# Patient Record
Sex: Female | Born: 2000
Health system: Southern US, Community
[De-identification: ages and names within clinical notes are randomized; demographics above are authoritative.]

## PROBLEM LIST (undated history)

## (undated) DIAGNOSIS — F419 Anxiety disorder, unspecified: Secondary | ICD-10-CM

## (undated) DIAGNOSIS — E559 Vitamin D deficiency, unspecified: Secondary | ICD-10-CM

## (undated) DIAGNOSIS — G44219 Episodic tension-type headache, not intractable: Secondary | ICD-10-CM

## (undated) DIAGNOSIS — G43009 Migraine without aura, not intractable, without status migrainosus: Secondary | ICD-10-CM

## (undated) DIAGNOSIS — F329 Major depressive disorder, single episode, unspecified: Secondary | ICD-10-CM

## (undated) DIAGNOSIS — F32A Depression, unspecified: Secondary | ICD-10-CM

## (undated) DIAGNOSIS — G4700 Insomnia, unspecified: Secondary | ICD-10-CM

## (undated) HISTORY — DX: Insomnia, unspecified: G47.00

## (undated) HISTORY — DX: Anxiety disorder, unspecified: F41.9

## (undated) HISTORY — DX: Migraine without aura, not intractable, without status migrainosus: G43.009

## (undated) HISTORY — DX: Vitamin D deficiency, unspecified: E55.9

## (undated) HISTORY — DX: Episodic tension-type headache, not intractable: G44.219

## (undated) HISTORY — DX: Depression, unspecified: F32.A

## (undated) HISTORY — PX: WISDOM TOOTH EXTRACTION: SHX21

---
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
## 2018-01-01 ENCOUNTER — Encounter (INDEPENDENT_AMBULATORY_CARE_PROVIDER_SITE_OTHER): Payer: Self-pay | Admitting: Family

## 2018-01-01 DIAGNOSIS — R4789 Other speech disturbances: Secondary | ICD-10-CM | POA: Insufficient documentation

## 2018-01-01 DIAGNOSIS — R4189 Other symptoms and signs involving cognitive functions and awareness: Secondary | ICD-10-CM | POA: Insufficient documentation

## 2018-01-01 DIAGNOSIS — R479 Unspecified speech disturbances: Secondary | ICD-10-CM | POA: Insufficient documentation

## 2018-01-06 ENCOUNTER — Telehealth: Payer: Self-pay | Admitting: Family

## 2018-01-06 ENCOUNTER — Encounter: Payer: Self-pay | Admitting: Psychology

## 2018-01-06 DIAGNOSIS — F410 Panic disorder [episodic paroxysmal anxiety] without agoraphobia: Secondary | ICD-10-CM

## 2018-01-06 DIAGNOSIS — R479 Unspecified speech disturbances: Secondary | ICD-10-CM

## 2018-01-06 DIAGNOSIS — R4789 Other speech disturbances: Secondary | ICD-10-CM

## 2018-01-06 DIAGNOSIS — R4189 Other symptoms and signs involving cognitive functions and awareness: Secondary | ICD-10-CM

## 2018-01-06 DIAGNOSIS — F411 Generalized anxiety disorder: Secondary | ICD-10-CM

## 2018-01-06 NOTE — Telephone Encounter (Signed)
°  Who's calling (name and relationship to patient) : Ayleah, Hofmeister (Self)  Best contact number: [phone] Judie Petit)  Provider they see: Inetta Fermo  Reason for call: Patient states that provider referred her to [facility] for testing however the times offered to her for testing is not convenient for her. She would like to know what other options she has.

## 2018-01-06 NOTE — Telephone Encounter (Signed)
L/M informing Vanessa Doyle that Vanessa Doyle is out of the office until Tuesday of next week. Informed her that Vanessa Doyle will return her call then. Invited her to call back with any other questions or concerns

## 2018-01-18 DIAGNOSIS — M9903 Segmental and somatic dysfunction of lumbar region: Secondary | ICD-10-CM | POA: Diagnosis not present

## 2018-01-18 DIAGNOSIS — M9901 Segmental and somatic dysfunction of cervical region: Secondary | ICD-10-CM | POA: Diagnosis not present

## 2018-01-18 DIAGNOSIS — M9902 Segmental and somatic dysfunction of thoracic region: Secondary | ICD-10-CM | POA: Diagnosis not present

## 2018-01-21 NOTE — Telephone Encounter (Signed)
I called and talked with Vanessa Doyle. She said that she was offered an appointment by Neurology for November and that the only days that they had openings were class days for her. She asked for referral elsewhere that she might be seen sooner and have more dates available. I will [facility] to [facility]. TG

## 2018-01-21 NOTE — Telephone Encounter (Signed)
°  Who's calling (name and relationship to patient) : Herma CarsonMoffitt, Keylani C (Self)  Best contact number: [phone] Judie Petit(M)  Provider they see: Inetta Fermoina  Reason for call: Requesting to speak with provider in regards to concerns below

## 2018-02-15 DIAGNOSIS — M79642 Pain in left hand: Secondary | ICD-10-CM | POA: Diagnosis not present

## 2018-02-19 DIAGNOSIS — M9902 Segmental and somatic dysfunction of thoracic region: Secondary | ICD-10-CM | POA: Diagnosis not present

## 2018-02-19 DIAGNOSIS — M9901 Segmental and somatic dysfunction of cervical region: Secondary | ICD-10-CM | POA: Diagnosis not present

## 2018-02-19 DIAGNOSIS — M9903 Segmental and somatic dysfunction of lumbar region: Secondary | ICD-10-CM | POA: Diagnosis not present

## 2018-03-16 ENCOUNTER — Ambulatory Visit: Payer: 59 | Admitting: Clinical

## 2018-03-23 DIAGNOSIS — R200 Anesthesia of skin: Secondary | ICD-10-CM | POA: Diagnosis not present

## 2018-03-29 DIAGNOSIS — M9902 Segmental and somatic dysfunction of thoracic region: Secondary | ICD-10-CM | POA: Diagnosis not present

## 2018-03-29 DIAGNOSIS — M9901 Segmental and somatic dysfunction of cervical region: Secondary | ICD-10-CM | POA: Diagnosis not present

## 2018-03-29 DIAGNOSIS — M9903 Segmental and somatic dysfunction of lumbar region: Secondary | ICD-10-CM | POA: Diagnosis not present

## 2018-04-09 DIAGNOSIS — L0390 Cellulitis, unspecified: Secondary | ICD-10-CM | POA: Diagnosis not present

## 2018-04-11 ENCOUNTER — Encounter (HOSPITAL_COMMUNITY): Payer: Self-pay | Admitting: Emergency Medicine

## 2018-04-11 ENCOUNTER — Other Ambulatory Visit: Payer: Self-pay

## 2018-04-11 ENCOUNTER — Emergency Department (HOSPITAL_COMMUNITY)
Admission: EM | Admit: 2018-04-11 | Discharge: 2018-04-11 | Disposition: A | Discharge status: Home / Self Care | Payer: 59 | Source: Home / Self Care | Attending: Emergency Medicine | Admitting: Emergency Medicine

## 2018-04-11 DIAGNOSIS — L03115 Cellulitis of right lower limb: Secondary | ICD-10-CM | POA: Insufficient documentation

## 2018-04-11 DIAGNOSIS — Z79899 Other long term (current) drug therapy: Secondary | ICD-10-CM

## 2018-04-11 LAB — CBC WITH DIFFERENTIAL/PLATELET
Abs Immature Granulocytes: 0 10*3/uL (ref 0.0–0.1)
Basophils Absolute: 0 10*3/uL (ref 0.0–0.1)
Basophils Relative: 0 %
EOS PCT: 2 %
Eosinophils Absolute: 0.2 10*3/uL (ref 0.0–0.7)
HCT: 39.2 % (ref 36.0–46.0)
Hemoglobin: 12.8 g/dL (ref 12.0–15.0)
Immature Granulocytes: 0 %
LYMPHS PCT: 15 %
Lymphs Abs: 1.6 10*3/uL (ref 0.7–4.0)
MCH: 28.4 pg (ref 26.0–34.0)
MCHC: 32.7 g/dL (ref 30.0–36.0)
MCV: 86.9 fL (ref 78.0–100.0)
MONO ABS: 0.8 10*3/uL (ref 0.1–1.0)
Monocytes Relative: 8 %
Neutro Abs: 7.8 10*3/uL — ABNORMAL HIGH (ref 1.7–7.7)
Neutrophils Relative %: 75 %
Platelets: 225 10*3/uL (ref 150–400)
RBC: 4.51 MIL/uL (ref 3.87–5.11)
RDW: 12.8 % (ref 11.5–15.5)
WBC: 10.4 10*3/uL (ref 4.0–10.5)

## 2018-04-11 LAB — COMPREHENSIVE METABOLIC PANEL
ALT: 17 U/L (ref 0–44)
AST: 16 U/L (ref 15–41)
Albumin: 3.7 g/dL (ref 3.5–5.0)
Alkaline Phosphatase: 78 U/L (ref 38–126)
Anion gap: 9 (ref 5–15)
BUN: 8 mg/dL (ref 6–20)
CALCIUM: 9.1 mg/dL (ref 8.9–10.3)
CO2: 23 mmol/L (ref 22–32)
Chloride: 106 mmol/L (ref 98–111)
Creatinine, Ser: 0.65 mg/dL (ref 0.44–1.00)
GFR calc Af Amer: >60 mL/min (ref >60)
GLUCOSE: 97 mg/dL (ref 70–99)
Potassium: 3.9 mmol/L (ref 3.5–5.1)
SODIUM: 138 mmol/L (ref 135–145)
TOTAL PROTEIN: 7.2 g/dL (ref 6.5–8.1)
Total Bilirubin: 0.5 mg/dL (ref 0.3–1.2)

## 2018-04-11 LAB — I-STAT CG4 LACTIC ACID, ED: LACTIC ACID, VENOUS: 0.9 mmol/L (ref 0.5–1.9)

## 2018-04-11 LAB — I-STAT BETA HCG BLOOD, ED (MC, WL, AP ONLY)

## 2018-04-11 MED ORDER — MORPHINE SULFATE (PF) 4 MG/ML IV SOLN
4.0000 mg | Freq: Once | INTRAVENOUS | Status: DC
  Filled 2018-04-11: qty 1

## 2018-04-11 MED ORDER — IBUPROFEN 800 MG PO TABS
800.0000 mg | ORAL_TABLET | Freq: Once | ORAL | Status: AC
  Administered 2018-04-11: 800 mg via ORAL
  Filled 2018-04-11: qty 1

## 2018-04-11 MED ORDER — VANCOMYCIN HCL IN DEXTROSE 1-5 GM/200ML-% IV SOLN
1000.0000 mg | Freq: Once | INTRAVENOUS | Status: DC
  Filled 2018-04-11: qty 200

## 2018-04-11 MED ORDER — ONDANSETRON HCL 4 MG/2ML IJ SOLN
4.0000 mg | Freq: Once | INTRAMUSCULAR | Status: DC
  Filled 2018-04-11: qty 2

## 2018-04-11 NOTE — ED Notes (Signed)
Ortho tech at bedside 

## 2018-04-11 NOTE — ED Provider Notes (Signed)
Medical screening examination/treatment/procedure(s) were conducted as a shared visit with non-physician practitioner(s) and myself.  I personally evaluated the patient during the encounter.  Patient diagnosed with cellulitis and started on antibiotics Friday morning.  She is had 7 doses of 300 mg of clindamycin and that time.  The redness has improved but she had worsening swelling and pain over the last 2 days the point she is having difficulty walking.  No fevers, nausea, vomiting.  She has had some body aches.  Sounds like the physician did a culture around the area and was positive for staph.  On my examination patient has market erythema around the wound on her right shin.  Area is warm and tender and edematous.  She has no posterior tenderness or significant calf pain with range of motion of her ankle.  Her heart rate is normal.  She is afebrile.  She overall appears well. Gust that it was still relatively early in the course of antibiotics at 2-1/2 days.  Also discussed that she may have any significant risk factors for significant worsening however she was also in the.  In between route expect the symptoms to slow down and before starting to resolve.  With this information I did not think the patient necessarily required admission to the hospital for failed antibiotics as she has had a full 3 days yet however offered them observation to make sure that it was get better before going home.  I discussed the pain control is the main issue so if we can get that under control they were willing to wait longer at home before being admitted to the hospital if necessary.  None     Kayla Weekes, Barbara Cower, MD 04/14/18 [phone]

## 2018-04-11 NOTE — ED Notes (Signed)
Patient verbalizes understanding of discharge instructions. Opportunity for questioning and answers were provided. Armband removed by staff, pt discharged from ED on crutches.  

## 2018-04-11 NOTE — ED Triage Notes (Addendum)
Pt had a bug bite on her right ankle. On Friday the area became red and swollen. She is on Clindamycin since Friday, with bacitracin ointment. They redness has spread and the surrounding tissue is swollen with scab like area noted. Pt also has pain when walking now. Pt was called by MD who did a wound culture and was told her wound was positive for Staff.

## 2018-04-11 NOTE — Progress Notes (Signed)
Orthopedic Tech Progress Note Patient Details:  Vanessa Doyle 05/01/2001 [id number]  Ortho Devices Type of Ortho Device: Crutches Ortho Device/Splint Interventions: Application   Post Interventions Patient Tolerated: Well Instructions Provided: Care of device   Nikki Dom 04/11/2018, 4:30 PM

## 2018-04-11 NOTE — ED Provider Notes (Signed)
[facility] EMERGENCY DEPARTMENT Provider Note   CSN: [id number] Arrival date & time: 04/11/18  1224     History   Chief Complaint Chief Complaint  Patient presents with  . Wound Check  . Cellulitis    HPI Vanessa Doyle is a 17 y.o. female who presents the emergency department for evaluation of right leg wound.  The patient saw her pediatrician on Friday April 09, 2018.  She had 2 areas of ulcerated tissue on the right lower extremity with some erythema, tenderness and surrounding petechial or violaceous tissue on a picture she showed me.  There was also some lymphangitis.  She was started on clindamycin and has had 7 doses.  She is having worsening erythema, swelling of the leg and is now unable to ambulate on the leg.  She has not had any fevers or chills.  She is never had anything like this before.  She is unsure if she was bitten by anything.  HPI  Past Medical History:  Diagnosis Date  . Episodic tension type headache 11/24/2014  . Migraine without aura and without status migrainosus, not intractable 11/24/2014  . Migraine without aura and without status migrainosus, not intractable     Patient Active Problem List   Diagnosis Date Noted  . Difficulty with speech 01/01/2018  . Word finding difficulty 01/01/2018  . Subjective memory complaints 01/01/2018  . Panic disorder 09/19/2017  . Hypotension 01/31/2015  . Anxiety state 11/24/2014  . Migraine without aura and without status migrainosus, not intractable 11/24/2014  . Episodic tension type headache 11/24/2014    No past surgical history on file.   OB History   None      Home Medications    Prior to Admission medications   Medication Sig Start Date End Date Taking? Authorizing Provider  acetaminophen (TYLENOL) 500 MG tablet Take 1,000 mg by mouth every 6 (six) hours as needed for mild pain or headache.    [provider]  diphenhydrAMINE (BENADRYL) 25 MG tablet Take 25 mg by mouth  every 6 (six) hours as needed.    [provider]  frovatriptan (FROVA) 2.5 MG tablet Take 2 tablets on day 1 of menstrual period. Then take 1 tablet twice per day on each successive day of menstrual period. 12/31/15   Elveria Rising, NP  ibuprofen (ADVIL,MOTRIN) 400 MG tablet Take 1 tablet (400 mg total) by mouth every 8 (eight) hours as needed for moderate pain. 05/09/17   Doreene Eland, MD  ketorolac (TORADOL) 10 MG tablet Take 2 tablets at onset, then take 1 tablet every 6 hours while awake and experiencing pain. 02/06/16   Elveria Rising, NP  promethazine (PHENERGAN) 25 MG tablet TAKE 1/2 TO 1 TABLET AT ONSET OF MIGRAINE WITH NAUSEA. MAY REPEAT IN 6 HOURS IF NEEDED 08/02/15   Elveria Rising, NP  tiZANidine (ZANAFLEX) 4 MG tablet Take 1 tablet at bedtime for pain. 12/06/15   Elveria Rising, NP  ZOMIG 5 MG nasal solution One spray into 1 nostril at onset of migraine 12/01/14   Elveria Rising, NP    Family History Family History  Problem Relation Age of Onset  . Heart disease Paternal Grandmother   . Migraines Father     Social History Social History   Tobacco Use  . Smoking status: Never Smoker  . Smokeless tobacco: Never Used  Substance Use Topics  . Alcohol use: No  . Drug use: No     Allergies   Patient has no  known allergies.   Review of Systems Review of Systems Ten systems reviewed and are negative for acute change, except as noted in the HPI.    Physical Exam Updated Vital Signs BP 123/66   Pulse 98   Temp 99.4 F (37.4 C)   Resp 17   LMP 03/18/2018   SpO2 98%   Physical Exam  Constitutional: She is oriented to person, place, and time. She appears well-developed and well-nourished. No distress.  HENT:  Head: Normocephalic and atraumatic.  Eyes: Conjunctivae are normal. No scleral icterus.  Neck: Normal range of motion.  Cardiovascular: Normal rate, regular rhythm and normal heart sounds. Exam reveals no gallop and no friction rub.    No murmur heard. Pulmonary/Chest: Effort normal and breath sounds normal. No respiratory distress.  Abdominal: Soft. Bowel sounds are normal. She exhibits no distension and no mass. There is no tenderness. There is no guarding.  Musculoskeletal:  Right lower extremity is exquisitely tender with swelling of the ankle, well demarcated erythema extending up to the inside of the medial knee without lymphangitis.  There is central crusting lesion over the middle of the infected area without palpable fluctuance.  Informal bedside ultrasound is negative for fluid collection and shows only cellulitic changes.  Neurological: She is alert and oriented to person, place, and time.  Skin: Skin is warm and dry. She is not diaphoretic.  Psychiatric: Her behavior is normal.  Nursing note and vitals reviewed.    ED Treatments / Results  Labs (all labs ordered are listed, but only abnormal results are displayed) Labs Reviewed  CBC WITH DIFFERENTIAL/PLATELET - Abnormal; Notable for the following components:      Result Value   Neutro Abs 7.8 (*)    All other components within normal limits  COMPREHENSIVE METABOLIC PANEL  I-STAT CG4 LACTIC ACID, ED  I-STAT BETA HCG BLOOD, ED ([facility], [facility], [facility] ONLY)  I-STAT CG4 LACTIC ACID, ED    EKG None  Radiology No results found.  Procedures Procedures (including critical care time)  Medications Ordered in ED Medications - No data to display   Initial Impression / Assessment and Plan / ED Course  I have reviewed the triage vital signs and the nursing notes.  Pertinent labs & imaging results that were available during my care of the patient were reviewed by me and considered in my medical decision making (see chart for details).     Patient seen and shared visit with Dr. Clayborne Dana.  Given that she is had only 7 doses of clindamycin he does not feel that she would need inpatient admission.  She was given a dose of vancomycin here.  Patient's able to ambulate with  crutches.  She and her parents appear to be reliable and we feel in a shared decision that she may go home and continue to observe the area of erythema for the next 24 hours.  She should return immediately for any worsening in her symptoms after that time because at that point she may need admission to the hospital.  Otherwise she should follow-up with her primary care physician in the next 2 days.  She is afebrile she does not have an elevated white blood cell count.  She may continue on her oral clindamycin.  She appears appropriate for discharge at this time.  Return cautions discussed Final Clinical Impressions(s) / ED Diagnoses   Final diagnoses:  None    ED Discharge Orders    None       Arthor Captain,  PA-C 04/11/18 1920    Marily Memos, MD 04/14/18 [phone]

## 2018-04-11 NOTE — Discharge Instructions (Signed)
Contact a health care provider if: °You have a fever. °Your symptoms do not improve within 1-2 days of starting treatment. °Your bone or joint underneath the infected area becomes painful after the skin has healed. °Your infection returns in the same area or another area. °You notice a swollen bump in the infected area. °You develop new symptoms. °You have a general ill feeling (malaise) with muscle aches and pains. °Get help right away if: °Your symptoms get worse. °You feel very sleepy. °You develop vomiting or diarrhea that persists. °You notice red streaks coming from the infected area. °Your red area gets larger or turns dark in color. °

## 2018-04-13 ENCOUNTER — Encounter (HOSPITAL_COMMUNITY): Payer: Self-pay | Admitting: Emergency Medicine

## 2018-04-13 ENCOUNTER — Inpatient Hospital Stay (HOSPITAL_COMMUNITY)
Admission: EM | Admit: 2018-04-13 | Discharge: 2018-04-14 | DRG: 603 | Disposition: A | Discharge status: Home / Self Care | Payer: 59 | Attending: Internal Medicine | Admitting: Internal Medicine

## 2018-04-13 DIAGNOSIS — G4700 Insomnia, unspecified: Secondary | ICD-10-CM | POA: Diagnosis present

## 2018-04-13 DIAGNOSIS — G43009 Migraine without aura, not intractable, without status migrainosus: Secondary | ICD-10-CM | POA: Diagnosis not present

## 2018-04-13 DIAGNOSIS — M79609 Pain in unspecified limb: Secondary | ICD-10-CM | POA: Diagnosis not present

## 2018-04-13 DIAGNOSIS — Z8249 Family history of ischemic heart disease and other diseases of the circulatory system: Secondary | ICD-10-CM

## 2018-04-13 DIAGNOSIS — Z1629 Resistance to other single specified antibiotic: Secondary | ICD-10-CM | POA: Diagnosis present

## 2018-04-13 DIAGNOSIS — G44211 Episodic tension-type headache, intractable: Secondary | ICD-10-CM | POA: Diagnosis present

## 2018-04-13 DIAGNOSIS — Z9119 Patient's noncompliance with other medical treatment and regimen: Secondary | ICD-10-CM | POA: Diagnosis not present

## 2018-04-13 DIAGNOSIS — F411 Generalized anxiety disorder: Secondary | ICD-10-CM | POA: Diagnosis present

## 2018-04-13 DIAGNOSIS — M7989 Other specified soft tissue disorders: Secondary | ICD-10-CM | POA: Diagnosis not present

## 2018-04-13 DIAGNOSIS — L03115 Cellulitis of right lower limb: Principal | ICD-10-CM | POA: Diagnosis present

## 2018-04-13 DIAGNOSIS — G44219 Episodic tension-type headache, not intractable: Secondary | ICD-10-CM | POA: Diagnosis present

## 2018-04-13 DIAGNOSIS — B9561 Methicillin susceptible Staphylococcus aureus infection as the cause of diseases classified elsewhere: Secondary | ICD-10-CM | POA: Diagnosis not present

## 2018-04-13 DIAGNOSIS — L0390 Cellulitis, unspecified: Secondary | ICD-10-CM | POA: Diagnosis present

## 2018-04-13 LAB — COMPREHENSIVE METABOLIC PANEL
ALT: 17 U/L (ref 0–44)
ANION GAP: 9 (ref 5–15)
AST: 15 U/L (ref 15–41)
Albumin: 3.8 g/dL (ref 3.5–5.0)
Alkaline Phosphatase: 73 U/L (ref 38–126)
BUN: 15 mg/dL (ref 6–20)
CALCIUM: 9.5 mg/dL (ref 8.9–10.3)
CHLORIDE: 108 mmol/L (ref 98–111)
CO2: 25 mmol/L (ref 22–32)
Creatinine, Ser: 0.55 mg/dL (ref 0.44–1.00)
Glucose, Bld: 92 mg/dL (ref 70–99)
Potassium: 3.8 mmol/L (ref 3.5–5.1)
Sodium: 142 mmol/L (ref 135–145)
Total Bilirubin: 0.4 mg/dL (ref 0.3–1.2)
Total Protein: 7.2 g/dL (ref 6.5–8.1)

## 2018-04-13 LAB — CBC WITH DIFFERENTIAL/PLATELET
BASOS PCT: 0 %
Basophils Absolute: 0 10*3/uL (ref 0.0–0.1)
Eosinophils Absolute: 0.2 10*3/uL (ref 0.0–0.7)
Eosinophils Relative: 3 %
HCT: 37.4 % (ref 36.0–46.0)
HEMOGLOBIN: 12.6 g/dL (ref 12.0–15.0)
Lymphocytes Relative: 36 %
Lymphs Abs: 2.6 10*3/uL (ref 0.7–4.0)
MCH: 28.4 pg (ref 26.0–34.0)
MCHC: 33.7 g/dL (ref 30.0–36.0)
MCV: 84.2 fL (ref 78.0–100.0)
Monocytes Absolute: 0.4 10*3/uL (ref 0.1–1.0)
Monocytes Relative: 6 %
NEUTROS PCT: 55 %
Neutro Abs: 3.8 10*3/uL (ref 1.7–7.7)
Platelets: 305 10*3/uL (ref 150–400)
RBC: 4.44 MIL/uL (ref 3.87–5.11)
RDW: 13.1 % (ref 11.5–15.5)
WBC: 7.1 10*3/uL (ref 4.0–10.5)

## 2018-04-13 LAB — I-STAT CG4 LACTIC ACID, ED: LACTIC ACID, VENOUS: 1.27 mmol/L (ref 0.5–1.9)

## 2018-04-13 LAB — I-STAT BETA HCG BLOOD, ED (MC, WL, AP ONLY): I-stat hCG, quantitative: <5 m[IU]/mL (ref <5)

## 2018-04-13 MED ORDER — CEFAZOLIN SODIUM-DEXTROSE 1-4 GM/50ML-% IV SOLN
1.0000 g | Freq: Once | INTRAVENOUS | Status: AC
  Administered 2018-04-13: 1 g via INTRAVENOUS
  Filled 2018-04-13: qty 50

## 2018-04-13 MED ORDER — ONDANSETRON HCL 4 MG/2ML IJ SOLN
4.0000 mg | Freq: Once | INTRAMUSCULAR | Status: AC
  Administered 2018-04-13: 4 mg via INTRAVENOUS
  Filled 2018-04-13: qty 2

## 2018-04-13 MED ORDER — HYDROMORPHONE HCL 1 MG/ML IJ SOLN
0.5000 mg | Freq: Once | INTRAMUSCULAR | Status: AC
  Administered 2018-04-13: 0.5 mg via INTRAVENOUS
  Filled 2018-04-13: qty 1

## 2018-04-13 MED ORDER — VANCOMYCIN HCL IN DEXTROSE 1-5 GM/200ML-% IV SOLN
1000.0000 mg | Freq: Once | INTRAVENOUS | Status: DC
  Administered 2018-04-14: 1000 mg via INTRAVENOUS
  Filled 2018-04-13: qty 200

## 2018-04-13 MED ORDER — VANCOMYCIN HCL 10 G IV SOLR
2000.0000 mg | Freq: Once | INTRAVENOUS | Status: DC
  Filled 2018-04-13: qty 2000

## 2018-04-13 NOTE — H&P (Signed)
History and Physical    Vanessa Doyle ZOX:[id number] DOB: 09-Sep-2000 DOA: 04/13/2018  PCP: Maryellen Pile, MD   Patient coming from: Home  Chief Complaint: Leg pain, swelling  HPI: Vanessa Doyle is a 17 y.o. female with medical history significant for migraines, who presented to the ED with complaints of right lower extremity pain redness and swelling, for over a week duration.  Patient also reports subjective fevers.  She reports an initial bump, it was itchy, (?probably from a bug bite-bug actually not seen), which progressed to 2 wounds.  Reports purulent and will treat drainage from both wounds.  She denies vomiting, poor p.o. intake or diarrhea. Saw her PCP, 29/8/19, was prescribed clindamycin for cellulitis.  And also presented to the ED 04/11/2018 -charge to continue clindamycin as prescribed by PCP. But cultures obtained in PCP's office (appears superficial wound cultures)-grew staph resistant to clindamycin, and with persistent symptoms patient was told to come to the ED.  ED Course: Stable vitals.  WBC 7.1.  Lactic acid 1.2.  Blood cultures were obtained, IV vancomycin and cefazolin started in the ED. Hospitalist called to admit for cellulitis, after failure of outpatient treatment.  Review of Systems: As per HPI all other systems reviewed and negative.  Past Medical History:  Diagnosis Date  . Episodic tension type headache 11/24/2014  . Migraine without aura and without status migrainosus, not intractable 11/24/2014  . Migraine without aura and without status migrainosus, not intractable     History reviewed. No pertinent surgical history.   reports that she has never smoked. She has never used smokeless tobacco. She reports that she does not drink alcohol or use drugs.  No Known Allergies  Family History  Problem Relation Age of Onset  . Heart disease Paternal Grandmother   . Migraines Father     Prior to Admission medications   Medication Sig Start Date End Date  Taking? Authorizing Provider  BELSOMRA 10 MG TABS Take 10 mg by mouth at bedtime. 02/17/18  Yes [provider]  ibuprofen (ADVIL,MOTRIN) 200 MG tablet Take 200-400 mg by mouth every 6 (six) hours as needed for fever or moderate pain.   Yes [provider]  mupirocin ointment (BACTROBAN) 2 % Apply 1 application topically 3 (three) times daily as needed (for one week).  04/09/18  Yes [provider]  sulfamethoxazole-trimethoprim (BACTRIM DS,SEPTRA DS) 800-160 MG tablet Take 2 tablets by mouth 2 (two) times daily.  04/13/18  Yes [provider]  ibuprofen (ADVIL,MOTRIN) 400 MG tablet Take 1 tablet (400 mg total) by mouth every 8 (eight) hours as needed for moderate pain. Patient not taking: Reported on 04/13/2018 05/09/17   Doreene Eland, MD  ketorolac (TORADOL) 10 MG tablet Take 2 tablets at onset, then take 1 tablet every 6 hours while awake and experiencing pain. Patient not taking: Reported on 04/13/2018 02/06/16   Elveria Rising, NP  promethazine (PHENERGAN) 25 MG tablet TAKE 1/2 TO 1 TABLET AT ONSET OF MIGRAINE WITH NAUSEA. MAY REPEAT IN 6 HOURS IF NEEDED Patient not taking: Reported on 04/13/2018 08/02/15   Elveria Rising, NP  tiZANidine (ZANAFLEX) 4 MG tablet Take 1 tablet at bedtime for pain. Patient not taking: Reported on 04/13/2018 12/06/15   Elveria Rising, NP    Physical Exam: Vitals:   04/13/18 1853 04/13/18 1854  BP: 129/67   Pulse: 85   Resp: 17   Temp: 99.4 F (37.4 C)   TempSrc: Oral   SpO2: 99%   Weight:  90 kg  Height:  5' 3.25" (1.607 m)    Constitutional: NAD, calm, comfortable Vitals:   04/13/18 1853 04/13/18 1854  BP: 129/67   Pulse: 85   Resp: 17   Temp: 99.4 F (37.4 C)   TempSrc: Oral   SpO2: 99%   Weight:  90 kg  Height:  5' 3.25" (1.607 m)   Eyes: PERRL, lids and conjunctivae normal ENMT: Mucous membranes are moist. Posterior pharynx clear of any exudate or lesions.  Neck: normal, supple, no masses, no  thyromegaly Respiratory: clear to auscultation bilaterally, no wheezing, no crackles. Normal respiratory effort. No accessory muscle use.  Cardiovascular: Regular rate and rhythm, no murmurs / rubs / gallops.  Abdomen: no tenderness, no masses palpated. No hepatosplenomegaly. Bowel sounds positive.  Musculoskeletal: no clubbing / cyanosis. No joint deformity upper and lower extremities. Good ROM, no contractures. Skin:  2  ~2cm long wounds, scabbed over anterior right leg, without drainage of purulence, improving lower extremity erythema-demarcated but with persistent swelling (compared with pics on phone), tenderness Neurologic: CN 2-12 grossly intact. Strength 5/5 in all 4.  Psychiatric: Normal judgment and insight. Alert and oriented x 3. Normal mood.      Labs on Admission: I have personally reviewed following labs and imaging studies  CBC: Recent Labs  Lab 04/11/18 1239 04/13/18 2001  WBC 10.4 7.1  NEUTROABS 7.8* 3.8  HGB 12.8 12.6  HCT 39.2 37.4  MCV 86.9 84.2  PLT 225 305   Basic Metabolic Panel: Recent Labs  Lab 04/11/18 1239 04/13/18 2001  NA 138 142  K 3.9 3.8  CL 106 108  CO2 23 25  GLUCOSE 97 92  BUN 8 15  CREATININE 0.65 0.55  CALCIUM 9.1 9.5   Liver Function Tests: Recent Labs  Lab 04/11/18 1239 04/13/18 2001  AST 16 15  ALT 17 17  ALKPHOS 78 73  BILITOT 0.5 0.4  PROT 7.2 7.2  ALBUMIN 3.7 3.8   Urine analysis:    Component Value Date/Time   COLORURINE YELLOW 10/13/2013 0014   APPEARANCEUR CLEAR 10/13/2013 0014   LABSPEC 1.015 05/09/2017 1729   PHURINE 7.5 05/09/2017 1729   GLUCOSEU NEGATIVE 05/09/2017 1729   HGBUR NEGATIVE 05/09/2017 1729   BILIRUBINUR NEGATIVE 05/09/2017 1729   KETONESUR NEGATIVE 05/09/2017 1729   PROTEINUR NEGATIVE 05/09/2017 1729   UROBILINOGEN 0.2 05/09/2017 1729   NITRITE NEGATIVE 05/09/2017 1729   LEUKOCYTESUR NEGATIVE 05/09/2017 1729    Radiological Exams on Admission: No results found.  EKG:  None  Assessment/Plan Active Problems:   Migraine without aura and without status migrainosus, not intractable   Cellulitis  Cellulitis- likely 2/2 to wounds, with reported purulence, and subjective fevers.  WBC 7.1.  Lactic acid 1.2. Does not meet sepsis criteria. Failed outpt treatment with clindamycin. Bactrim prescribed today. IV Vanco and cefazolin started in ED. Cultures (likely from wound ) PCPs office-staph aureus resistant to clindamycin, sensitive to Bactrim tetracycline, vancomycin, Cipro, oxacillin. -Will continue with IV vancomycin only started in ED -Follow-up blood cultures drawn in ED -Rule out lower extremity DVT with venous Dopplers -BMP a.m, while on antibiotics  Migraines-stable.  Patient reports her prior antimigraine medications, including Frovotriptan have been discontinued. -Continue home Belsomra for insomnia  DVT prophylaxis: Lovenox Code Status: full Family Communication: Both parents father mother at bedside Disposition Plan: Per rounding team Consults called: None Admission status: Inpatient, MedSurg.    Onnie Boer MD [facility] Pager [phone] From 6PM-2AM.  Otherwise please contact night-coverage  [email or web address] Password [facility]  04/13/2018, 11:47 PM

## 2018-04-13 NOTE — ED Provider Notes (Signed)
[facility]-EMERGENCY DEPT Provider Note   CSN: [id number] Arrival date & time: 04/13/18  1827     History   Chief Complaint Chief Complaint  Patient presents with  . leg infection    HPI Vanessa Doyle is a 17 y.o. female.  Patient presents to ED for re-evaluation of right LE infection. Initially seen by her doctor Donnie Coffin) and started on Clindamycin around the 29th of August for cellulitis though secondary to an insect bite. She came to the emergency department on 9/1 after having a total of 6 doses of Clinda with worsening symptoms of swelling and spreading erythema proximally. She had normal VS and labs at that time and was sent home to continue Clindamycin. She has maintained a low grade temperature throughout infection (99 to 100). She has had nausea without vomiting. She was contacted today by Dr. Renelda Loma office informing her that her cultures grew a Clindamycin resistant staph and to return to the hospital. Today she states the pain has gotten worse.  The history is provided by the patient and a parent.    Past Medical History:  Diagnosis Date  . Episodic tension type headache 11/24/2014  . Migraine without aura and without status migrainosus, not intractable 11/24/2014  . Migraine without aura and without status migrainosus, not intractable     Patient Active Problem List   Diagnosis Date Noted  . Difficulty with speech 01/01/2018  . Word finding difficulty 01/01/2018  . Subjective memory complaints 01/01/2018  . Panic disorder 09/19/2017  . Hypotension 01/31/2015  . Anxiety state 11/24/2014  . Migraine without aura and without status migrainosus, not intractable 11/24/2014  . Episodic tension type headache 11/24/2014    History reviewed. No pertinent surgical history.   OB History   None      Home Medications    Prior to Admission medications   Medication Sig Start Date End Date Taking? Authorizing Provider  BELSOMRA 10 MG TABS Take 10  mg by mouth at bedtime. 02/17/18  Yes [provider]  ibuprofen (ADVIL,MOTRIN) 200 MG tablet Take 200-400 mg by mouth every 6 (six) hours as needed for fever or moderate pain.   Yes [provider]  mupirocin ointment (BACTROBAN) 2 % Apply 1 application topically 3 (three) times daily as needed (for one week).  04/09/18  Yes [provider]  sulfamethoxazole-trimethoprim (BACTRIM DS,SEPTRA DS) 800-160 MG tablet Take 2 tablets by mouth 2 (two) times daily.  04/13/18  Yes [provider]  frovatriptan (FROVA) 2.5 MG tablet Take 2 tablets on day 1 of menstrual period. Then take 1 tablet twice per day on each successive day of menstrual period. Patient not taking: Reported on 04/13/2018 12/31/15   Elveria Rising, NP  ibuprofen (ADVIL,MOTRIN) 400 MG tablet Take 1 tablet (400 mg total) by mouth every 8 (eight) hours as needed for moderate pain. Patient not taking: Reported on 04/13/2018 05/09/17   Doreene Eland, MD  ketorolac (TORADOL) 10 MG tablet Take 2 tablets at onset, then take 1 tablet every 6 hours while awake and experiencing pain. Patient not taking: Reported on 04/13/2018 02/06/16   Elveria Rising, NP  promethazine (PHENERGAN) 25 MG tablet TAKE 1/2 TO 1 TABLET AT ONSET OF MIGRAINE WITH NAUSEA. MAY REPEAT IN 6 HOURS IF NEEDED Patient not taking: Reported on 04/13/2018 08/02/15   Elveria Rising, NP  tiZANidine (ZANAFLEX) 4 MG tablet Take 1 tablet at bedtime for pain. Patient not taking: Reported on 04/13/2018 12/06/15   Elveria Rising, NP  ZOMIG 5 MG nasal solution One spray into 1 nostril at onset of migraine Patient not taking: Reported on 04/13/2018 12/01/14   Elveria Rising, NP    Family History Family History  Problem Relation Age of Onset  . Heart disease Paternal Grandmother   . Migraines Father     Social History Social History   Tobacco Use  . Smoking status: Never Smoker  . Smokeless tobacco: Never Used  Substance Use Topics  . Alcohol  use: No  . Drug use: No     Allergies   Patient has no known allergies.   Review of Systems Review of Systems  Constitutional: Positive for fever.  Respiratory: Negative.   Cardiovascular: Negative.   Gastrointestinal: Positive for nausea. Negative for vomiting.  Musculoskeletal:       See HPI.  Skin: Positive for color change and wound.  Neurological: Negative.      Physical Exam Updated Vital Signs BP 129/67 (BP Location: Left Arm)   Pulse 85   Temp 99.4 F (37.4 C) (Oral)   Resp 17   Ht 5' 3.25" (1.607 m)   Wt 90 kg   LMP 03/18/2018   SpO2 99%   BMI 34.86 kg/m   Physical Exam  Constitutional: She is oriented to person, place, and time. She appears well-developed and well-nourished.  HENT:  Head: Normocephalic.  Neck: Normal range of motion. Neck supple.  Cardiovascular: Normal rate.  Pulmonary/Chest: Effort normal.  Abdominal: Soft. Bowel sounds are normal. There is no tenderness. There is no rebound and no guarding.  Musculoskeletal: Normal range of motion.  Right lower leg swollen anteriorly extending to foot and surrounding wound on distal shin. No drainage or bleeding from the wound. Erythema extends to proximal lower leg. There is significant tenderness to lower leg and foot.   Neurological: She is alert and oriented to person, place, and time. No sensory deficit.  Skin: Skin is warm and dry. There is erythema.  Psychiatric: She has a normal mood and affect.     ED Treatments / Results  Labs (all labs ordered are listed, but only abnormal results are displayed) Labs Reviewed  CULTURE, BLOOD (ROUTINE X 2)  CULTURE, BLOOD (ROUTINE X 2)  COMPREHENSIVE METABOLIC PANEL  CBC WITH DIFFERENTIAL/PLATELET  I-STAT CG4 LACTIC ACID, ED  I-STAT BETA HCG BLOOD, ED ([facility], [facility], [facility] ONLY)  I-STAT CG4 LACTIC ACID, ED   Results for orders placed or performed during the hospital encounter of 04/13/18  Comprehensive metabolic panel  Result Value Ref Range   Sodium 142  135 - 145 mmol/L   Potassium 3.8 3.5 - 5.1 mmol/L   Chloride 108 98 - 111 mmol/L   CO2 25 22 - 32 mmol/L   Glucose, Bld 92 70 - 99 mg/dL   BUN 15 6 - 20 mg/dL   Creatinine, Ser 2.95 0.44 - 1.00 mg/dL   Calcium 9.5 8.9 - 62.1 mg/dL   Total Protein 7.2 6.5 - 8.1 g/dL   Albumin 3.8 3.5 - 5.0 g/dL   AST 15 15 - 41 U/L   ALT 17 0 - 44 U/L   Alkaline Phosphatase 73 38 - 126 U/L   Total Bilirubin 0.4 0.3 - 1.2 mg/dL   GFR calc non Af Amer >60 >60 mL/min   GFR calc Af Amer >60 >60 mL/min   Anion gap 9 5 - 15  CBC with Differential  Result Value Ref Range   WBC 7.1 4.0 - 10.5 K/uL   RBC 4.44 3.87 -  5.11 MIL/uL   Hemoglobin 12.6 12.0 - 15.0 g/dL   HCT 61.4 70.9 - 29.5 %   MCV 84.2 78.0 - 100.0 fL   MCH 28.4 26.0 - 34.0 pg   MCHC 33.7 30.0 - 36.0 g/dL   RDW 74.7 34.0 - 37.0 %   Platelets 305 150 - 400 K/uL   Neutrophils Relative % 55 %   Neutro Abs 3.8 1.7 - 7.7 K/uL   Lymphocytes Relative 36 %   Lymphs Abs 2.6 0.7 - 4.0 K/uL   Monocytes Relative 6 %   Monocytes Absolute 0.4 0.1 - 1.0 K/uL   Eosinophils Relative 3 %   Eosinophils Absolute 0.2 0.0 - 0.7 K/uL   Basophils Relative 0 %   Basophils Absolute 0.0 0.0 - 0.1 K/uL  I-Stat CG4 Lactic Acid, ED  Result Value Ref Range   Lactic Acid, Venous 1.27 0.5 - 1.9 mmol/L  I-Stat beta hCG blood, ED  Result Value Ref Range   I-stat hCG, quantitative <5.0 <5 mIU/mL   Comment 3            EKG None  Radiology No results found.  Procedures Procedures (including critical care time)  Medications Ordered in ED Medications  ceFAZolin (ANCEF) IVPB 1 g/50 mL premix (has no administration in time range)  vancomycin (VANCOCIN) IVPB 1000 mg/200 mL premix (has no administration in time range)  ondansetron (ZOFRAN) injection 4 mg (has no administration in time range)  HYDROmorphone (DILAUDID) injection 0.5 mg (has no administration in time range)     Initial Impression / Assessment and Plan / ED Course  I have reviewed the triage  vital signs and the nursing notes.  Pertinent labs & imaging results that were available during my care of the patient were reviewed by me and considered in my medical decision making (see chart for details).     Patient returns to ED with persistent/worsening infection of right lower extremity. Started on Clindamycin on 8/29, told today her cultures show Clinda resistance. Persistent low grade fever, worsening pain and redness.   Patient is nontoxic in appearance. It is felt she would benefit from inpatient IV antibiotics for staph infection to lower leg after failure of outpatient treatment. Vanc and Cefazolin started. Labs reassuring. Patient and parents are agreeable to and prefer admission.    Final Clinical Impressions(s) / ED Diagnoses   Final diagnoses:  None   1. Cellulitis right LE 2. Failure of outpatient treatment  ED Discharge Orders    None       Elpidio Anis, Cordelia Poche 04/13/18 2256    Gwyneth Sprout, MD 04/14/18 [id number]

## 2018-04-13 NOTE — ED Triage Notes (Signed)
Pt sent by PCP to ED for worsening leg infection that isnt responding to oral antibiotics. Pt also positive for staph infection.

## 2018-04-14 ENCOUNTER — Other Ambulatory Visit: Payer: Self-pay

## 2018-04-14 ENCOUNTER — Inpatient Hospital Stay (HOSPITAL_COMMUNITY): Payer: 59

## 2018-04-14 DIAGNOSIS — G43009 Migraine without aura, not intractable, without status migrainosus: Secondary | ICD-10-CM

## 2018-04-14 DIAGNOSIS — M79609 Pain in unspecified limb: Secondary | ICD-10-CM

## 2018-04-14 DIAGNOSIS — M7989 Other specified soft tissue disorders: Secondary | ICD-10-CM

## 2018-04-14 DIAGNOSIS — L03115 Cellulitis of right lower limb: Principal | ICD-10-CM

## 2018-04-14 LAB — BASIC METABOLIC PANEL
ANION GAP: 11 (ref 5–15)
BUN: 10 mg/dL (ref 6–20)
CALCIUM: 8.9 mg/dL (ref 8.9–10.3)
CO2: 22 mmol/L (ref 22–32)
Chloride: 107 mmol/L (ref 98–111)
Creatinine, Ser: 0.56 mg/dL (ref 0.44–1.00)
GLUCOSE: 97 mg/dL (ref 70–99)
Potassium: 3.7 mmol/L (ref 3.5–5.1)
SODIUM: 140 mmol/L (ref 135–145)

## 2018-04-14 LAB — HIV ANTIBODY (ROUTINE TESTING W REFLEX): HIV SCREEN 4TH GENERATION: NONREACTIVE

## 2018-04-14 MED ORDER — IBUPROFEN 200 MG PO TABS
600.0000 mg | ORAL_TABLET | Freq: Three times a day (TID) | ORAL | Status: DC
  Administered 2018-04-14 (×2): 600 mg via ORAL
  Filled 2018-04-14 (×2): qty 3

## 2018-04-14 MED ORDER — VANCOMYCIN HCL 10 G IV SOLR
1250.0000 mg | Freq: Two times a day (BID) | INTRAVENOUS | Status: DC
  Administered 2018-04-14: 1250 mg via INTRAVENOUS
  Filled 2018-04-14 (×2): qty 1250

## 2018-04-14 MED ORDER — ENOXAPARIN SODIUM 40 MG/0.4ML ~~LOC~~ SOLN: 40.0000 mg | Freq: Every day | SUBCUTANEOUS | Status: DC

## 2018-04-14 MED ORDER — VANCOMYCIN HCL IN DEXTROSE 1-5 GM/200ML-% IV SOLN
1000.0000 mg | Freq: Once | INTRAVENOUS | Status: AC
  Administered 2018-04-14: 1000 mg via INTRAVENOUS
  Filled 2018-04-14: qty 200

## 2018-04-14 MED ORDER — TRAMADOL HCL 50 MG PO TABS: 50.0000 mg | ORAL_TABLET | Freq: Four times a day (QID) | ORAL | 0 refills | Status: DC | PRN

## 2018-04-14 MED ORDER — ONDANSETRON HCL 4 MG/2ML IJ SOLN
4.0000 mg | Freq: Four times a day (QID) | INTRAMUSCULAR | Status: DC | PRN
  Administered 2018-04-14: 4 mg via INTRAVENOUS
  Filled 2018-04-14: qty 2

## 2018-04-14 MED ORDER — ACETAMINOPHEN 325 MG PO TABS: 650.0000 mg | ORAL_TABLET | Freq: Four times a day (QID) | ORAL | Status: DC | PRN

## 2018-04-14 MED ORDER — ACETAMINOPHEN 325 MG PO TABS: 650.0000 mg | ORAL_TABLET | Freq: Four times a day (QID) | ORAL | Status: AC | PRN

## 2018-04-14 MED ORDER — ONDANSETRON HCL 4 MG PO TABS
4.0000 mg | ORAL_TABLET | Freq: Four times a day (QID) | ORAL | Status: DC | PRN
  Filled 2018-04-14: qty 1

## 2018-04-14 MED ORDER — SUVOREXANT 10 MG PO TABS: 10.0000 mg | ORAL_TABLET | Freq: Every day | ORAL | Status: DC

## 2018-04-14 NOTE — Progress Notes (Signed)
*  Preliminary Results* Right lower extremity venous duplex completed. Right lower extremity is negative for deep vein thrombosis. There is no evidence of right Baker's cyst.  04/14/2018 8:25 AM  Blanch Media

## 2018-04-14 NOTE — Discharge Summary (Signed)
Physician Discharge Summary  Vanessa Doyle LZJ:[id number] DOB: February 02, 2001 DOA: 04/13/2018  PCP: Maryellen Pile, MD  Admit date: 04/13/2018 Discharge date: 04/14/2018  Time spent: 60 minutes  Recommendations for Outpatient Follow-up:  1.  Follow-up with Maryellen Pile, MD in 1 to 2 weeks.  On follow-up patient's right lower extremity cellulitis will need to be reassessed.  Patient will need a basic metabolic profile done to follow-up on electrolytes and renal function.    Discharge Diagnoses:  Principal Problem:   Cellulitis Active Problems:   Anxiety state   Migraine without aura and without status migrainosus, not intractable   Episodic tension type headache   Discharge Condition: Stable and improved  Diet recommendation: Regular  Filed Weights   04/13/18 1854 04/14/18 0318  Weight: 90 kg 90.2 kg    History of present illness:  Per Dr. Laural Golden is a 17 y.o. female with medical history significant for migraines, who presented to the ED with complaints of right lower extremity pain redness and swelling, for over a week duration.  Patient also reported subjective fevers.  She reports an initial bump, it was itchy, (?probably from a bug bite-bug actually not seen), which progressed to 2 wounds.  Reports purulent and will treat drainage from both wounds.  She denies vomiting, poor p.o. intake or diarrhea. Saw her PCP, 29/8/19, was prescribed clindamycin for cellulitis.  And also presented to the ED 04/11/2018 -charge to continue clindamycin as prescribed by PCP. But cultures obtained in PCP's office (appears superficial wound cultures)-grew staph resistant to clindamycin, and with persistent symptoms patient was told to come to the ED.  ED Course: Stable vitals.  WBC 7.1.  Lactic acid 1.2.  Blood cultures were obtained, IV vancomycin and cefazolin started in the ED. Hospitalist called to admit for cellulitis, after failure of outpatient treatment.  Hospital Course:  #1 acute  right lower extremity cellulitis Likely secondary to wounds noted with some reported purulence and subjective fevers prior to admission.  WBC on admission was 7.1.  Lactic acid was 1.2.  Patient noted to have failed outpatient treatment on clindamycin.  It is noted that wound cultures which were done at PCPs office were resistant to clindamycin.  Patient was admitted receive a dose of IV vancomycin and cefazolin in the ED and subsequently placed on IV vancomycin during the hospitalization.  It was noted from paperwork from PCPs office that family had that cultures were positive for MSSA resistant to clindamycin and sensitive to Bactrim, tetracyclines, vancomycin, ciprofloxacin and oxacillin.  Blood cultures were drawn which were pending at time of discharge.  Patient remained afebrile.  Lower extremity Dopplers done were negative for DVT.  Patient improved clinically during the hospitalization and will be discharged home on oral Bactrim DS twice daily which was prescribed by PCP prior to admission.  Outpatient follow-up with PCP in 1 week.  2.  Migraines Remained stable throughout the hospitalization.  Procedures:  Lower extremity Dopplers 04/14/2018    Consultations:  None  Discharge Exam: Vitals:   04/14/18 0318 04/14/18 1401  BP: (!) 116/58 (!) 100/51  Pulse: 67 78  Resp: 18 16  Temp: 98.2 F (36.8 C) 97.9 F (36.6 C)  SpO2: 97% 99%    General: NAD Cardiovascular: RRR Respiratory: CTAB Extremities: Right lower extremity with decreasing erythema, decreased warmth, some tenderness to palpation.  Discharge Instructions   Discharge Instructions    Diet general   Complete by:  As directed    Increase activity slowly  Complete by:  As directed      Allergies as of 04/14/2018   No Known Allergies     Medication List    STOP taking these medications   ibuprofen 200 MG tablet Commonly known as:  ADVIL,MOTRIN   ibuprofen 400 MG tablet Commonly known as:  ADVIL,MOTRIN    ketorolac 10 MG tablet Commonly known as:  TORADOL     TAKE these medications   acetaminophen 325 MG tablet Commonly known as:  TYLENOL Take 2 tablets (650 mg total) by mouth every 6 (six) hours as needed for fever or mild pain.   BELSOMRA 10 MG Tabs Generic drug:  Suvorexant Take 10 mg by mouth at bedtime.   mupirocin ointment 2 % Commonly known as:  BACTROBAN Apply 1 application topically 3 (three) times daily as needed (for one week).   promethazine 25 MG tablet Commonly known as:  PHENERGAN TAKE 1/2 TO 1 TABLET AT ONSET OF MIGRAINE WITH NAUSEA. MAY REPEAT IN 6 HOURS IF NEEDED   sulfamethoxazole-trimethoprim 800-160 MG tablet Commonly known as:  BACTRIM DS,SEPTRA DS Take 2 tablets by mouth 2 (two) times daily.   tiZANidine 4 MG tablet Commonly known as:  ZANAFLEX Take 1 tablet at bedtime for pain.   traMADol 50 MG tablet Commonly known as:  ULTRAM Take 1 tablet (50 mg total) by mouth every 6 (six) hours as needed.      No Known Allergies Follow-up Information    Maryellen Pile, MD. Schedule an appointment as soon as possible for a visit in 1 week(s).   Specialty:  Pediatrics Why:  f/u in 1-2 weeks. Contact information: [address] [place] [address] [phone]            The results of significant diagnostics from this hospitalization (including imaging, microbiology, ancillary and laboratory) are listed below for reference.    Significant Diagnostic Studies: No results found.  Microbiology: No results found for this or any previous visit (from the past 240 hour(s)).   Labs: Basic Metabolic Panel: Recent Labs  Lab 04/11/18 1239 04/13/18 2001 04/14/18 0550  NA 138 142 140  K 3.9 3.8 3.7  CL 106 108 107  CO2 23 25 22   GLUCOSE 97 92 97  BUN 8 15 10   CREATININE 0.65 0.55 0.56  CALCIUM 9.1 9.5 8.9   Liver Function Tests: Recent Labs  Lab 04/11/18 1239 04/13/18 2001  AST 16 15  ALT 17 17  ALKPHOS 78 73  BILITOT 0.5 0.4   PROT 7.2 7.2  ALBUMIN 3.7 3.8   No results for input(s): LIPASE, AMYLASE in the last 168 hours. No results for input(s): AMMONIA in the last 168 hours. CBC: Recent Labs  Lab 04/11/18 1239 04/13/18 2001  WBC 10.4 7.1  NEUTROABS 7.8* 3.8  HGB 12.8 12.6  HCT 39.2 37.4  MCV 86.9 84.2  PLT 225 305   Cardiac Enzymes: No results for input(s): CKTOTAL, CKMB, CKMBINDEX, TROPONINI in the last 168 hours. BNP: BNP (last 3 results) No results for input(s): BNP in the last 8760 hours.  ProBNP (last 3 results) No results for input(s): PROBNP in the last 8760 hours.  CBG: No results for input(s): GLUCAP in the last 168 hours.     Signed:  Ramiro Harvest MD.  [facility] 04/14/2018, 4:06 PM

## 2018-04-14 NOTE — Progress Notes (Signed)
Pharmacy Antibiotic Note  Vanessa Doyle is a 17 y.o. female admitted on 04/13/2018 with cellulitis.  Pharmacy has been consulted for vancomycin dosing.  Plan: Vancomycin 2 gm x1 then 1250 mg IV q12h for est AUC = 486 Goal AUC = 400-500 F/u scr/cultures/levels  Height: 5' 3.25" (160.7 cm) Weight: 198 lb 6 oz (90 kg) IBW/kg (Calculated) : 52.98  Temp (24hrs), Avg:99.4 F (37.4 C), Min:99.4 F (37.4 C), Max:99.4 F (37.4 C)  Recent Labs  Lab 04/11/18 1239 04/11/18 1245 04/13/18 2001 04/13/18 2008  WBC 10.4  --  7.1  --   CREATININE 0.65  --  0.55  --   LATICACIDVEN  --  0.90  --  1.27    Estimated Creatinine Clearance: 121.1 mL/min (by C-G formula based on SCr of 0.55 mg/dL).    No Known Allergies  Antimicrobials this admission: 9/4 vancomycin >>  9/3 ancef   >>   Dose adjustments this admission:   Microbiology results:  BCx:   UCx:    Sputum:    MRSA PCR:   Thank you for allowing pharmacy to be a part of this patient's care.  Susanne Greenhouse R 04/14/2018 12:00 AM

## 2018-04-14 NOTE — Progress Notes (Signed)
Received call from attending-Noted-potential [id number] UR nurse-Shannon-she sys patient failed otpt po abx-Inpt status-removed potential [id number] updated.

## 2018-04-19 DIAGNOSIS — M9903 Segmental and somatic dysfunction of lumbar region: Secondary | ICD-10-CM | POA: Diagnosis not present

## 2018-04-19 DIAGNOSIS — M9902 Segmental and somatic dysfunction of thoracic region: Secondary | ICD-10-CM | POA: Diagnosis not present

## 2018-04-19 DIAGNOSIS — R200 Anesthesia of skin: Secondary | ICD-10-CM | POA: Diagnosis not present

## 2018-04-19 DIAGNOSIS — M9901 Segmental and somatic dysfunction of cervical region: Secondary | ICD-10-CM | POA: Diagnosis not present

## 2018-04-19 LAB — CULTURE, BLOOD (ROUTINE X 2)
CULTURE: NO GROWTH
CULTURE: NO GROWTH
SPECIAL REQUESTS: ADEQUATE
Special Requests: ADEQUATE

## 2018-04-23 DIAGNOSIS — L0390 Cellulitis, unspecified: Secondary | ICD-10-CM | POA: Diagnosis not present

## 2018-05-14 ENCOUNTER — Ambulatory Visit: Payer: 59 | Admitting: Psychiatry

## 2018-05-14 ENCOUNTER — Telehealth: Payer: Self-pay | Admitting: Psychiatry

## 2018-05-14 DIAGNOSIS — M9903 Segmental and somatic dysfunction of lumbar region: Secondary | ICD-10-CM | POA: Diagnosis not present

## 2018-05-14 DIAGNOSIS — M9901 Segmental and somatic dysfunction of cervical region: Secondary | ICD-10-CM | POA: Diagnosis not present

## 2018-05-14 DIAGNOSIS — F331 Major depressive disorder, recurrent, moderate: Secondary | ICD-10-CM | POA: Diagnosis not present

## 2018-05-14 DIAGNOSIS — M9902 Segmental and somatic dysfunction of thoracic region: Secondary | ICD-10-CM | POA: Diagnosis not present

## 2018-05-14 NOTE — Patient Instructions (Signed)
Improve self-care: Setting healthier limits Physical exercise No self-negating Get more sleep Don't isolate self Eat healthier

## 2018-05-14 NOTE — Telephone Encounter (Signed)
Mother Albin Felling phones that patient wants a new medication for sleep induction as though I should just call it into the pharmacy.  When I phone back to clarify the difficulty getting [facility] to accept any medication, mother notes that she would prefer and recommend the patient take a medication for anxiety around-the-clock rather than just a sleeping pill.  She notes that the [facility] staff have recommended supplements for the patient to take for anxiety, but she has not started them yet.  I clarify that the Belsomra 10 mg can be doubled to the 20 mg dose as she had done from 5 to 10 mg in the past until she comes in for her appointment in 17 days.  Parents will work with the patient in the interim about accepting a medication for anxiety that would also be facilitating of capacity for sleep in the interim, especially if the supplements do not help in the interim.

## 2018-05-14 NOTE — Telephone Encounter (Signed)
Mother called and is concerned that Vanessa Doyle is not sleeping.  Current medication is not working.Will you please prescribe a new medication to help with sleep.  [facility].    If you have questions please call [phone]

## 2018-05-14 NOTE — Progress Notes (Signed)
      [facility] Counselor/Therapist Progress Note   Patient ID: Vanessa Doyle, MRN: [id number]  Date: 05/14/2018  Timespent: 60 minutes  Treatment Type: Individual  Subjective: Patient in today reporting increased stress, anxiety, tearfulness, frustration, and feeling tired due to not sleeping well (difficulty falling asleep and staying asleep).  Patient tearfully explains how school is stressful and difficult for her to meet all the demands of her classes.  Does not feel that she is taking too many classes (4).  Feels it's hard to balance school and getting enough sleep. Talked about what would help her feel better today and take better care of herself (eating healthy, some physical exercise, have realistic expectations of herself, set healthy limits with her time, have intentional time and interaction with others).  Having a rougher day today due to stress level being elevated.  Discussed stress reduction and how the improved self-care can help.  Denies and SI/HI.  Has  Plans to be with her parents tonight and feels that will be a good.  **Patient is to schedule follow up appt with Dr. Marlyne Beards (re: medication and sleep issues) at checkout today.  Interventions:Solution Focused, Strength-based, Supportive and Reframing  Mental Status Exam:   Appearance:   Casual     Behavior:  Appropriate and Sharing  Motor:  Normal  Speech/Language:   Normal rate  Affect:  Depressed, Flat and Tearful  Mood:  anxious, depressed and sad  Thought process:  Coherent  Thought content:    Logical  Perceptual disturbances:    Normal  Orientation:  Full (Time, Place, and Person)  Attention:  Good  Concentration:  good  Memory:  Immediate  Fund of knowledge:   Good  Insight:    Good  Judgment:   Fair  Impulse Control:  good    Reported Symptoms: Anxiety, stress, tired and not sleeping well, frustrated  Risk Assessment: Danger to Self:  No Self-injurious Behavior: No Danger to Others: No Duty to  Warn:no Physical Aggression / Violence:No  Access to Firearms a concern: No  Gang Involvement:No   Diagnosis:   ICD-10-CM   1. Major depressive disorder, recurrent episode, moderate (HCC) F33.1      Plan: Plan to see patient next week to continue goal-directed treatment.  [name], LCSW

## 2018-05-21 ENCOUNTER — Ambulatory Visit: Payer: 59 | Admitting: Psychiatry

## 2018-05-21 DIAGNOSIS — F331 Major depressive disorder, recurrent, moderate: Secondary | ICD-10-CM | POA: Diagnosis not present

## 2018-05-21 NOTE — Progress Notes (Signed)
      [facility] Counselor/Therapist Progress Note   Patient ID: Vanessa Doyle, MRN: [id number]  Date: 05/21/2018  Timespent: 45 minutes  Treatment Type: Individual  Subjective: Patient in today feeling tired, stressed, along with some depression and anxiety.  Continues to feel very stressed by her academic program at [employer].  Not sleeping very well and is to check in with Dr. Marlyne Beards in about a week regarding her medication.  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  In trying to discuss some options for her to better address her need for rest/balance in her life, patient did not feel much could be done to reduce her stress and improve self-care.  We still talked about it further and patient was encouraged to give it more consideration as it was emphasized  how important her overall health is in all areas of her life, academics included.  Patient stated she is to return next week and has scheduled some appts in advance.          Interventions:Solution Focused, Strength-based, Supportive and Reframing  Mental Status Exam:   Appearance:   Casual     Behavior:  Appropriate, Sharing and tired  Motor:  Normal  Speech/Language:   Normal rate  Affect:  Depressed, Tearful and anxious  Mood:  anxious, depressed and irritable  Thought process:  Coherent  Thought content:    Logical  Perceptual disturbances:    Normal  Orientation:  Full (Time, Place, and Person)  Attention:  Good  Concentration:  good  Memory:  Immediate  Fund of knowledge:   Good  Insight:    Fair  Judgment:   Good  Impulse Control:  good    Reported Symptoms: depressed mood, anxiety, stressed, tearful, a little jumpy at times, irriti  Risk Assessment: Danger to Self:  No Self-injurious Behavior: No Danger to Others: No Duty to Warn:no Physical Aggression /  Violence:No  Access to Firearms a concern: No  Gang Involvement:No   Diagnosis:   ICD-10-CM   1. Major depressive disorder, recurrent episode, moderate (HCC) F33.1      Plan: Will see patient next week to continue goal-directed treatment.    [name], LCSW

## 2018-05-26 ENCOUNTER — Encounter: Payer: Self-pay | Admitting: Emergency Medicine

## 2018-05-28 ENCOUNTER — Ambulatory Visit: Payer: 59 | Admitting: Psychiatry

## 2018-05-31 ENCOUNTER — Ambulatory Visit: Payer: 59 | Admitting: Psychiatry

## 2018-05-31 ENCOUNTER — Encounter: Payer: Self-pay | Admitting: Psychiatry

## 2018-05-31 VITALS — BP 116/78 | HR 76 | Ht 64.25 in | Wt 200.0 lb

## 2018-05-31 DIAGNOSIS — F411 Generalized anxiety disorder: Secondary | ICD-10-CM

## 2018-05-31 DIAGNOSIS — G4700 Insomnia, unspecified: Secondary | ICD-10-CM

## 2018-05-31 DIAGNOSIS — F5105 Insomnia due to other mental disorder: Secondary | ICD-10-CM

## 2018-05-31 MED ORDER — SUVOREXANT 5 MG PO TABS: 5.0000 mg | ORAL_TABLET | Freq: Every evening | ORAL | 5 refills | Status: DC

## 2018-05-31 MED ORDER — BUSPIRONE HCL 5 MG PO TABS: 5.0000 mg | ORAL_TABLET | Freq: Two times a day (BID) | ORAL | 5 refills | Status: DC

## 2018-05-31 NOTE — Progress Notes (Signed)
[facility] Med Check  Patient ID: OUITA NISH,  MRN: [id number]  PCP: Maryellen Pile, MD  Date of Evaluation: 05/31/2018 Time spent:20 minutes   HISTORY/CURRENT STATUS: HPI  Individual Medical History/ Review of Systems: Changes? :Yes.  Vanessa Doyle is seen conjointly with both parents face-to-face with consent with collateral of mother's phone call early October for psychiatric interview and exam in 6-month evaluation and management of insomnia and anxiety.  In continuing therapy in the interim with [name], LCSW, dissociative symptoms have resolved.  The patient is having no panic or depression.  Neurology has not documented any memory disorder.  Patient has returned to college at [employer] getting up early in the morning to drive 40 minutes there.  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.  Parents hope she will take something for anxiety but at the same time are anxious about medications similar to the patient.  Patient has always refused most medications taking only Belsomra here despite past appointments for the dissociation including treatment recommended with gabapentin she never filled.  She concludes she will not change Belsomra which she did double a couple of times finding she was too drowsy the next morning when lower doses were not helping sleep.  However, she concludes that if she can take something for anxiety in the day like mother suggests, maybe she can sleep better at night with a lower dose of the Belsomra which she has appreciated as a good medication for her.  Allergies: Patient has no known allergies.  Current Medications:  Current Outpatient Medications:  .  acetaminophen (TYLENOL) 325 MG tablet, Take 2 tablets (650 mg total) by mouth every 6 (six) hours as needed for fever or mild pain., Disp: , Rfl:  .  BELSOMRA 10 MG TABS, Take 10 mg by mouth at bedtime., Disp: , Rfl: 2 .  busPIRone (BUSPAR) 5 MG tablet, Take 1 tablet (5 mg total) by  mouth 2 (two) times daily., Disp: 60 tablet, Rfl: 5 .  mupirocin ointment (BACTROBAN) 2 %, Apply 1 application topically 3 (three) times daily as needed (for one week). , Disp: , Rfl: 0 .  promethazine (PHENERGAN) 25 MG tablet, TAKE 1/2 TO 1 TABLET AT ONSET OF MIGRAINE WITH NAUSEA. MAY REPEAT IN 6 HOURS IF NEEDED (Patient not taking: Reported on 04/13/2018), Disp: 20 tablet, Rfl: 3 .  sulfamethoxazole-trimethoprim (BACTRIM DS,SEPTRA DS) 800-160 MG tablet, Take 2 tablets by mouth 2 (two) times daily. , Disp: , Rfl:  .  Suvorexant (BELSOMRA) 5 MG TABS, Take 5 mg by mouth Nightly., Disp: 30 tablet, Rfl: 5 .  tiZANidine (ZANAFLEX) 4 MG tablet, Take 1 tablet at bedtime for pain. (Patient not taking: Reported on 04/13/2018), Disp: 30 tablet, Rfl: 3 .  traMADol (ULTRAM) 50 MG tablet, Take 1 tablet (50 mg total) by mouth every 6 (six) hours as needed., Disp: 15 tablet, Rfl: 0  Medication Side Effects: Fatigue and Sedation  Family Medical/ Social History: Changes? Yes paternal grandmother and 2 paternal uncles have depression as does another extended relative.  MENTAL HEALTH EXAM: Obesity is up 9 pounds in 4 months and migraine is stable otherwise 5 systems negative. Blood pressure 116/78, pulse 76, height 5' 4.25" (1.632 m), weight 200 lb (90.7 kg).Body mass index is 34.06 kg/m.  General Appearance: Fairly Groomed and Guarded  Eye Contact:  Fair  Speech:  Clear and Coherent  Volume:  Normal  Mood:  Anxious and Euthymic  Affect:  Full Range  Thought Process:  Coherent and Linear  Orientation:  Full (Time, Place, and Person)  Thought Content: Rumination   Suicidal Thoughts:  No  Homicidal Thoughts:  No  Memory:  Immediate  Judgement:  Fair  Insight:  Fair  Psychomotor Activity:  Normal and Mannerisms  Concentration:  Concentration: Fair and Attention Span: Good  Recall:  Good  Fund of Knowledge: Good  Language: Good  Akathisia:  No  AIMS (if indicated): done =0, postural reflexes 0/0 and muscle  strength 5/5  Assets:  Leisure Time Talents/Skills  ADL's:  Intact  Cognition: WNL  Prognosis:  Fair    DIAGNOSES:    ICD-10-CM   1. Insomnia disorder, with non-sleep disorder mental comorbidity, persistent G47.00 Suvorexant (BELSOMRA) 5 MG TABS  2. Generalized anxiety disorder F41.1 busPIRone (BUSPAR) 5 MG tablet    RECOMMENDATIONS: In processing all options with both parents and patient clarifying the optimal amount of information for patient by which to decide, they can collaborate to start BuSpar 5 mg morning and midafternoon #60 with 5 refills to [facility] along with Belsomra reduced to 5 mg nightly again as needed okay to double up when necessary #30 with 5 refills.  May wish to change supply to [facility] if working well.  Tranxene or Restoril may be an option in place of Belsomra if needed and BuSpar may need titration mother though she is opposed to SSRI.  Can use therapy with [name], LCSW or they are educated on warnings and risk of diagnoses and treatment including medication for prevention and monitoring, safety hygiene, and crisis plans if needed as exposure desensitization thought stopping response prevention cognitive behavioral therapy addresses sleep hygiene, behavioral nutrition, and social and learning skill interventions.    Chauncey Mann, MD

## 2018-06-01 DIAGNOSIS — S90811A Abrasion, right foot, initial encounter: Secondary | ICD-10-CM | POA: Diagnosis not present

## 2018-06-04 ENCOUNTER — Ambulatory Visit: Payer: 59 | Admitting: Psychiatry

## 2018-06-04 DIAGNOSIS — F411 Generalized anxiety disorder: Secondary | ICD-10-CM | POA: Diagnosis not present

## 2018-06-04 NOTE — Progress Notes (Signed)
      [facility] Counselor/Therapist Progress Note   Patient ID: Vanessa Doyle, MRN: [id number]  Date: 06/04/2018  Timespent: 45 minutes  Treatment Type: Individual  Subjective:   Patient in today feeling very stressed, anxious, and fatigued.  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  Feeling very behind and not understanding all that's being taught in her classes.  Has begun to check in personally with an instructor to get additional help and feel encouraged by that.  Saw Dr. Marlyne Beards this week and now taking a med during the day that will hopefully help her thoughts to calm some and be able to get better sleep at night.   Interventions:Solution Focused, Strength-based and Supportive  Mental Status Exam:   Appearance:   Casual     Behavior:  Appropriate and Sharing  Motor:  Normal  Speech/Language:   Normal Rate  Affect:  Congruent  Mood:  anxious  Thought process:  Relevant  Thought content:    Logical  Perceptual disturbances:    Normal  Orientation:  Full (Time, Place, and Person)  Attention:  Good  Concentration:  down slightly  Memory:  Immediate  Fund of knowledge:   Good  Insight:    Fair  Judgment:   Good  Impulse Control:  good    Reported Symptoms: fatigue, anxious, stressed, burnt out with community college, frustrated, some depressed mood (but less than last couple wks.)  Risk Assessment: Danger to Self:  No Self-injurious Behavior: No Danger to Others: No Duty to Warn:no Physical Aggression / Violence:No  Access to Firearms a concern: No Gang Involvement:No   Diagnosis:   ICD-10-CM   1. Generalized anxiety disorder F41.1      Plan: Will see patient again in 1-2 weeks to continue goal-directed treatment.  [name], LCSW

## 2018-06-11 ENCOUNTER — Encounter: Payer: Self-pay | Admitting: Psychiatry

## 2018-06-11 ENCOUNTER — Ambulatory Visit: Payer: 59 | Admitting: Psychiatry

## 2018-06-11 DIAGNOSIS — F411 Generalized anxiety disorder: Secondary | ICD-10-CM | POA: Diagnosis not present

## 2018-06-11 DIAGNOSIS — M9903 Segmental and somatic dysfunction of lumbar region: Secondary | ICD-10-CM | POA: Diagnosis not present

## 2018-06-11 DIAGNOSIS — M9901 Segmental and somatic dysfunction of cervical region: Secondary | ICD-10-CM | POA: Diagnosis not present

## 2018-06-11 DIAGNOSIS — M9902 Segmental and somatic dysfunction of thoracic region: Secondary | ICD-10-CM | POA: Diagnosis not present

## 2018-06-11 NOTE — Progress Notes (Signed)
      [facility] Counselor/Therapist Progress Note   Patient ID: Vanessa Doyle, MRN: [id number]  Date: 06/11/2018  Timespent: 48 minutes   Treatment Type: Individual   Reported Symptoms: some less stressed, anxiety,unmotivated   Mental Status Exam:    Appearance:   Casual     Behavior:  Appropriate and Sharing  Motor:  Normal  Speech/Language:   Normal Rate  Affect:  Congruent  Mood:  anxious  Thought process:  normal  Thought content:    WNL  Sensory/Perceptual disturbances:    WNL  Orientation:  oriented to person, place, time/date, situation, day of week, month of year and year  Attention:  Good  Concentration:  Good  Memory:  WNL  Fund of knowledge:   Good  Insight:    Good  Judgment:   Good  Impulse Control:  Good     Risk Assessment: Danger to Self:  No Self-injurious Behavior: No Danger to Others: No Duty to Warn:no Physical Aggression / Violence:No  Access to Firearms a concern: No  Gang Involvement:No    Subjective:  Patient in today, not quite as stressed as usual, but still experiences anxiety and difficulty staying motivated.  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  Talked about her difficulty in balancing tasks, time management, motivation, and the need for positive self-talk .  Is concerned that her part time job is ending and needs to find another job.  Patient had been more calm until getting very anxious and tearful at end of session.  Anxiety heightened which we talked through.  Tends to reject subject of self-care when it is mentioned.  Assured her of my support and included the importance of self-care and setting limits.    Interventions: Solution-Oriented/Positive Psychology, Ego-Supportive and Insight-Oriented   Diagnosis:   ICD-10-CM   1. Generalized anxiety disorder F41.1      Plan:   Plan to see patient again in 1-2 weeks to continue goal-directed treatment.   [name], LCSW

## 2018-06-18 ENCOUNTER — Ambulatory Visit: Payer: 59 | Admitting: Psychiatry

## 2018-06-18 DIAGNOSIS — F411 Generalized anxiety disorder: Secondary | ICD-10-CM | POA: Diagnosis not present

## 2018-06-18 DIAGNOSIS — Z0000 Encounter for general adult medical examination without abnormal findings: Secondary | ICD-10-CM | POA: Diagnosis not present

## 2018-06-18 DIAGNOSIS — Z68.41 Body mass index (BMI) pediatric, greater than or equal to 95th percentile for age: Secondary | ICD-10-CM | POA: Diagnosis not present

## 2018-06-18 DIAGNOSIS — Z713 Dietary counseling and surveillance: Secondary | ICD-10-CM | POA: Diagnosis not present

## 2018-06-18 NOTE — Progress Notes (Signed)
      [facility] Counselor/Therapist Progress Note   Patient ID: Vanessa Doyle, MRN: [id number]  Date: 06/18/2018  Timespent: 48 minutes   Treatment Type: Individual   Reported Symptoms: Fatigue and anxiety, (fatigue is not quite as bad)   Mental Status Exam:    Appearance:   Casual     Behavior:  Appropriate and Sharing  Motor:  Normal  Speech/Language:   Normal Rate  Affect:  Blunt  Mood:  anxious  Thought process:  normal  Thought content:    WNL  Sensory/Perceptual disturbances:    WNL  Orientation:  oriented to person, place, time/date, situation, day of week, month of year and year  Attention:  Good  Concentration:  Fair  Memory:  WNL  Fund of knowledge:   Good  Insight:    Fair  Judgment:   Good  Impulse Control:  Good     Risk Assessment: Danger to Self:  No Self-injurious Behavior: No Danger to Others: No Duty to Warn:no Physical Aggression / Violence:No  Access to Firearms a concern: No  Gang Involvement:No    Subjective: Patient in today  With symptoms of fatigue and anxiety, although the fatigue has decreased. Wants a part time job but her available hours are few due to her school schedule, so it has been difficult to find one thus far. Frustrated with school and not having as much free time.  Quite stressed with school.  Discussed self-care, while also acknowledging the tightness of her schedule and the demands on her time.  Based on what she shares, she tends to reject and get angry when others try to be supportive as she feels none of them understand her or her situation.       Interventions: Solution-Oriented/Positive Psychology, Ego-Supportive and Insight-Oriented   Diagnosis:   ICD-10-CM   1. Generalized anxiety disorder F41.1      Plan:  Plan to see patient within 2 weeks to continue goal-directed treatment.   [name], LCSW

## 2018-06-25 ENCOUNTER — Ambulatory Visit: Payer: 59 | Admitting: Psychiatry

## 2018-06-25 DIAGNOSIS — F411 Generalized anxiety disorder: Secondary | ICD-10-CM | POA: Diagnosis not present

## 2018-06-25 NOTE — Progress Notes (Signed)
      [facility] Counselor/Therapist Progress Note   Patient ID: Vanessa CarsonGrace C Burbano, MRN: [id number]  Date: 06/25/2018  Timespent: 50 minutes   Treatment Type: Individual   Reported Symptoms: "general anxiety", stress, overwhelmed   Mental Status Exam:    Appearance:   Casual     Behavior:  Appropriate and Sharing  Motor:  Normal  Speech/Language:   Normal Rate  Affect:  Congruent  Mood:  anxious and depressed  Thought process:  normal  Thought content:    WNL  Sensory/Perceptual disturbances:    WNL  Orientation:  oriented to person, place, time/date, situation, day of week, month of year and year  Attention:  Good  Concentration:  Good  Memory:  WNL  Fund of knowledge:   Good  Insight:    Fair  Judgment:   Good  Impulse Control:  good     Risk Assessment: Danger to Self:  No Self-injurious Behavior: No Danger to Others: No Duty to Warn:no Physical Aggression / Violence:No  Access to Firearms a concern: No  Gang Involvement:No    Subjective: Patient in today with continued anxiety (school, personal, "general").  Reviewed strategies for better managing stress, and looked particularly at her need to better discern what is really in her control and what is not. Also worked on her need to better embrace self-care which could help her a lot in stress management.  Some tearfulness and states that she tends to hold onto feelings but feels this is a safe place to let them out. Sleep is some better after last change in meds.  Goal review and progress noted.     Interventions: Cognitive Behavioral Therapy, Solution-Oriented/Positive Psychology and Ego-Supportive   Diagnosis:   ICD-10-CM   1. Generalized anxiety disorder F41.1      Plan: Patient will commit more to self-care (physically, emotionally, mentally, interpersonally).  This seems to be challenging for patient, but is beginning to be a bit more attuned to self-care and how this impacts her over-all at school,  with family, work, and socially.  Trying to work out a better schedule for her school work and to have some down time just for herself.  This  is challenging for patient.     Mathis Fareeborah Gryffin Altice, LCSW

## 2018-06-29 ENCOUNTER — Encounter: Payer: 59 | Admitting: Psychology

## 2018-07-02 ENCOUNTER — Ambulatory Visit: Payer: 59 | Admitting: Psychiatry

## 2018-07-02 DIAGNOSIS — F411 Generalized anxiety disorder: Secondary | ICD-10-CM | POA: Diagnosis not present

## 2018-07-02 NOTE — Progress Notes (Signed)
      [facility] Counselor/Therapist Progress Note  Patient ID: Vanessa Doyle, MRN: [id number],    Date: 07/02/2018  Time Spent: 48 minutes   Treatment Type: Individual Therapy  Reported Symptoms: anxiety, stressed, frustration, irritability  Mental Status Exam:  Appearance:   Casual     Behavior:  Appropriate and Sharing  Motor:  Normal  Speech/Language:   Normal Rate  Affect:  Congruent  Mood:  anxious, depressed and irritable  Thought process:  normal  Thought content:    WNL  Sensory/Perceptual disturbances:    WNL  Orientation:  oriented to person, place, time/date, situation, day of week, month of year and year  Attention:  Good  Concentration:  Good  Memory:  WNL  Fund of knowledge:   Good  Insight:    Fair  Judgment:   Fair  Impulse Control:  Fair   Risk Assessment: Danger to Self:  No Self-injurious Behavior: No Danger to Others: No Duty to Warn:no Physical Aggression / Violence:No  Access to Firearms a concern: No  Gang Involvement:No   Subjective:  Patient in today with symptoms of anxiety, stress, irritability, and depression. 1st semester is winding down with lots of assignments due which patient is finding very stressful. Fatigued--explains that she's had a really busy week and a big dinner last night at the [place] residence where she lives.  Emphasized her need to set realistic expectations for herself and focus on self-care in the midst of all she has going on..  Interventions: Solution-Oriented/Positive Psychology and Ego-Supportive  Diagnosis:   ICD-10-CM   1. Generalized anxiety disorder F41.1     Plan:  Patient to increase her self-care (mentally, physically,emotionally) to help herself through this stressful time of completing semester assignments/projects.  Encouraged part of her self-care to include some occasional breaks for herself, even though patient feels that's not possible.  Time management discussed, yet patient find's it difficult  to have any flexibility.  Encouraged her to leave options open and not box self in, as there's been some times when schedule changed and she was able to have some time for herself.    Mathis Fareeborah Davone Shinault, LCSW

## 2018-07-05 ENCOUNTER — Ambulatory Visit: Payer: 59 | Admitting: Psychiatry

## 2018-07-16 ENCOUNTER — Ambulatory Visit: Payer: 59 | Admitting: Psychiatry

## 2018-07-16 DIAGNOSIS — F411 Generalized anxiety disorder: Secondary | ICD-10-CM | POA: Diagnosis not present

## 2018-07-16 NOTE — Progress Notes (Signed)
      [facility] Counselor/Therapist Progress Note  Patient ID: Vanessa CarsonGrace C Lindy, MRN: [id number],    Date: 07/16/2018  Time Spent:   52 minutes  Treatment Type: Individual Therapy  Reported Symptoms: Depressed mood, Anxious Mood and Fatigue  Mental Status Exam:  Appearance:   Casual     Behavior:  Appropriate and Sharing  Motor:  Normal  Speech/Language:   Normal Rate  Affect:  Congruent  Mood:  anxious and depressed  Thought process:  normal  Thought content:    WNL  Sensory/Perceptual disturbances:    WNL  Orientation:  oriented to person, place, time/date, situation, day of week, month of year and year  Attention:  Good  Concentration:  Good  Memory:  WNL Immediate;   Fair  [employer] EnergyFund of knowledge:   Good  Insight:    Fair  Judgment:   Good  Impulse Control:  Good   Risk Assessment: Danger to Self:  No Self-injurious Behavior: No Danger to Others: No Duty to Warn:no Physical Aggression / Violence:No  Access to Firearms a concern: No  Gang Involvement:No   Subjective:  Patient in today with some anxiety, depression, and fatigue.  Feels her night time medication is not working as well as previously.  Sleep is erratic some nights.  Had seemed earlier to be working and she wonders if it's more related to extra stress here at semester ending time. Wants to wait and see how it works once the end of semester exams and assignments are over, sometime in December after Christmas and see Dr. Marlyne BeardsJennings for a medication check.  School is frustrating and sometimes she's not sure how to change some of her turned-in work to make it earn a passing grade.      Interventions: Solution-Oriented/Positive Psychology and Ego-Supportive  Diagnosis:   ICD-10-CM   1. Generalized anxiety disorder F41.1     Plan:  Patient encouraged to practice self-care (physically, mentally, and emotionally) as we've discussed earlier, and plan to check in with Dr Marlyne BeardsJennings near end of December re: her meds.    Mathis Fareeborah Yanis Larin, LCSW

## 2018-07-20 ENCOUNTER — Encounter: Payer: 59 | Admitting: Psychology

## 2018-07-23 ENCOUNTER — Ambulatory Visit: Payer: 59 | Admitting: Psychiatry

## 2018-07-23 DIAGNOSIS — F411 Generalized anxiety disorder: Secondary | ICD-10-CM | POA: Diagnosis not present

## 2018-07-23 NOTE — Progress Notes (Signed)
      [facility] Counselor/Therapist Progress Note  Patient ID: Vanessa CarsonGrace C Laubacher, MRN: [id number],    Date: 07/23/2018  Time Spent:  52 minutes   Treatment Type: Individual Therapy   Reported Symptoms: anxious, fatigue, stress,   Mental Status Exam:  Appearance:   Casual     Behavior:  Appropriate and Sharing  Motor:  Normal  Speech/Language:   Normal Rate  Affect:  Exhausted  Mood:  anxious, decreased range and irritable  Thought process:  normal  Thought content:    WNL  Sensory/Perceptual disturbances:    WNL  Orientation:  oriented to person, place, time/date, situation, day of week, month of year and year  Attention:  Good  Concentration:  Good  Memory:  Immediate;   fair, but patient reports "it's better than last year" Recent;   Fair, but better than last year per patient  Fund of knowledge:   Good  Insight:    Fair  Judgment:   Good  Impulse Control:  Good   Risk Assessment: Danger to Self:  No Self-injurious Behavior: No Danger to Others: No Duty to Warn:no Physical Aggression / Violence:No  Access to Firearms a concern: No  Gang Involvement:No   Subjective: Patient in today and stressed with personal issues and the fact that it is Final Exam week for her next week.  Having a recurrent episode of cellutlitis on her lower left leg.  Shared that she has an appt to check in with her PCP early this coming week. Worked on stress management strategies to help her juggle multiple stressors right now.  Interventions: Cognitive Behavioral Therapy and Ego-Supportive  Diagnosis:  F41.1   Generalized Anxiety Disorder  Plan:  Patient to use strategies discussed for managing anxiety and stress.  Also to follow up with PCP re: cellutlis mentioned above.  Good self-care urged including: mentally (positive self-statements), physical exercise, healthy eating, and maintaining some social contacts.  Mathis Fareeborah Verner Kopischke, LCSW

## 2018-07-24 ENCOUNTER — Ambulatory Visit (HOSPITAL_COMMUNITY)
Admission: EM | Admit: 2018-07-24 | Discharge: 2018-07-24 | Disposition: A | Discharge status: Home / Self Care | Payer: 59

## 2018-07-24 ENCOUNTER — Encounter (HOSPITAL_COMMUNITY): Payer: Self-pay | Admitting: Emergency Medicine

## 2018-07-24 DIAGNOSIS — S81802A Unspecified open wound, left lower leg, initial encounter: Secondary | ICD-10-CM

## 2018-07-24 DIAGNOSIS — Z5189 Encounter for other specified aftercare: Secondary | ICD-10-CM

## 2018-07-24 NOTE — ED Provider Notes (Addendum)
[facility]    CSN: [id number] Arrival date & time: 07/24/18  1034     History   Chief Complaint Chief Complaint  Patient presents with  . Wound Check    HPI Vanessa Doyle is a 17 y.o. female.   17 year old female comes in with mother for 3-day history of wound to the left lower extremity.  Unsure how wound was sustained.  But noticed increased swelling and erythema last night, and came in for evaluation.  States she had similar symptoms in the past to the right lower extremity, and was treated for cellulitis at the time.  She denies any fever, chills, night sweats.  Used marker to mark where the swelling was last night, which has reduced significantly.  Has not taken anything for the symptoms.     Past Medical History:  Diagnosis Date  . Episodic tension type headache 11/24/2014  . Migraine without aura and without status migrainosus, not intractable 11/24/2014  . Migraine without aura and without status migrainosus, not intractable     Patient Active Problem List   Diagnosis Date Noted  . Insomnia disorder, with non-sleep disorder mental comorbidity, persistent 05/31/2018  . Cellulitis 04/13/2018  . Difficulty with speech 01/01/2018  . Word finding difficulty 01/01/2018  . Subjective memory complaints 01/01/2018  . Hypotension 01/31/2015  . Generalized anxiety disorder 11/24/2014  . Migraine without aura and without status migrainosus, not intractable 11/24/2014  . Episodic tension type headache 11/24/2014    History reviewed. No pertinent surgical history.  OB History   No obstetric history on file.      Home Medications    Prior to Admission medications   Medication Sig Start Date End Date Taking? Authorizing Provider  acetaminophen (TYLENOL) 325 MG tablet Take 2 tablets (650 mg total) by mouth every 6 (six) hours as needed for fever or mild pain. 04/14/18   Rodolph Bonghompson, Daniel V, MD  BELSOMRA 10 MG TABS Take 10 mg by mouth at bedtime. 02/17/18    [provider]  busPIRone (BUSPAR) 5 MG tablet Take 1 tablet (5 mg total) by mouth 2 (two) times daily. 05/31/18   Chauncey MannJennings, Glenn E, MD  mupirocin ointment (BACTROBAN) 2 % Apply 1 application topically 3 (three) times daily as needed (for one week).  04/09/18   [provider]  promethazine (PHENERGAN) 25 MG tablet TAKE 1/2 TO 1 TABLET AT ONSET OF MIGRAINE WITH NAUSEA. MAY REPEAT IN 6 HOURS IF NEEDED Patient not taking: Reported on 04/13/2018 08/02/15   Elveria RisingGoodpasture, Tina, NP  Suvorexant (BELSOMRA) 5 MG TABS Take 5 mg by mouth Nightly. 05/31/18   Chauncey MannJennings, Glenn E, MD  tiZANidine (ZANAFLEX) 4 MG tablet Take 1 tablet at bedtime for pain. Patient not taking: Reported on 04/13/2018 12/06/15   Elveria RisingGoodpasture, Tina, NP  traMADol (ULTRAM) 50 MG tablet Take 1 tablet (50 mg total) by mouth every 6 (six) hours as needed. Patient not taking: Reported on 07/24/2018 04/14/18   Rodolph Bonghompson, Daniel V, MD    Family History Family History  Problem Relation Age of Onset  . Heart disease Paternal Grandmother   . Migraines Father     Social History Social History   Tobacco Use  . Smoking status: Never Smoker  . Smokeless tobacco: Never Used  Substance Use Topics  . Alcohol use: No  . Drug use: No     Allergies   Patient has no known allergies.   Review of Systems Review of Systems  Reason unable to perform ROS: See  HPI as above.     Physical Exam Triage Vital Signs ED Triage Vitals [07/24/18 1125]  Enc Vitals Group     BP (!) 100/51     Pulse Rate (!) 59     Resp 18     Temp 98 F (36.7 C)     Temp Source Oral     SpO2 97 %     Weight      Height      Head Circumference      Peak Flow      Pain Score 2     Pain Loc      Pain Edu?      Excl. in GC?    No data found.  Updated Vital Signs BP (!) 100/51 (BP Location: Left Arm)   Pulse (!) 59   Temp 98 F (36.7 C) (Oral)   Resp 18   SpO2 97%   Physical Exam Constitutional:      General: She is not in acute  distress.    Appearance: She is well-developed. She is not diaphoretic.  HENT:     Head: Normocephalic and atraumatic.  Eyes:     Conjunctiva/sclera: Conjunctivae normal.     Pupils: Pupils are equal, round, and reactive to light.  Skin:    General: Skin is warm and dry.     Comments: See pictures below. No obvious swelling seen. Wound about 0.5cm large that is scabbed over. No drainage. Mild surrounding erythema, no warmth. No fluctuance or induration.   Neurological:     Mental Status: She is alert and oriented to person, place, and time.          UC Treatments / Results  Labs (all labs ordered are listed, but only abnormal results are displayed) Labs Reviewed - No data to display  EKG None  Radiology No results found.  Procedures Procedures (including critical care time)  Medications Ordered in UC Medications - No data to display  Initial Impression / Assessment and Plan / UC Course  I have reviewed the triage vital signs and the nursing notes.  Pertinent labs & imaging results that were available during my care of the patient were reviewed by me and considered in my medical decision making (see chart for details).    Discussed with patient and parent, no obvious cellulitis at this time.  Swelling from last night has significantly decreased, and no obvious swelling today on exam.  Will have patient continue to monitor.  Use warm compress on location.  Given no open wound, no need for antibiotic ointment.  Return precautions given.  Discussed possible causes of symptoms, patient to keep a razor clean and dry.  Discussed using Hibiclens after shaving to see if it can prevent symptoms.  Patient and mother expresses understanding and agrees to plan.  Final Clinical Impressions(s) / UC Diagnoses   Final diagnoses:  Visit for wound check    ED Prescriptions    None        Belinda Fisher, PA-C 07/24/18 1204    [address] V, PA-C 07/24/18 1204

## 2018-07-24 NOTE — Discharge Instructions (Signed)
No signs of infection at this time. Continue to monitor. You can do warm compresses to help area heal. Monitor for spreading redness, increased warmth, increased pain, fever, follow up for reevaluation needed.   Make sure razors are clean. After shaving, can use Hibiclens (chlorhexidine) to prevent infection.

## 2018-07-24 NOTE — ED Notes (Signed)
Discharged by this nurse.  Discharged with mother

## 2018-07-24 NOTE — ED Triage Notes (Signed)
Pt sts hx of cellulitis to right leg; pt now has similar to left leg

## 2018-07-30 ENCOUNTER — Ambulatory Visit: Payer: 59 | Admitting: Psychiatry

## 2018-08-06 ENCOUNTER — Ambulatory Visit: Payer: 59 | Admitting: Psychiatry

## 2018-08-06 DIAGNOSIS — F411 Generalized anxiety disorder: Secondary | ICD-10-CM | POA: Diagnosis not present

## 2018-08-06 NOTE — Progress Notes (Signed)
      [facility] Counselor/Therapist Progress Note  Patient ID: Herma CarsonGrace C Ortega, MRN: [id number],    Date: 08/06/2018  Time Spent: 50 minutes  Treatment Type: Individual Therapy  Reported Symptoms:  Anxiety, stress,   Mental Status Exam:  Appearance:   Casual     Behavior:  Appropriate and Sharing  Motor:  Normal  Speech/Language:   Normal Rate  Affect:  Congruent  Mood:  anxious, depressed and irritable  Thought process:  normal  Thought content:    WNL  Sensory/Perceptual disturbances:    WNL  Orientation:  oriented to person, place, time/date, situation, day of week, month of year and year  Attention:  Good  Concentration:  Good  Memory:  WNL  Fund of knowledge:   Good  Insight:    Good  Judgment:   Fair  Impulse Control:  Good   Risk Assessment: Danger to Self:  No Self-injurious Behavior: No Danger to Others: No Duty to Warn:no Physical Aggression / Violence:No  Access to Firearms a concern: No  Gang Involvement:No   Subjective:  Patient in today with the symptoms reported above.  Trying to lose some weight (in healthy ways) to improve her health and overall well being.  Stressed with an event she has to attend [facility], doesn't know all details yes but is in early January. That event is creating some anxiety for her right now along family and school, and with [place] [employer], starts Dec. 31st, and they've offered flexible scheduling. Got very stressed at one of their family holiday gathering and processed this in session today.  Is appearing some more upbeat, in spite of her stress, and not quite as fatigued as when she was in midst of school.  Discussed some stress management techniques and also the need to prioritize the demands on her time and set healthier limits.   Interventions: Solution-Oriented/Positive Psychology  Diagnosis:   ICD-10-CM   1. 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  Mathis Fareeborah Merril Nagy, LCSW

## 2018-08-10 ENCOUNTER — Ambulatory Visit: Payer: 59 | Admitting: Psychiatry

## 2018-08-10 ENCOUNTER — Encounter: Payer: Self-pay | Admitting: Psychiatry

## 2018-08-10 VITALS — BP 118/80 | HR 84 | Ht 64.0 in | Wt 194.0 lb

## 2018-08-10 DIAGNOSIS — F5105 Insomnia due to other mental disorder: Secondary | ICD-10-CM

## 2018-08-10 DIAGNOSIS — F411 Generalized anxiety disorder: Secondary | ICD-10-CM | POA: Diagnosis not present

## 2018-08-10 DIAGNOSIS — G4700 Insomnia, unspecified: Secondary | ICD-10-CM | POA: Diagnosis not present

## 2018-08-10 MED ORDER — TEMAZEPAM 7.5 MG PO CAPS: 7.5000 mg | ORAL_CAPSULE | Freq: Every evening | ORAL | 3 refills | Status: DC | PRN

## 2018-08-10 NOTE — Progress Notes (Signed)
[facility] Med Check  Patient ID: Vanessa Doyle,  MRN: [id number]  PCP: Maryellen Pile, MD  Date of Evaluation: 08/10/2018 Time spent:20 minutes  Chief Complaint:  Chief Complaint    Anxiety; Medication Reaction      HISTORY/CURRENT STATUS: Vanessa Doyle is seen individually for the first time face-to-face with consent not collateral for psychiatric interview and exam in 33-month evaluation and management of insomnia, generalized anxiety and currently remission of dissociative symptoms.  She has a job change and more success at [employer] though classes are getting more difficult as she resides with 3 other females in a house. She exhibits self talking and reassurance checking throughout the interaction though subtle enough to be social itself.  She has questions and self checking ways in order to cope with generalized worry including for answer.  She is pleased with BuSpar though she calls that the daytime medicine but she finds the Belsomra is either too sedating at 10 mg or insufficient at 5 mg.  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 She inquires about over-the-counter medications again, and states that melatonin did not work and declines all others including Vistaril by prescription.  Anxiety  Presents for follow-up visit. Symptoms include compulsions, decreased concentration, excessive worry, insomnia, nervous/anxious behavior and suicidal ideas. Patient reports no panic, restlessness or shortness of breath. Symptoms occur most days. The severity of symptoms is causing significant distress and interfering with daily activities. The patient sleeps 4 hours per night. The quality of sleep is poor. Nighttime awakenings: several.   Compliance with medications is 76-100%. Side effects of treatment include GI  discomfort and visual problems.    Individual Medical History/ Review of Systems: Changes? :Yes Patient is having fewer symptoms as she utilizes her own adaptiveness and independence to deal with daily stress.  Allergies: Patient has no known allergies.  Current Medications:  Current Outpatient Medications:  .  acetaminophen (TYLENOL) 325 MG tablet, Take 2 tablets (650 mg total) by mouth every 6 (six) hours as needed for fever or mild pain., Disp: , Rfl:  .  BELSOMRA 10 MG TABS, Take 10 mg by mouth at bedtime., Disp: , Rfl: 2 .  busPIRone (BUSPAR) 5 MG tablet, Take 1 tablet (5 mg total) by mouth 2 (two) times daily., Disp: 60 tablet, Rfl: 5 .  mupirocin ointment (BACTROBAN) 2 %, Apply 1 application topically 3 (three) times daily as needed (for one week). , Disp: , Rfl: 0 .  promethazine (PHENERGAN) 25 MG tablet, TAKE 1/2 TO 1 TABLET AT ONSET OF MIGRAINE WITH NAUSEA. MAY REPEAT IN 6 HOURS IF NEEDED (Patient not taking: Reported on 04/13/2018), Disp: 20 tablet, Rfl: 3 .  Suvorexant (BELSOMRA) 5 MG TABS, Take 5 mg by mouth Nightly., Disp: 30 tablet, Rfl: 5 .  temazepam (RESTORIL) 7.5 MG capsule, Take 1 capsule (7.5 mg total) by mouth at bedtime as needed for sleep., Disp: 30 capsule, Rfl: 3 .  tiZANidine (ZANAFLEX) 4 MG tablet, Take 1 tablet at bedtime for pain. (Patient not taking: Reported on 04/13/2018), Disp: 30 tablet, Rfl: 3 .  traMADol (ULTRAM) 50 MG tablet, Take 1 tablet (50 mg total) by mouth every 6 (six) hours as needed. (Patient not taking: Reported on 07/24/2018), Disp: 15 tablet, Rfl: 0 Medication Side Effects: hypersomnolence  Family Medical/ Social History: Changes? 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  General  Appearance: Casual, Fairly Groomed, Guarded and Meticulous  Eye Contact:  Fair  Speech:  Garbled and Talkative  Volume:  Normal  Mood:  Anxious, Euthymic and Worthless  Affect:  Inappropriate, Labile and Anxious  Thought Process:  Goal Directed and Linear  Orientation:  Full (Time, Place, and Person)  Thought Content: Ilusions, Obsessions and Rumination   Suicidal Thoughts:  No  Homicidal Thoughts:  No  Memory:  Immediate;   Good Remote;   Good  Judgement:  Fair  Insight:  Lacking  Psychomotor Activity:  Increased and Mannerisms  Concentration:  Concentration: Good and Attention Span: Fair  Recall:  [employer]  Fund of Knowledge: Fair  Language: Good  Assets:  Housing Leisure Time Transportation  ADL's:  Intact  Cognition: WNL  Prognosis:  Good    DIAGNOSES:    ICD-10-CM   1. Generalized anxiety disorder F41.1 temazepam (RESTORIL) 7.5 MG capsule  2. Insomnia disorder, with non-sleep disorder mental comorbidity, persistent G47.00 temazepam (RESTORIL) 7.5 MG capsule    Receiving Psychotherapy: Yes Mathis Fareeborah Dowd, LCSW   RECOMMENDATIONS: Differential diagnosis might include ADHD at some time.  However her chief complaint today is her insomnia even though generalized anxiety contributes to that. Buspar is helping diurnal anxiety and 1 of the few medications she accepts without side effects.  Takes BuSpar at 06 113 100.  She is prescribed Buspar 5 mg twice daily 3 refills remaining from the October prescription.  She will discontinue Belsomra and start Restoril 7.5 mg taking 1 every bedtime 30 with 3 refills discussing patient's use for various circumstances sent to [facility] on with education on warnings and risk of diagnoses and treatment including medication for prevention and monitoring, safety hygiene, and crisis plans if needed to call for alternative otherwise.  She declines to commit to a follow-up appointment today but understands the utility of returning in 2 to 3 months.   Chauncey MannGlenn  E Jennings, MD

## 2018-08-13 ENCOUNTER — Ambulatory Visit: Payer: 59 | Admitting: Psychiatry

## 2018-08-17 ENCOUNTER — Ambulatory Visit: Payer: 59 | Admitting: Psychiatry

## 2018-08-24 ENCOUNTER — Ambulatory Visit: Payer: 59 | Admitting: Psychiatry

## 2018-08-24 DIAGNOSIS — F411 Generalized anxiety disorder: Secondary | ICD-10-CM

## 2018-08-24 NOTE — Progress Notes (Signed)
      [facility] Counselor/Therapist Progress Note  Patient ID: Vanessa Doyle, MRN: [id number],    Date: 08/24/2018  Time Spent: 60  minutes  Treatment Type: Individual Therapy  Reported Symptoms:  Anxious, stressed, tired and working to improve her sleep schedule  Mental Status Exam:  Appearance:   Casual     Behavior:  Appropriate and Sharing  Motor:  Normal  Speech/Language:   Normal Rate  Affect:  Congruent  Mood:  anxious and dysthymic  Thought process:  normal  Thought content:    WNL  Sensory/Perceptual disturbances:    WNL  Orientation:  oriented to person, place, time/date, situation, day of week, month of year and year  Attention:  Good  Concentration:  Good  Memory:  WNL  Fund of knowledge:   Good  Insight:    Fair  Judgment:   Good  Impulse Control:  Fair   Risk Assessment: Danger to Self:  No Self-injurious Behavior: No Danger to Others: No Duty to Warn:no Physical Aggression / Violence:No  Access to Firearms a concern: No  Gang Involvement:No   Subjective:  Patient presents anxious, stressed, some dysthymia, and tired, realizing she needs to change up her sleep schedule to get adequate rest.  Have talked about this before, so tried to reinforce it more today.  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  Attended spiritual retreat at [facility] and enjoyed it.  Seems less anxious over-all as she is beginning new semester at [employer].  Able to make some connections with others in class.  Says her mood tends to "come in waves", "sometimes I'm better and doing ok and other times I'm more stressed and not doing as well." Overall, some decrease in anxiety and depressed mood, and feeling at times some more control over her thoughts and mood. Discussed strategies to help manage the anxiety she does experience, especially in relation to school.  Specifically notice patient is much more open and receptive to suggestions,  strategies, and feedback than previously.   Interventions: Cognitive Behavioral Therapy and Ego-Supportive  Diagnosis:   ICD-10-CM   1. Generalized anxiety disorder F41.1     Plan: Patient to practice strategies discussed today to better manage stressful situations and her overall anxiety which is improving.  To return in 1-2 weeks.   [name], LCSW

## 2018-08-30 ENCOUNTER — Ambulatory Visit: Payer: 59 | Admitting: Psychiatry

## 2018-08-30 DIAGNOSIS — F411 Generalized anxiety disorder: Secondary | ICD-10-CM

## 2018-08-30 NOTE — Progress Notes (Signed)
      [facility] Counselor/Therapist Progress Note  Patient ID: SEHAJ HEART, MRN: [id number],    Date: 08/30/2018  Time Spent: 58 minutes  Treatment Type: Individual Therapy  Reported Symptoms:  Anxious, some depressed mood, stress, tired  Mental Status Exam:  Appearance:   Casual     Behavior:  Appropriate and Sharing  Motor:  Normal  Speech/Language:   Normal Rate  Affect:  Appropriate  Mood:  anxious and depressed  Thought process:  normal  Thought content:    WNL  Sensory/Perceptual disturbances:    WNL  Orientation:  Fully oriented to person, date, place, time, situation  Attention:  Good  Concentration:  Good  Memory:  WNL  Fund of knowledge:   Good  Insight:    Fair  Judgment:   Good  Impulse Control:  Good   Risk Assessment: Danger to Self:  No Self-injurious Behavior: No Danger to Others: No Duty to Warn:no Physical Aggression / Violence:No  Access to Firearms a concern: No  Gang Involvement:No   Subjective:  Patient in today with symptoms of anxiety, stress, feeling tired, with some depressed mood (mild).  Having some concerns where she is living in house with 2 other female students.  Looking at other options but not having much success.  Difficulty deciding what might be best.  Doesn't really want to have to move back home unless she doesn't find another option.  Has recently started a newer med, per Dr. Marlyne Beards, to take at night and she feels that has helped her sleep and not feeling as tired when she awakens.  Some friendship stresses discussed.  School for now is "ok" but hasn't yet gotten in full swing of second semester. Talked about ways to approach her classes this year in a way that she feels stronger and more confident.    Interventions: Cognitive Behavioral Therapy and Solution-Oriented/Positive Psychology  Diagnosis:   ICD-10-CM   1. Generalized anxiety disorder F41.1     Plan: Patient to follow through with strategies mentioned above to  help with her stress levels and anxiety.  To return next wk to continue goal-directed treatment.  [name], LCSW

## 2018-09-06 ENCOUNTER — Telehealth: Payer: Self-pay | Admitting: Psychiatry

## 2018-09-06 NOTE — Telephone Encounter (Signed)
I returned call to patient as father requires who explains that she and mother have already researched to see that rapid heartbeat or palpitations are not side effect concerns of buspirone or temazepam.  Patient acknowledges she is under stress at college and will address that in therapy as well.  She inquires about restarting her temazepam and buspirone which I approve as medically appropriate realizing she is working on these options and decisions step-by-step as she is college.

## 2018-09-06 NOTE — Telephone Encounter (Signed)
Patient's dad called and said that on Thursday she was having chest pains and felt her heart racing. She stopped tasking the medcines of buspar and mirtazipine. Please call [place] to discuss what to do. Phine number is [phone]

## 2018-09-07 ENCOUNTER — Ambulatory Visit: Payer: 59 | Admitting: Psychiatry

## 2018-09-07 DIAGNOSIS — F411 Generalized anxiety disorder: Secondary | ICD-10-CM | POA: Diagnosis not present

## 2018-09-07 NOTE — Progress Notes (Signed)
      [facility] Counselor/Therapist Progress Note  Patient ID: Vanessa Doyle, MRN: [id number],    Date: 09/07/2018  Time Spent: 57 minutes  Treatment Type: Individual Therapy  Reported Symptoms:  Anxious, some depressed mood, stress, tired  Mental Status Exam:  Appearance:   Casual     Behavior:  Appropriate and Sharing  Motor:  Normal  Speech/Language:   Normal Rate  Affect:  Appropriate  Mood:  anxious and depressed  Thought process:  normal  Thought content:    WNL  Sensory/Perceptual disturbances:    WNL  Orientation:  Fully oriented to person, date, place, time, situation  Attention:  Good  Concentration:  Good  Memory:  WNL  Fund of knowledge:   Good  Insight:    Fair  Judgment:   Good  Impulse Control:  Good   Risk Assessment: Danger to Self:  No Self-injurious Behavior: No Danger to Others: No Duty to Warn:no Physical Aggression / Violence:No  Access to Firearms a concern: No  Gang Involvement:No   Subjective:   Patient in today with symptoms of anxiety, stress, some fatigue with some depressed mood.  Spoke about going off meds recently without consulting Dr.  Did talk with Dr. Marlyne BeardsJennings by phone yesterday and is back on meds as prescribed. Had gone off meds reportedy feeling that it was affecting her heart rate, but that was determined not to be a side effect and patient reports no more symptoms re: heart rate and has felt fine.   Having some concerns where she is living in house with 2 other female students. Has spoken with parents about her concerns and they agree that if/when needed, they can help look at other options.  Doesn't really want to have to move back home unless she doesn't find another option.    Some friendship stresses continue as well as stress over her classes. Talked about ways to approach her classes this year in a way that she feels stronger and more confident.  Recognizes she really needs to set limits and have some time just to take a  break and relax. Talked about what she may be able to re-arrange or take off her schedule to have some free time for herself.  [facility] to see what is required in order [facility].  Also commits to getting up a bit earlier and doing Yoga for a few minutes before getting ready for the day.    Interventions: Cognitive Behavioral Therapy and Solution-Oriented/Positive Psychology  Diagnosis:   ICD-10-CM   1. Generalized anxiety disorder F41.1     Plan: Patient to follow through with strategies mentioned above to help with her stress levels, anxiety, and to have some personal time alone which is something important to  patient but she has not had consistently.  Return next wk to continue goal-directed treatment.  Mathis Fareeborah Darek Eifler, Vanessa Doyle

## 2018-09-09 DIAGNOSIS — J028 Acute pharyngitis due to other specified organisms: Secondary | ICD-10-CM | POA: Diagnosis not present

## 2018-09-09 DIAGNOSIS — R52 Pain, unspecified: Secondary | ICD-10-CM | POA: Diagnosis not present

## 2018-09-09 DIAGNOSIS — J029 Acute pharyngitis, unspecified: Secondary | ICD-10-CM | POA: Diagnosis not present

## 2018-09-09 DIAGNOSIS — B9689 Other specified bacterial agents as the cause of diseases classified elsewhere: Secondary | ICD-10-CM | POA: Diagnosis not present

## 2018-09-09 DIAGNOSIS — N912 Amenorrhea, unspecified: Secondary | ICD-10-CM | POA: Diagnosis not present

## 2018-09-13 ENCOUNTER — Ambulatory Visit: Payer: 59 | Admitting: Psychiatry

## 2018-09-13 DIAGNOSIS — F411 Generalized anxiety disorder: Secondary | ICD-10-CM | POA: Diagnosis not present

## 2018-09-13 NOTE — Progress Notes (Signed)
      [facility] Counselor/Therapist Progress Note  Patient ID: Vanessa Doyle, MRN: [id number],    Date: 09/13/2018  Time Spent: 56 minutes  Treatment Type: Individual Therapy  Reported Symptoms:  Anxious, some depressed mood, stress, tired but some improvement  Mental Status Exam:  Appearance:   Casual     Behavior:  Appropriate and Sharing  Motor:  Normal  Speech/Language:   Normal Rate  Affect:  Appropriate  Mood:  Anxious and frustrated (with school)  Thought process:  normal  Thought content:    WNL  Sensory/Perceptual disturbances:    WNL  Orientation:  Fully oriented to person, date, place, time, situation  Attention:  Good  Concentration:  Good  Memory:  WNL  Fund of knowledge:   Good  Insight:    Fair  Judgment:   Fair  Impulse Control:  Good   Risk Assessment: Danger to Self:  No Self-injurious Behavior: No Danger to Others: No Duty to Warn:no Physical Aggression / Violence:No  Access to Firearms a concern: No  Gang Involvement:No   Subjective:   Patient in today with continued symptoms of anxiety, stress, some fatigue with some depressed mood.  Fatigue and depressed mood have both lessened a bit.    Problems with others at the house where she rents a room has improved some.  Biggest issue today is school stress and patient has found it difficult to practice any stress management strategies. Also did not follow through on checking with [employer]le[employer] re: expectations on participation in their activities.  Has not gotten up earlier for yoga as she had mentioned previously---states "I'm still trying to figure that out."  Still feels these are appropriate measures to be aiming for.  Encouraged more motivation to try even small steps.  Worked on [employer].  Specifically focused on motivation and follow through to try different strategies that could help her.  Goal review:  Some difficulty "fitting everything in to my schedule".  Not a lot of progress yet.   Working on improving motivation, seeing what she can control and cannot control.  Is meeting with dept head at school this week about concerns related to school expectations and self-care.  Interventions: Cognitive Behavioral Therapy and Solution-Oriented/Positive Psychology  Diagnosis:   ICD-10-CM   1. Generalized anxiety disorder F41.1     Plan: Patient to follow through with strategies discussed in session to help with her stress levels, anxiety, and to have some personal time alone which is something important to  patient but she has not had consistently.  Needing to work more consistently on follow through and trying different suggestions/strategies.  Return next wk to continue goal-directed treatment.  [name], LCSW

## 2018-09-20 ENCOUNTER — Ambulatory Visit (INDEPENDENT_AMBULATORY_CARE_PROVIDER_SITE_OTHER): Payer: 59 | Admitting: Psychiatry

## 2018-09-20 DIAGNOSIS — F411 Generalized anxiety disorder: Secondary | ICD-10-CM

## 2018-09-20 NOTE — Progress Notes (Addendum)
      [facility] Counselor/Therapist Progress Note  Patient ID: Vanessa Doyle, MRN: [id number],    Date: 09/20/2018  Time Spent: 57 minutes  Treatment Type: Individual Therapy  Reported Symptoms:  Anxious, some depressed mood, stress, less tired  Mental Status Exam:  Appearance:   Casual     Behavior:  Appropriate and Sharing  Motor:  Normal  Speech/Language:   Normal Rate  Affect:  Appropriate  Mood:  Anxious   Thought process:  normal  Thought content:    WNL  Sensory/Perceptual disturbances:    WNL  Orientation:  Fully oriented to person, date, place, time, situation  Attention:  Good  Concentration:  Good  Memory:  WNL  Fund of knowledge:   Good  Insight:    Fair  Judgment:   Fair  Impulse Control:  Good   Risk Assessment: Danger to Self:  No Self-injurious Behavior: No Danger to Others: No Duty to Warn:no Physical Aggression / Violence:No  Access to Firearms a concern: No  Gang Involvement:No   Subjective:   Patient in today with continued symptoms of anxiety, stress, some fatigue with some depressed mood.  Fatigue and depressed mood have both lessened a bit.    Problems with others at the house where she is staying is some better as far as outlook, but no changes are certain.  Her mom spoke with church board that oversees house but nothing has changed thus far.  Patient has found it difficult to practice any stress management strategies, especially at school.  Still feels these are appropriate measures to be aiming for, however is difficult.   Some anxiety is a bit better as she talks back to it .  Works to transform her  thoughts and behaviors, letting go of negative.  A bit more understanding of herself and trying to figure out ways of completing school work ad commitments and other areas is life.     Specifically focused on motivation and follow through to try different strategies that could help her.  Goal review:  Some slight progress.  Working harder   on "things that I can't control and what I can control". Also trying to not over-react.    Interventions: Cognitive Behavioral Therapy and Solution-Oriented/Positive Psychology  Diagnosis:   ICD-10-CM   1. Generalized anxiety disorder F41.1     Plan: Patient to follow through with strategies discussed in session to help with her stress levels, anxiety, and to have some personal time alone which is something important to  patient but she has not had consistently.  Needing to work more consistently on follow through and trying different suggestions/strategies.  Return next wk to continue goal-directed treatment.  Mathis Fareeborah Sayeed Weatherall, LCSW

## 2018-09-27 ENCOUNTER — Ambulatory Visit: Payer: 59 | Admitting: Psychiatry

## 2018-09-27 DIAGNOSIS — F411 Generalized anxiety disorder: Secondary | ICD-10-CM

## 2018-09-27 NOTE — Progress Notes (Signed)
      [facility] Counselor/Therapist Progress Note  Patient ID: Vanessa Doyle, MRN: [id number],    Date: 09/27/2018  Time Spent: 60 minutes  Treatment Type: Individual Therapy  Reported Symptoms:  Anxious, depressed mood (better), stressed  Mental Status Exam:  Appearance:   Casual     Behavior:  Appropriate and Sharing  Motor:  Normal  Speech/Language:   Normal Rate  Affect:  Appropriate  Mood:  Anxious (lessened)  Thought process:  normal  Thought content:    WNL  Sensory/Perceptual disturbances:    WNL  Orientation:  Fully oriented to person, date, place, time, situation  Attention:  Good  Concentration:  Good  Memory:  WNL  Fund of knowledge:   Good  Insight:    Good  Judgment:   Good  Impulse Control:  Good   Risk Assessment: Danger to Self:  No Self-injurious Behavior: No Danger to Others: No Duty to Warn:no Physical Aggression / Violence:No  Access to Firearms a concern: No  Gang Involvement:No   Subjective:   Patient in today with continued symptoms of anxiety and stress, lessened depressed mood. Fatigue has decreased some.  Acknowledges she has gotten some more rest than usual, and appears some more rested as evidenced by less yawning, more interactive, and more energy.      Issues with housemates has improved however leadership and events still disorganized.  Ongoing situations that are not yet resolved.  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 Empowerment rather than feeling she can't make certain changes.  Better understanding of how easy it has been for her to stay stuck with some stressors and anxiety issues. Some progress also noted in efforts to let go of negativity and try to understand what things are in her control and what are not.   A bit more understanding of herself and trying to figure out ways of completing school work ad  commitments and other areas of life.    Continued focus on motivation and recognition and how some of her beliefs and self-messages have contributed to her anxiety issues.  Patient to continue practice of different strategies that help alleviate anxiety and help her develop more personal strength within herself and in interacting with others.   Goal review: More progress noted and discussed with patient.  Interventions: Cognitive Behavioral Therapy and Solution-Oriented/Positive Psychology  Diagnosis:   ICD-10-CM   1. Generalized anxiety disorder F41.1     Plan: Patient to follow through with strategies discussed in session to help with her stress levels, anxiety, and to have some personal time alone which is something important to  patient but she has not had consistently.  Needing to work more Consistently on follow through and trying different suggestions/strategies.  Return next wk to continue goal-directed treatment.  [name], LCSW

## 2018-10-04 ENCOUNTER — Ambulatory Visit: Payer: 59 | Admitting: Psychiatry

## 2018-10-04 DIAGNOSIS — F411 Generalized anxiety disorder: Secondary | ICD-10-CM | POA: Diagnosis not present

## 2018-10-04 NOTE — Progress Notes (Signed)
      [facility] Counselor/Therapist Progress Note  Patient ID: Vanessa Doyle, MRN: [id number],    Date: 10/04/2018  Time Spent: 60 minutes  Treatment Type: Individual Therapy  Reported Symptoms:  Anxious, depressed mood (improving), stressed  Mental Status Exam:  Appearance:   Casual     Behavior:  Appropriate and Sharing  Motor:  Normal  Speech/Language:   Normal Rate  Affect:  Appropriate  Mood:  Anxious (lessened)  Thought process:  normal  Thought content:    WNL  Sensory/Perceptual disturbances:    WNL  Orientation:  Fully oriented to person, date, place, time, situation  Attention:  Good  Concentration:  Good  Memory:  WNL  Fund of knowledge:   Good  Insight:    Good  Judgment:   Good  Impulse Control:  Good   Risk Assessment: Danger to Self:  No Self-injurious Behavior: No Danger to Others: No Duty to Warn:no Physical Aggression / Violence:No  Access to Firearms a concern: No  Gang Involvement:No   Subjective:   Patient in today with continued symptoms of anxiety and stress, lessened depressed mood. Fatigue is more present this week.  Very hard for her to balance school and self-care. Does well at times and then gets overwhelmed when not setting good limits.  Worked on this some more today.       Continued work on Optician, dispensing and anxiety, a lot of which is school related.  Reviewed strategies for anxiety reduction and had patient to think of times when she's been anxious and was able to do something that helped alleviate the anxiety.  Emphasizing empowerment rather than feeling she can't make certain changes.  Better understanding of how easy it has been for her to stay stuck with some stressors and anxiety issues. Some progress also noted in efforts to let go of negativity and try to understand what things are in her control and what are not, And accept that. Worked on some of her automatic thoughts that tend to be negative or fear-based.    Continued  focus on motivation and recognition and how some of her beliefs and self-messages have contributed to her anxiety issues.  Patient to continue practice of different strategies that help alleviate anxiety and help her develop more personal strength within herself and in interacting with others.   Goal review: More progress noted, as well as increased difficulty with trust, and discussed with patient.  Interventions: Cognitive Behavioral Therapy and Solution-Oriented/Positive Psychology  Diagnosis:   ICD-10-CM   1. Generalized anxiety disorder F41.1     Plan: Patient to follow through with strategies discussed in session to help with her stress levels, anxiety, and to have some personal time alone which is something important to  patient but she has not had consistently.  Needing to work more Consistently on follow through and trying different suggestions/strategies.  Return next wk to continue goal-directed treatment.  [name], LCSW

## 2018-10-08 DIAGNOSIS — G4700 Insomnia, unspecified: Secondary | ICD-10-CM | POA: Diagnosis not present

## 2018-10-08 DIAGNOSIS — E663 Overweight: Secondary | ICD-10-CM | POA: Diagnosis not present

## 2018-10-11 ENCOUNTER — Ambulatory Visit: Payer: 59 | Admitting: Psychiatry

## 2018-10-11 DIAGNOSIS — F411 Generalized anxiety disorder: Secondary | ICD-10-CM | POA: Diagnosis not present

## 2018-10-11 NOTE — Progress Notes (Signed)
[facility] Counselor/Therapist Progress Note  Patient ID: Vanessa Doyle, MRN: [id number],    Date: 10/11/2018  Time Spent: 60 minutes  Treatment Type: Individual Therapy  Reported Symptoms:  Anxious, depressed mood (improving), stressed, tired  Mental Status Exam:  Appearance:   Casual     Behavior:  Appropriate and Sharing  Motor:  Normal  Speech/Language:   Normal Rate  Affect:  Appropriate  Mood:  Anxious (lessened)  Thought process:  normal  Thought content:    WNL  Sensory/Perceptual disturbances:    WNL  Orientation:  Fully oriented to person, date, place, time, situation  Attention:  Good  Concentration:  Good  Memory:  WNL  Fund of knowledge:   Good  Insight:    Good  Judgment:   Good  Impulse Control:  Good   Risk Assessment: Danger to Self:  No Self-injurious Behavior: No Danger to Others: No Duty to Warn:no Physical Aggression / Violence:No  Access to Firearms a concern: No  Gang Involvement:No   Subjective:  Patient in today stating "it's just been a rough week".  More tired and some of this is due to "I can't take my night meds when the other housemates aren't home yet."  Also reports forgetting morning meds this a.m. and doesn't have meds with her.  Leaves from appt today and will be in classes at comm[employer] college. Patient tired and tearful at times.  States there's nothing she can change as she has to be at classes, and has homework that also keeps her up later so can't take meds then either. Encouraged her to try and make her meds more of a priority as possible as her mental health is so important to all the other areas of her like.    Very hard for her to balance school and self-care and difficult for others to offer ideas for change as she feels none of them are do-able.  Functions better at times and then gets overwhelmed when not setting good limits.  Worked on this some more today.       Continued work on Optician, dispensing and anxiety, a lot of  which is school related.  Reviewed strategies for anxiety reduction and had patient to think of times when she's been able to better manage her anxiety and she reports when she does cleaning chores that has sometimes helped.  Try to [facility] empowerment rather than feeling she can't make certain changes. Worked on some of her automatic thoughts that tend to be negative or fear-based.    Continued focus on motivation and recognition and how some of her beliefs and self-messages have contributed to her anxiety issues.  Patient to continue practice of different strategies that help alleviate anxiety and help her develop more personal strength within herself and in interacting with others.   Goal review per goals on flowsheet.  Interventions: Cognitive Behavioral Therapy and Solution-Oriented/Positive Psychology  Diagnosis:   ICD-10-CM   1. Generalized anxiety disorder F41.1     Plan: Patient to follow through with strategies discussed in session to help with her stress levels, anxiety, and to have some personal time alone which is something important to  patient but she has not had consistently.  Needing to work more Consistently on follow through and trying different suggestions/strategies.  Return next wk to continue goal-directed treatment.  [name], LCSW                  [facility] Counselor/Therapist Progress Note  Patient ID: Vanessa Doyle, MRN: [id number],    Date: 10/11/2018  Time Spent: 60 minutes  Treatment Type: Individual Therapy  Reported Symptoms:  Anxious, depressed mood (improving), stressed  Mental Status Exam:  Appearance:   Casual     Behavior:  Appropriate and Sharing  Motor:  Normal  Speech/Language:   Normal Rate  Affect:  Appropriate  Mood:  Anxious (lessened)  Thought process:  normal  Thought content:    WNL  Sensory/Perceptual disturbances:    WNL  Orientation:  Fully oriented to person, date, place, time, situation  Attention:  Good   Concentration:  Good  Memory:  WNL  Fund of knowledge:   Good  Insight:    Good  Judgment:   Good  Impulse Control:  Good   Risk Assessment: Danger to Self:  No Self-injurious Behavior: No Danger to Others: No Duty to Warn:no Physical Aggression / Violence:No  Access to Firearms a concern: No  Gang Involvement:No   Subjective:   Patient in today with continued symptoms of anxiety and stress, lessened depressed mood. Fatigue is more present this week.  Very hard for her to balance school and self-care. Does well at times and then gets overwhelmed when not setting good limits.  Worked on this some more today.       Continued work on Optician, dispensing and anxiety, a lot of which is school related.  Reviewed strategies for anxiety reduction and had patient to think of times when she's been anxious and was able to do something that helped alleviate the anxiety.  Emphasizing empowerment rather than feeling she can't make certain changes.  Better understanding of how easy it has been for her to stay stuck with some stressors and anxiety issues. Some progress also noted in efforts to let go of negativity and try to understand what things are in her control and what are not, And accept that. Worked on some of her automatic thoughts that tend to be negative or fear-based.    Continued focus on motivation and recognition and how some of her beliefs and self-messages have contributed to her anxiety issues.  Patient to continue practice of different strategies that help alleviate anxiety and help her develop more personal strength within herself and in interacting with others.   Goal review: More progress noted, as well as increased difficulty with trust, and discussed with patient.  Interventions: Cognitive Behavioral Therapy and Solution-Oriented/Positive Psychology  Diagnosis:   ICD-10-CM   1. Generalized anxiety disorder F41.1     Plan: Patient to follow through with strategies discussed  in session to help with her stress levels, anxiety, and to have some personal time alone which is something important to  patient but she has not had consistently.  Needing to work more Consistently on follow through and trying different suggestions/strategies.  Return next wk to continue goal-directed treatment.  [name], LCSW

## 2018-10-18 ENCOUNTER — Ambulatory Visit: Payer: 59 | Admitting: Psychiatry

## 2018-10-18 DIAGNOSIS — F411 Generalized anxiety disorder: Secondary | ICD-10-CM

## 2018-10-18 NOTE — Progress Notes (Signed)
[facility] Counselor/Therapist Progress Note  Patient ID: Vanessa Doyle, MRN: [id number],    Date: 10/18/2018  Time Spent: 60 minutes  Treatment Type: Individual Therapy  Reported Symptoms:  Anxious, depressed mood (improving), stressed, tired (patient states this is mostly due to her school schedule and that there's nothing she can change)  Mental Status Exam:  Appearance:   Casual     Behavior:  Appropriate and Sharing  Motor:  Normal  Speech/Language:   Normal Rate  Affect:  Anxious,   Mood:  Anxious   Thought process:  normal  Thought content:    WNL  Sensory/Perceptual disturbances:    WNL  Orientation:  Fully oriented to person, date, place, time, situation  Attention:  Good  Concentration:  Good  Memory:  WNL  Fund of knowledge:   Good  Insight:    Good  Judgment:   Good  Impulse Control:  Good   Risk Assessment: Danger to Self:  No Self-injurious Behavior: No Danger to Others: No Duty to Warn:no Physical Aggression / Violence:No  Access to Firearms a concern: No  Gang Involvement:No   Subjective:  Patient in today reporting her symptoms remain about the same, with some increase in stress due to midterm exams Encouraged her to try and make her meds more of a priority as possible as her mental health is so important to all the other areas of her like.    Very hard for her to balance school and self-care and difficult for others to offer ideas for change as she feels none of them are do-able.   Worked heavily on positive self-talk as per her treatment goal plan as well as continued work on Optician, dispensing and anxiety, a lot of which is school related.  Reviewed strategies for anxiety reduction and had patient to think of times when she's been able to better manage her anxiety and she only reports when she does cleaning chores that has sometimes helped.   Continued work to Programmer, multimedia and  Ability rather than feeling she can't make certain  changes. Worked on some of her automatic thoughts that tend to be negative or fear-based.    Patient to continue practice of different strategies that help alleviate anxiety and help her develop more personal strength within herself and in interacting with others. Sometimes assumes she doesn't "measure up" with peers, often mirroring how she talks to herself.  Goal review per goals on flowsheet. Some improvement and trying to help patient realize her strengths  Interventions: Cognitive Behavioral Therapy and Solution-Oriented/Positive Psychology  Diagnosis:   ICD-10-CM   1. Generalized anxiety disorder F41.1     Plan: Patient to follow through with strategies discussed in session to help with her stress levels, anxiety, and to have some personal time alone which is something important to  patient but she has not had consistently.  Needing to work more Consistently on follow through and trying different suggestions/strategies, especially changing her self-talk to be more positive.  Return next wk to continue goal-directed treatment.  [name], LCSW                  [facility] Counselor/Therapist Progress Note  Patient ID: Vanessa Doyle, MRN: [id number],    Date: 10/18/2018  Time Spent: 60 minutes  Treatment Type: Individual Therapy  Reported Symptoms:  Anxious, depressed mood (improving), stressed  Mental Status Exam:  Appearance:   Casual     Behavior:  Appropriate and Sharing  Motor:  Normal  Speech/Language:   Normal Rate  Affect:  Appropriate  Mood:  Anxious (lessened)  Thought process:  normal  Thought content:    WNL  Sensory/Perceptual disturbances:    WNL  Orientation:  Fully oriented to person, date, place, time, situation  Attention:  Good  Concentration:  Good  Memory:  WNL  Fund of knowledge:   Good  Insight:    Good  Judgment:   Good  Impulse Control:  Good   Risk Assessment: Danger to Self:  No Self-injurious Behavior: No Danger to Others:  No Duty to Warn:no Physical Aggression / Violence:No  Access to Firearms a concern: No  Gang Involvement:No   Subjective:   Patient in today with continued symptoms of anxiety and stress, lessened depressed mood. Fatigue is more present this week.  Very hard for her to balance school and self-care. Does well at times and then gets overwhelmed when not setting good limits.  Worked on this some more today.       Continued work on Optician, dispensing and anxiety, a lot of which is school related.  Reviewed strategies for anxiety reduction and had patient to think of times when she's been anxious and was able to do something that helped alleviate the anxiety.  Emphasizing empowerment rather than feeling she can't make certain changes.  Better understanding of how easy it has been for her to stay stuck with some stressors and anxiety issues. Some progress also noted in efforts to let go of negativity and try to understand what things are in her control and what are not, And accept that. Worked on some of her automatic thoughts that tend to be negative or fear-based.    Continued focus on motivation and recognition and how some of her beliefs and self-messages have contributed to her anxiety issues.  Patient to continue practice of different strategies that help alleviate anxiety and help her develop more personal strength within herself and in interacting with others.   Goal review: More progress noted, as well as increased difficulty with trust, and discussed with patient.  Interventions: Cognitive Behavioral Therapy and Solution-Oriented/Positive Psychology  Diagnosis:   ICD-10-CM   1. Generalized anxiety disorder F41.1     Plan: Patient to follow through with strategies discussed in session to help with her stress levels, anxiety, and to have some personal time alone which is something important to  patient but she has not had consistently.  Needing to work more Consistently on follow through and  trying different suggestions/strategies.  Return next wk to continue goal-directed treatment.  [name], LCSW

## 2018-10-25 ENCOUNTER — Ambulatory Visit: Payer: 59 | Admitting: Psychiatry

## 2018-10-26 ENCOUNTER — Ambulatory Visit: Payer: 59 | Admitting: Psychiatry

## 2018-10-26 ENCOUNTER — Other Ambulatory Visit: Payer: Self-pay

## 2018-10-26 DIAGNOSIS — F411 Generalized anxiety disorder: Secondary | ICD-10-CM

## 2018-10-26 NOTE — Progress Notes (Signed)
[facility] Counselor/Therapist Progress Note  Patient ID: Vanessa Doyle, MRN: [id number],    Date: 10/26/2018  Time Spent: 58 minutes  Treatment Type: Individual Therapy  Reported Symptoms:  Anxious, depressed mood (improving), stressed, tired (patient states this is mostly due to her school schedule and that there's nothing she can change---Trying to engage patient in this and see possibilities for change.)  Mental Status Exam:  Appearance:   Casual     Behavior:  Appropriate and Sharing  Motor:  Normal  Speech/Language:   Normal Rate  Affect:  Anxious,   Mood:  Anxious   Thought process:  normal  Thought content:    WNL  Sensory/Perceptual disturbances:    WNL  Orientation:  Fully oriented to person, date, place, time, situation  Attention:  Good  Concentration:  Good  Memory:  WNL  Fund of knowledge:   Good  Insight:    Good  Judgment:   Good  Impulse Control:  Good   Risk Assessment: Danger to Self:  No Self-injurious Behavior: No Danger to Others: No Duty to Warn:no Physical Aggression / Violence:No  Access to Firearms a concern: No  Gang Involvement:No   Subjective:   Patient very anxious about the current coronavirus situation and very openly and tearfully shared her concerns and fears.  Fears included: what will happen with her schooling, lack of knowledge, realizing how much she doesn't know, and wonders about the unknowns.  "The unknowns are the worst thing to deal with."  We processed what this meant for her and she became less tearful as she spoke and felt listened to.  Discussed the difficulty for her to balance school and self-care and difficult for others to offer ideas for change as she feels none of them are do-able.  Patient still feels there are not answers for this, and I expressed concern that she not lock herself into thinking there's no room for changes. .  Continued focus on positive self-talk as per her treatment goal plan as well as  continued work on stress management and anxiety, a lot of which is school related. Continued work to Programmer, multimedia and  Ability rather than feeling she can't make certain changes. Worked on some of her automatic thoughts that tend to be negative or fear-based.    Patient to continue practice of different strategies that help alleviate anxiety and help her develop more personal strength within herself and in interacting with others.   Goal review per goals on flowsheet. Some improvement and trying to help patient realize her strengths and understand how her beliefs affect her willingness to change.  Interventions: Cognitive Behavioral Therapy and Solution-Oriented/Positive Psychology  Diagnosis:   ICD-10-CM   1. Generalized anxiety disorder F41.1     Plan: Plan remains that Patient to follow through with strategies discussed in session to help with her stress levels, anxiety, and to have some personal time alone which is something important to  patient but she has not had consistently.  Needing to work more Consistently on follow through and trying different suggestions/strategies, especially changing her self-talk to be more positive.  Return next wk to continue goal-directed treatment.  [name], LCSW                  [facility] Counselor/Therapist Progress Note  Patient ID: Vanessa Doyle, MRN: [id number],    Date: 10/26/2018  Time Spent: 60 minutes  Treatment Type: Individual Therapy  Reported Symptoms:  Anxious, depressed mood (improving),  stressed  Mental Status Exam:  Appearance:   Casual     Behavior:  Appropriate and Sharing  Motor:  Normal  Speech/Language:   Normal Rate  Affect:  Appropriate  Mood:  Anxious (lessened)  Thought process:  normal  Thought content:    WNL  Sensory/Perceptual disturbances:    WNL  Orientation:  Fully oriented to person, date, place, time, situation  Attention:  Good  Concentration:  Good  Memory:  WNL  Fund of  knowledge:   Good  Insight:    Good  Judgment:   Good  Impulse Control:  Good   Risk Assessment: Danger to Self:  No Self-injurious Behavior: No Danger to Others: No Duty to Warn:no Physical Aggression / Violence:No  Access to Firearms a concern: No  Gang Involvement:No   Subjective:   Patient in today with continued symptoms of anxiety and stress, lessened depressed mood. Fatigue is more present this week.  Very hard for her to balance school and self-care. Does well at times and then gets overwhelmed when not setting good limits.  Worked on this some more today.       Continued work on Optician, dispensing and anxiety, a lot of which is school related.  Reviewed strategies for anxiety reduction and had patient to think of times when she's been anxious and was able to do something that helped alleviate the anxiety.  Emphasizing empowerment rather than feeling she can't make certain changes.  Better understanding of how easy it has been for her to stay stuck with some stressors and anxiety issues. Some progress also noted in efforts to let go of negativity and try to understand what things are in her control and what are not, And accept that. Worked on some of her automatic thoughts that tend to be negative or fear-based.    Continued focus on motivation and recognition and how some of her beliefs and self-messages have contributed to her anxiety issues.  Patient to continue practice of different strategies that help alleviate anxiety and help her develop more personal strength within herself and in interacting with others.   Goal review: More progress noted, as well as increased difficulty with trust, and discussed with patient.  Interventions: Cognitive Behavioral Therapy and Solution-Oriented/Positive Psychology  Diagnosis:   ICD-10-CM   1. Generalized anxiety disorder F41.1     Plan: Patient to follow through with strategies discussed in session to help with her stress levels,  anxiety, and to have some personal time alone which is something important to  patient but she has not had consistently.  Needing to work more Consistently on follow through and trying different suggestions/strategies.  Return next wk to continue goal-directed treatment.  [name], LCSW

## 2018-10-29 ENCOUNTER — Other Ambulatory Visit: Payer: Self-pay

## 2018-10-29 ENCOUNTER — Ambulatory Visit (INDEPENDENT_AMBULATORY_CARE_PROVIDER_SITE_OTHER): Payer: 59 | Admitting: Psychiatry

## 2018-10-29 DIAGNOSIS — F411 Generalized anxiety disorder: Secondary | ICD-10-CM | POA: Diagnosis not present

## 2018-10-29 NOTE — Progress Notes (Signed)
         [facility] Counselor/Therapist Progress Note  Patient ID: Vanessa Doyle, MRN: [id number],    Date: 10/29/2018  Time Spent: 50 minutes  Treatment Type: Individual Therapy  Reported Symptoms:  Anxious, depressed mood (improving), stressed  Mental Status Exam:  Appearance:   Casual     Behavior:  Appropriate and Sharing  Motor:  Normal  Speech/Language:   Normal Rate  Affect:  Anxious, tearful  Mood:  Anxious   Thought process:  normal  Thought content:    WNL  Sensory/Perceptual disturbances:    WNL  Orientation:  Fully oriented to person, date, place, time, situation  Attention:  Good  Concentration:  Good  Memory:  WNL  Fund of knowledge:   Good  Insight:    Good  Judgment:   Good  Impulse Control:  Good   Risk Assessment: Danger to Self:  No Self-injurious Behavior: No Danger to Others: No Duty to Warn:no Physical Aggression / Violence:No  Access to Firearms a concern: No  Gang Involvement:No   Subjective:   Patient in today with heightened symptoms of anxiety and stress, and depressed mood.  Tearful, fearful, very anxious and jumping into uncertainty and afraid of "what may happen" re: coronavirus situation.  On scale of 1-10, she rates herself a "10", and a "8" on depression scale.  Denies any SI / HI.  Doesn't remember if she took her meds past 2 nights or not.  "Stressed".  Worked with her on a predictable routine in taking her meds.  Reviewed limit setting with patient as she tends to get overwhelmed when she does not set limits.     Learned night before last that she had to quickly vacate her apt and move back home due to student housing closing. This felt very abrupt to patient and she had not prepared for this, and seemed to personalize it.  Discussed how it's not a personal decision but rather a decision made by school and house sponsors for the safety of all during this time of the coronavirus.  Reviewed strategies for anxiety reduction and had  patient to think of times when she's been anxious and was able to do something that helped alleviate the anxiety.  Emphasizing empowerment rather than feeling she can't make certain changes. Worked on some of her automatic thoughts that tend to be negative or fear-based.    Confronted some self-negating. Patient to continue practice of different strategies that help alleviate anxiety and help her develop more personal strength within herself and in interacting with others.   At session end, tearfulness had stopped, and patient is more calm and affect is some brighter.  Interventions: Cognitive Behavioral Therapy and Solution-Oriented/Positive Psychology  Diagnosis:   ICD-10-CM   1. Generalized anxiety disorder F41.1     Plan: Patient to follow through with strategies (especially CBT exercises) discussed in session to help with her stress levels, anxiety, and to have some personal time alone which is something important to  patient but difficult in current living situation.  Return next wk to continue goal-directed treatment.  [name], LCSW

## 2018-11-01 ENCOUNTER — Ambulatory Visit: Payer: 59 | Admitting: Psychiatry

## 2018-11-01 ENCOUNTER — Other Ambulatory Visit: Payer: Self-pay

## 2018-11-01 DIAGNOSIS — F411 Generalized anxiety disorder: Secondary | ICD-10-CM

## 2018-11-01 NOTE — Progress Notes (Signed)
         [facility] Counselor/Therapist Progress Note  Patient ID: Vanessa Doyle, MRN: [id number],    Date: 11/01/2018  Time Spent: 50 minutes  Treatment Type: Individual Therapy  Reported Symptoms:  Anxious, depressed mood, stressed dealing with uncertainties  Mental Status Exam:  Appearance:   Casual     Behavior:  Appropriate and Sharing  Motor:  Normal  Speech/Language:   Normal Rate  Affect:  Anxious  Mood:  Anxious   Thought process:  normal  Thought content:    WNL  Sensory/Perceptual disturbances:    WNL  Orientation:  Fully oriented to person, date, place, time, situation  Attention:  Good  Concentration:  Good  Memory:  WNL  Fund of knowledge:   Good  Insight:    Good  Judgment:   Good  Impulse Control:  Good   Risk Assessment: Danger to Self:  No Self-injurious Behavior: No Danger to Others: No Duty to Warn:no Physical Aggression / Violence:No  Access to Firearms a concern: No  Gang Involvement:No   Subjective:   Patient in today with increased symptoms of anxiety and stress, and depressed mood although not as depressed as previously.  Anxious and looking ahead, fearful of "what may happen" re: coronavirus situation.  On anxiety scale of 1-10, she rates herself a "9", and a "7" on depression scale which reflects some slight improvement since last appt. Denies any SI / HI.  Has been taking her meds since last appt and reinforced her being regular with her meds.   Reviewed limit setting with patient as she tends to get overwhelmed when she does not set limits.     Concerned that she doesn't know what to expect with her schooling and frustrated that she can't control that.  Discussed the feelings of not being in control of some things and how to better manage those feelings, as well as discerning and accepting what things are in her control and what things are not.  Reviewed with patient strategies (including CBT)  to help with her anxiety, depression, and  overall stress.  Her symptoms are some better today so we reflected on how she has managed things this past week and how she needs to continue with some of those ways of approaching things, even though this is challenging for patient. Highlighted empowerment for her, and to work on scaling back her negative automatic thoughts that are self-limiting.     Interventions: Cognitive Behavioral Therapy and Solution-Oriented/Positive Psychology  Diagnosis:   ICD-10-CM   1. Generalized anxiety disorder F41.1     Plan: Patient to follow through with strategies (especially CBT exercises) discussed in session to help with her stress levels, anxiety, and to have some personal time alone which is something important to  patient but difficult in current living situation.  Return next wk to continue goal-directed treatment.  [name], LCSW

## 2018-11-04 ENCOUNTER — Other Ambulatory Visit: Payer: Self-pay

## 2018-11-04 ENCOUNTER — Ambulatory Visit (INDEPENDENT_AMBULATORY_CARE_PROVIDER_SITE_OTHER): Payer: 59 | Admitting: Psychiatry

## 2018-11-04 DIAGNOSIS — F411 Generalized anxiety disorder: Secondary | ICD-10-CM | POA: Diagnosis not present

## 2018-11-04 NOTE — Progress Notes (Signed)
[facility] Counselor/Therapist Progress Note  Patient ID: Vanessa Doyle, MRN: [id number],    Date: 11/04/2018  Time Spent: 60 minutes 12:55pm to 1:55pm  Treatment Type: Individual Therapy   Virtual Visit via Telephone Note I connected with patient by a video enabled telemedicine application or telephone, with their informed consent, and verified patient privacy and that I am speaking with the correct person using two identifiers. I am at [facility] and patient is at her home.   I discussed the limitations, risks, security and privacy concerns of performing psychotherapy and management service by telephone and the availability of in person appointments. I also discussed with the patient that there may be a patient responsible charge related to this service. The patient expressed understanding and agreed to proceed.  I discussed the treatment planning with the patient. The patient was provided an opportunity to ask questions and all were answered. The patient agreed with the plan and demonstrated an understanding of the instructions.   The patient was advised to call  our office if symptoms worsen or feel they are in a crisis state and need immediate contact.   Reported Symptoms:  Anxious, depressed mood, stressed dealing with uncertainties  Mental Status Exam:  Appearance:   n/a    Behavior:  sharing  Motor:  Normal  Speech/Language:   Normal Rate  Affect:  n/a  Mood:  Anxious   Thought process:  normal  Thought content:    WNL  Sensory/Perceptual disturbances:    WNL  Orientation:  Fully oriented to person, date, place, time, situation  Attention:  Good  Concentration:  Good  Memory:  WNL  Fund of knowledge:   Good  Insight:    Good  Judgment:   Good  Impulse Control:  Good   Risk Assessment: Danger to Self:  No Self-injurious Behavior: No Danger to Others: No Duty to Warn:no Physical Aggression / Violence:No  Access to Firearms a concern: No   Gang Involvement:No   Subjective:   Patient reports today some decrease in her anxiety and depression.  She has heard from her school and they are now able to pick back up on some of their assignments so the increased communication from school has helped her feel connected and purposeful as she accomplishes things for her classes.   Specific talk about her fears/anxietiy and how all the virus issues have exacerbated.  Tendency to jump ahead and worry over things in the future and often it relates to things that are out of her control such as whether the virus health concerns are going to last much longer or end sooner.  Very difficult for patient to not feel in control of most things.  Pursued the issue of being able to cope with the unknown and when things are not  In her control and how to develop some tolerance of not being in control.   On anxiety scale of 1-10, she rates herself a "8", and a "6" on depression scale which reflects some slight improvement since last appt. Denies any SI / HI.  Has been taking her meds since last appt and reinforced her being regular with her meds.    Strategies (including CBT) were reviewed with patient to help with her anxiety, depression, and overall stress.  Her depressive symptoms are some better today so we reflected on how she has managed things this past week and how she needs to continue with some of those ways of  approaching things, even though this is challenging for patient. She does sound a bit stronger today and actually engaged well.  Highlighted empowerment for her, and to work on scaling back her negative automatic thoughts that are self-limiting which she continues to work on.  Interventions: Cognitive Behavioral Therapy and Solution-Oriented/Positive Psychology  Diagnosis:   ICD-10-CM   1. Generalized anxiety disorder F41.1     Plan: Patient to follow through with strategies (especially CBT exercises) discussed in session to help with her  stress levels, anxiety, depression, and to have some personal time alone which is something important to  patient but difficult in current living situation.  Next session will be in 1 wk to continue goal-directed treatment.  [name], LCSW

## 2018-11-10 ENCOUNTER — Other Ambulatory Visit: Payer: Self-pay

## 2018-11-10 ENCOUNTER — Ambulatory Visit (INDEPENDENT_AMBULATORY_CARE_PROVIDER_SITE_OTHER): Payer: 59 | Admitting: Psychiatry

## 2018-11-10 DIAGNOSIS — F411 Generalized anxiety disorder: Secondary | ICD-10-CM

## 2018-11-10 NOTE — Progress Notes (Signed)
[facility] Counselor/Therapist Progress Note  Patient ID: Vanessa Doyle, MRN: [id number],    Date: 11/10/2018  Time Spent: 60 minutes     8:50am to 9:50am  Treatment Type: Individual Therapy   Virtual Visit via Telephone Note I connected with patient by a video enabled telemedicine application or telephone, with their informed consent, and verified patient privacy and that I am speaking with the correct person using two identifiers. I am at [facility] and patient is at her home.   I discussed the limitations, risks, security and privacy concerns of performing psychotherapy and management service by telephone and the availability of in person appointments. I also discussed with the patient that there may be a patient responsible charge related to this service. The patient expressed understanding and agreed to proceed.  I discussed the treatment planning with the patient. The patient was provided an opportunity to ask questions and all were answered. The patient agreed with the plan and demonstrated an understanding of the instructions.   The patient was advised to call  our office if symptoms worsen or feel they are in a crisis state and need immediate contact.   Reported Symptoms:  Anxious, depressed mood, stressed dealing with uncertainties  Mental Status Exam:  Appearance:   n/a    Behavior:  sharing  Motor:  Normal  Speech/Language:   Normal Rate  Affect:  n/a  Mood:  Anxious   Thought process:  normal  Thought content:    WNL  Sensory/Perceptual disturbances:    WNL  Orientation:  Fully oriented to person, date, place, time, situation  Attention:  Good  Concentration:  Good  Memory:  WNL  Fund of knowledge:   Good  Insight:    Good  Judgment:   Good  Impulse Control:  Good   Risk Assessment: Danger to Self:  No Self-injurious Behavior: No Danger to Others: No Duty to Warn:no Physical Aggression / Violence:No  Access to Firearms a concern: No   Gang Involvement:No   Subjective:   Patient reports today that her anxiety and depression has been a bit worse past few days "but not worse than it was previously".  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  Feels like they are doing some online lectures but not really getting what we need to be learning. Frustration tolerance remains very difficult for patient so we focused on it heavily today and need to continue as this relates to most areas of her life.  Processed more about her fears/anxietiy and how all the virus issues have exacerbated.  Tendency to jump ahead and worry over things in the future and often it relates to things that are out of her control. Reviewed the issue of being able to cope with the unknown and when things are not  In her control and how to develop some tolerance of not being in control.  Patient also suffering with the social isolation and feeling more lonely.  On anxiety scale of 1-10, she rates herself almost a "10", and a "7" on depression scale which reflects some increase since last appt. Denies any SI / HI.  Has not been taking her meds regularly since last appt and ends up feeling more tired.  Encouraged her to take meds regularly.  Denies andy SI.    Strategies (including CBT) were reviewed with patient to help  with her anxiety, depression, and overall stress. Highlighted empowerment for her, and to work on scaling back her negative automatic thoughts that are self-limiting which she continues to work on.   Interventions: Cognitive Behavioral Therapy and Solution-Oriented/Positive Psychology  Diagnosis:   ICD-10-CM   1. Generalized anxiety disorder F41.1     Plan: Patient to follow through with strategies (especially CBT exercises) discussed in session to help with her stress levels, anxiety, depression, and to have some personal time  alone which is something important to  patient but difficult in current living situation.  Next session will be in 1 wk to continue goal-directed treatment.  [name], LCSW

## 2018-11-15 ENCOUNTER — Other Ambulatory Visit: Payer: Self-pay

## 2018-11-15 ENCOUNTER — Ambulatory Visit (INDEPENDENT_AMBULATORY_CARE_PROVIDER_SITE_OTHER): Payer: 59 | Admitting: Psychiatry

## 2018-11-15 DIAGNOSIS — F411 Generalized anxiety disorder: Secondary | ICD-10-CM

## 2018-11-15 NOTE — Progress Notes (Addendum)
[facility] Counselor/Therapist Progress Note  Patient ID: Vanessa Doyle, MRN: [id number],    Date: 11/15/2018  Time Spent: 60 minutes     8:55am to 9:55am  Treatment Type: Individual Therapy   Virtual Visit via  Video/Telephone Note: Patient tried really hard several times to connect via Video but patient was unable to do it, so we used telephone. I connected with patient by a video enabled telemedicine application or telephone, with their informed consent, and verified patient privacy and that I am speaking with the correct person using two identifiers. I am at [facility] and patient is at her home.   I discussed the limitations, risks, security and privacy concerns of performing psychotherapy and management service by telephone and the availability of in person appointments. I also discussed with the patient that there may be a patient responsible charge related to this service. The patient expressed understanding and agreed to proceed.  I discussed the treatment planning with the patient. The patient was provided an opportunity to ask questions and all were answered. The patient agreed with the plan and demonstrated an understanding of the instructions.   The patient was advised to call  our office if symptoms worsen or feel they are in a crisis state and need immediate contact.   Reported Symptoms:  Anxious, stressed dealing with uncertainties, overthinking  Mental Status Exam:  Appearance:   n/a    Behavior:  sharing  Motor:  Normal  Speech/Language:   Normal Rate  Affect:  n/a  Mood:  Anxious (although lessened some this past week)  Thought process:  normal  Thought content:    WNL  Sensory/Perceptual disturbances:    WNL  Orientation:  Fully oriented to person, date, place, time, situation  Attention:  Good  Concentration:  Good  Memory:  WNL  Fund of knowledge:   Good  Insight:    Good  Judgment:   Good  Impulse Control:  Good   Risk  Assessment: Danger to Self:  No Self-injurious Behavior: No Danger to Others: No Duty to Warn:no Physical Aggression / Violence:No  Access to Firearms a concern: No  Gang Involvement:No   Subjective:   Patient reports today that this past week has had moments that were a bit better in regards to anxiety and depression. 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  Not as negative today re: all the issues with school schedule being affected by the pandemic currently going on.  Talked about her fears/anxiety/"worries" about the coronavirus issues.  Still has the tendency to jump ahead and worry/stress however did a little better with that this week, as well as not stressing quite as much over the issues which she has no control.  Finding the social isolation not to be as bad this past week. Got out this weekend some with family and weekend was better.    Discussed med and her not taking it, says that she has so much school work that she can't go to bed earlier like she needs to do when she takes her medication.  But ends up feeling quite tired a lot, and had said previously when she took her meds some, that it helped her get better rest.  I asked if there was a reason she didn't want to take it and she said only because she can't stay up as late when she takes it. 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 Reviewed CBT strategies for managing tension and anxiety as well as things she did this past week, such as getting out in safe open areas, that seemed to help anxiety as well.   Again highlighted empowerment for her, and to work on scaling back her negative automatic thoughts that are self-limiting.  Interventions: Cognitive Behavioral Therapy and Solution-Oriented/Positive Psychology  Diagnosis:   ICD-10-CM   1. Generalized anxiety disorder  F41.1     Plan: Patient to follow through with strategies (especially CBT exercises) discussed in session to help with her stress and anxiety, and to have some personal time alone which is something important to  patient but difficult in current living situation.  Next session will be in 1 wk to continue goal-directed treatment.  [name], LCSW

## 2018-11-17 ENCOUNTER — Other Ambulatory Visit: Payer: Self-pay

## 2018-11-17 ENCOUNTER — Ambulatory Visit (INDEPENDENT_AMBULATORY_CARE_PROVIDER_SITE_OTHER): Payer: 59 | Admitting: Psychiatry

## 2018-11-17 DIAGNOSIS — F411 Generalized anxiety disorder: Secondary | ICD-10-CM

## 2018-11-17 NOTE — Progress Notes (Signed)
[facility] Counselor/Therapist Progress Note  Patient ID: Vanessa Doyle, MRN: [id number],    Date: 11/17/2018  Time Spent: 60 minutes     8:00am to 9:00am  Treatment Type: Individual Therapy   Virtual Visit via  Video Note: Was able to establish video connection I connected with patient by a video enabled telemedicine application or telephone, with their informed consent, and verified patient privacy and that I am speaking with the correct person using two identifiers. I am at [facility] and patient is at her home.   I discussed the limitations, risks, security and privacy concerns of performing psychotherapy and management service by telephone and the availability of in person appointments. I also discussed with the patient that there may be a patient responsible charge related to this service. The patient expressed understanding and agreed to proceed.  I discussed the treatment planning with the patient. The patient was provided an opportunity to ask questions and all were answered. The patient agreed with the plan and demonstrated an understanding of the instructions.   The patient was advised to call  our office if symptoms worsen or feel they are in a crisis state and need immediate contact.   Reported Symptoms:  Anxious, stressed dealing with uncertainties, overthinking, "family crisis that began evening of 11/15/2018"  Mental Status Exam:  Appearance:   n/a    Behavior:  sharing  Motor:  Normal  Speech/Language:   Normal Rate  Affect:  n/a  Mood:  Anxious (although lessened some this past week)  Thought process:  normal  Thought content:    WNL  Sensory/Perceptual disturbances:    WNL  Orientation:  Fully oriented to person, date, place, time, situation  Attention:  Good  Concentration:  Good  Memory:  WNL  Fund of knowledge:   Good  Insight:    Good  Judgment:   Good  Impulse Control:  Good   Risk Assessment: Danger to Self:  No Self-injurious  Behavior: No Danger to Others: No Duty to Warn:no Physical Aggression / Violence:No  Access to Firearms a concern: No  Gang Involvement:No   Subjective:   Patient shares today that she called to get this earlier appt time due to "family crisis"  Monday night.  Doesn't want all details in her health record but it has impacted her family and especially patient who struggles with intense anxiety anyway.  Feeling some fear, stress, difficulty motivating herself, feeling weighted down by family situation.  Shared her emotions and did good job in talking about what is happening and what she fears may happen.  Was eventually able to stop assuming "the worst" and begin to accept that something good could possibly happen---dealing with the uncertainty is patient's biggest issue right now.  Patient encouraged to try not jump too far ahead and make assumptions that come from her anxiety and worry.    Has taken her meds past 2 nights, as discussed at last session.  Reviewed strategies to help her right now release some of her "worries" and choose to use that energy to focus on possibility of some positive outcomes in her family situation, or at least wait to see what does happen, hoping for some positive results. (CBT strategies emphasized)  Interventions: Cognitive Behavioral Therapy and Solution-Oriented/Positive Psychology  Diagnosis:   ICD-10-CM   1. Generalized anxiety disorder F41.1     Plan: Patient to follow through with strategies (especially CBT exercises) discussed in session to help with  her stress and anxiety, and to have some personal time alone which is something important to her but doesn't have as much now due to current living situation. Next session will be in 1 wk to continue goal-directed treatment.  Mathis Fareeborah Merion Grimaldo, LCSW

## 2018-11-22 ENCOUNTER — Other Ambulatory Visit: Payer: Self-pay

## 2018-11-22 ENCOUNTER — Ambulatory Visit (INDEPENDENT_AMBULATORY_CARE_PROVIDER_SITE_OTHER): Payer: 59 | Admitting: Psychiatry

## 2018-11-22 DIAGNOSIS — F411 Generalized anxiety disorder: Secondary | ICD-10-CM

## 2018-11-22 NOTE — Progress Notes (Signed)
[facility] Counselor/Therapist Progress Note  Patient ID: Vanessa CarsonGrace C Steadham, MRN: [id number],    Date: 11/22/2018  Time Spent: 60 minutes     8:00am to 9:00am  Treatment Type: Individual Therapy   Virtual Visit via  Video Note: Was able to establish video connection I connected with patient by a video enabled telemedicine application or telephone, with their informed consent, and verified patient privacy and that I am speaking with the correct person using two identifiers. [facility] am at [facility] and patient is at her home.   I discussed the limitations, risks, security and privacy concerns of performing psychotherapy and management service by telephone and the availability of in person appointments. I also discussed with the patient that there may be a patient responsible charge related to this service. The patient expressed understanding and agreed to proceed.  I discussed the treatment planning with the patient. The patient was provided an opportunity to ask questions and all were answered. The patient agreed with the plan and demonstrated an understanding of the instructions.   The patient was advised to call  our office if symptoms worsen or feel they are in a crisis state and need immediate contact.   Reported Symptoms:  Anxious, stressed dealing with uncertainties, overthinking, "family crisis that began evening of 11/15/2018"  Mental Status Exam:  Appearance:   casual    Behavior:  sharing  Motor:  Normal  Speech/Language:   Normal Rate  Affect:  anxious  Mood:  Anxious (although lessened some this past week)  Thought process:  normal  Thought content:    WNL  Sensory/Perceptual disturbances:    WNL  Orientation:  Fully oriented to person, date, place, time, situation  Attention:  Good  Concentration:  Good  Memory:  WNL  Fund of knowledge:   Good  Insight:    Good  Judgment:   Good  Impulse Control:  Good   Risk Assessment: Danger to Self:   No Self-injurious Behavior: No Danger to Others: No Duty to Warn:no Physical Aggression / Violence:No  Access to Firearms a concern: No  Gang Involvement:No   Subjective:   Patient reports this morning that her symptoms of anxiety and depression have continued, with the anxiety being stronger than depression.  "Family crisis" situation discussed last session is less volatile but still present. Patient a bit more calm about it and able to talk in more detail explaining how they have a plan of action in moving forward.  Was able to take her medication on all nights except last night when she had to complete a photo shoot at night and got back late.  Plans to continue taking her medication this coming week. Patient still expresses lots of anxiety re: school work and is difficult for her to look at other options for managing the stress, and not assume "the worst" in situations.   Tactfully confronting this with patient, hoping she can understand how this perpetuates her anxiety. Encouraged her to use her energy to focus on some "positves" (her strengths, support of family), and how CBT strategies can be helpful in re-framing in more positive ways.    Interventions: Cognitive Behavioral Therapy and Solution-Oriented/Positive Psychology  Diagnosis:   ICD-10-CM   1. Generalized anxiety disorder F41.1     Plan: Patient to follow through with strategies (especially CBT exercises) discussed in session to help with her stress and anxiety.  Goal review with patient.  To practice intentionally focusing and re;framing  in a positive direction and we can review this next session.  Encouraged overall good self-care.  Next session will be in 1-2 wks to continue goal-directed treatment.  [name], LCSW

## 2018-11-29 ENCOUNTER — Ambulatory Visit (INDEPENDENT_AMBULATORY_CARE_PROVIDER_SITE_OTHER): Payer: 59 | Admitting: Psychiatry

## 2018-11-29 ENCOUNTER — Other Ambulatory Visit: Payer: Self-pay

## 2018-11-29 DIAGNOSIS — F411 Generalized anxiety disorder: Secondary | ICD-10-CM | POA: Diagnosis not present

## 2018-11-29 NOTE — Progress Notes (Signed)
[facility] Counselor/Therapist Progress Note  Patient ID: Vanessa Doyle, MRN: [id number],    Date: 11/29/2018  Time Spent: 60 minutes     8:00am to 9:00am  Treatment Type: Individual Therapy   Virtual Visit Note: I connected with patient by a video enabled telemedicine application or telephone, with their informed consent, and verified patient privacy and that I am speaking with the correct person using two identifiers. [facility] and patient is at her home.   I discussed the limitations, risks, security and privacy concerns of performing psychotherapy and management service by telephone and the availability of in person appointments. I also discussed with the patient that there may be a patient responsible charge related to this service. The patient expressed understanding and agreed to proceed.  I discussed the treatment planning with the patient. The patient was provided an opportunity to ask questions and all were answered. The patient agreed with the plan and demonstrated an understanding of the instructions.   The patient was advised to call  our office if symptoms worsen or feel they are in a crisis state and need immediate contact.   Reported Symptoms:  Anxious, stressed dealing with uncertainties, overthinking,    Mental Status Exam:  Appearance:   casual    Behavior:  sharing  Motor:  Normal  Speech/Language:   Normal Rate  Affect:  anxious  Mood:  Anxious (although lessened some this past week)  Thought process:  normal  Thought content:    WNL  Sensory/Perceptual disturbances:    WNL  Orientation:  Fully oriented to person, date, place, time, situation  Attention:  Good  Concentration:  Good  Memory:  WNL  Fund of knowledge:   Good  Insight:    Good  Judgment:   Good  Impulse Control:  Good   Risk Assessment: Danger to Self:  No Self-injurious Behavior: No Danger to Others: No Duty to Warn:no Physical Aggression / Violence:No   Access to Firearms a concern: No  Gang Involvement:No   Subjective:   Family "crisis" discussed past couple sessions, is resolving itself gradually and patient feeling more settled down about it. Patient reports other symptoms remain present.  School continues to be a big stressor for patient. Very difficult for her to feel any suggestions may help.  Hard to focus on calming strategies.  Reports that school, even in younger years, has always been stressful and challenging for her. Worked with her to develop more positive messages to herself.  Also worked around the issue of setting boundaries and Therapist, musicestablishing priorities. Looking at things she can't control and things she cannot control.    On 1-10 scale of anxiety, she rates herself as "7", which is 1 step lower than when asked previously, so we recognized that as positive.  Also paying more attention to a healthy diet.  Has been taking meds as prescribed, and to get her refills on time.   Patient still expresses lots of anxiety re: school work and is difficult for her to look at other options for managing the stress, and not assume "the worst" in situation, however today did show a bit of effort in looking at how she can manage some of her stress more effectively.  Tactfully confronting this with patient, hoping she can understand how this perpetuates her anxiety. Encouraged her to use her energy to focus on some "positves" (her strengths, support of family), and how CBT strategies can be helpful  in re-framing in more positive ways.    Interventions: Cognitive Behavioral Therapy and Solution-Oriented/Positive Psychology  Diagnosis:   ICD-10-CM   1. Generalized anxiety disorder F41.1     Plan: Patient to follow through with strategies (especially CBT exercises) discussed in session to help her maintain some of the confidence and clarity worked on today, especially re: what is in her control and what is not, and the fact that she has some  choices in how to handle certain stressors.  Goal review with patient.  To practice intentionally focusing and re;framing in a positive direction and we can review this next session.  Encouraged overall good self-care.  Next session will be in 1-2 wks to continue goal-directed treatment.  [name], LCSW

## 2018-12-06 ENCOUNTER — Other Ambulatory Visit: Payer: Self-pay

## 2018-12-06 ENCOUNTER — Ambulatory Visit (INDEPENDENT_AMBULATORY_CARE_PROVIDER_SITE_OTHER): Payer: 59 | Admitting: Psychiatry

## 2018-12-06 DIAGNOSIS — F411 Generalized anxiety disorder: Secondary | ICD-10-CM | POA: Diagnosis not present

## 2018-12-06 NOTE — Progress Notes (Signed)
[facility] Counselor/Therapist Progress Note  Patient ID: Vanessa Doyle, MRN: [id number],    Date: 12/06/2018  Time Spent: 60 minutes     8:00am to 9:00am  Treatment Type: Individual Therapy   Virtual Visit Note: I connected with patient by a video enabled telemedicine/telehealth application or telephone, with their informed consent, and verified patient privacy and that I am speaking with the correct person using two identifiers. [facility] and patient is at her home.   I discussed the limitations, risks, security and privacy concerns of performing psychotherapy and management service by telephone and the availability of in person appointments. I also discussed with the patient that there may be a patient responsible charge related to this service. The patient expressed understanding and agreed to proceed.  I discussed the treatment planning with the patient. The patient was provided an opportunity to ask questions and all were answered. The patient agreed with the plan and demonstrated an understanding of the instructions.   The patient was advised to call  our office if symptoms worsen or feel they are in a crisis state and need immediate contact.   Reported Symptoms:  Anxious, stressed dealing with uncertainties, overthinking, some depressed mood this past week (school-related)   Mental Status Exam:  Appearance:   N/A  (telehealth)   Behavior:  sharing  Motor:  Normal  Speech/Language:   Normal Rate  Affect:  N/A  (telehealth)  Mood:  Anxious (although lessened some this past week)  Thought process:  normal  Thought content:    WNL  Sensory/Perceptual disturbances:    WNL  Orientation:  Fully oriented to person, date, place, time, situation  Attention:  Good  Concentration:  Good  Memory:  WNL  Fund of knowledge:   Good  Insight:    Good  Judgment:   Good  Impulse Control:  Good   Risk Assessment: Danger to Self:  No Self-injurious  Behavior: No Danger to Others: No Duty to Warn:no Physical Aggression / Violence:No  Access to Firearms a concern: No  Gang Involvement:No   Subjective:   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  She is in touch with professor and dept head to get some resolution.  Is going through grievance process also as are other students.  Is doing better with taking her meds and has only missed 2 times since last appt. Talks openly today which is good but has been difficult for her to work towards making additional changes re: her goals around coping with anxiety.  Part of the issue remains her lack of motivation and difficulty feeling like there's not many changes that would really help.  Worked on strategies to help her be willing to change her thinking as this seems to be blocking her from trying anything different.  Also working on developing more positive self-messages.   On 1-10 scale of anxiety, she rates herself as "9", which is 2 points higher than last session.  On 1-10 scale for depression, she rates herself as an "8" today and continues to deny SI.Marland Kitchen.  Also paying more attention to a healthy diet. Reviewed strategies and thinking patterns that can help her with the lack of motivation, anxiety, and depression, and not looking for the worst in situations.   Interventions: Cognitive Behavioral Therapy and Solution-Oriented/Positive Psychology  Diagnosis:  ICD-10-CM   1. Generalized anxiety disorder F41.1     Plan: Patient to follow through with strategies (especially CBT exercises) discussed in session to help her maintain some of the confidence and clarity worked on today, especially re: what is in her control and what is not, and the fact that she has some choices in how to better manage increased anxiety and her depressed mood.  Goal review with patient.  To  practice intentionally focusing and re;framing in a positive direction and we can review this next session.  Encouraged overall good self-care.  Next session will be in 1-2 wks to continue goal-directed treatment.  [name], LCSW

## 2018-12-15 ENCOUNTER — Ambulatory Visit (INDEPENDENT_AMBULATORY_CARE_PROVIDER_SITE_OTHER): Payer: 59 | Admitting: Psychiatry

## 2018-12-15 ENCOUNTER — Other Ambulatory Visit: Payer: Self-pay

## 2018-12-15 DIAGNOSIS — F411 Generalized anxiety disorder: Secondary | ICD-10-CM

## 2018-12-15 NOTE — Progress Notes (Signed)
[facility] Counselor/Therapist Progress Note  Patient ID: Vanessa Doyle, MRN: [id number],    Date: 12/15/2018  Time Spent: 60 minutes     8:00am to 9:00am  Treatment Type: Individual Therapy   Virtual Visit Note: I connected with patient by a video enabled telemedicine/telehealth application or telephone, with their informed consent, and verified patient privacy and that I am speaking with the correct person using two identifiers. I am at [facility] and patient is at her home.   I discussed the limitations, risks, security and privacy concerns of performing psychotherapy and management service by telephone and the availability of in person appointments. I also discussed with the patient that there may be a patient responsible charge related to this service. The patient expressed understanding and agreed to proceed.  I discussed the treatment planning with the patient. The patient was provided an opportunity to ask questions and all were answered. The patient agreed with the plan and demonstrated an understanding of the instructions.   The patient was advised to call  our office if symptoms worsen or feel they are in a crisis state and need immediate contact.   Reported Symptoms:  Anxious, stressed dealing with uncertainties, overthinking, some depressed mood this past week (school-related)   Mental Status Exam:  Appearance:   N/A  (telehealth)   Behavior:  sharing  Motor:  Normal  Speech/Language:   Normal Rate  Affect:  N/A  (telehealth)  Mood:  Anxious (although lessened some this past week)  Thought process:  normal  Thought content:    WNL  Sensory/Perceptual disturbances:    WNL  Orientation:  Fully oriented to person, date, place, time, situation  Attention:  Good  Concentration:  Good  Memory:  WNL  Fund of knowledge:   Good  Insight:    Good  Judgment:   Good  Impulse Control:  Good   Risk Assessment: Danger to Self:  No Self-injurious  Behavior: No Danger to Others: No Duty to Warn:no Physical Aggression / Violence:No  Access to Firearms a concern: No  Gang Involvement:No   Subjective:  Patient reports symptoms remaining the same as noted above.  Trying to decide where she'll live next school year, either staying at her parent's house of [place]. Still feeling stressed with school as this semester is winding down over the next week.  Very difficult for patient to try new strategies to help manage the tension and anxiety but we do keep discussing strategies and occasionally she will try something a bit different and finds it helps, although hard to hold onto it.  "I've always been a stressed person and even if I weren't in school I would still be stressed."  "Stress is my adrenaline and keeps me going."  Looked at how she views stress and how she sometimes sees stress as a benefit.    No further action on the  grievance process she and other students started.  Is not doing well with taking her meds and she says she can't always take it due to her school assignments and has to be up late at night. Talks openly today which is good but has still  been difficult for her to work towards making additional changes re: her goals around coping with anxiety.  Part of the issue remains her lack of consistent motivation .Worked again on strategies to help her be willing to change her thinking as this seems  to be blocking her from trying anything different.  Also working on developing more positive self-messages and re-frame some of her thoughts that limit changes.    On 1-10 scale of anxiety, she rates herself as "7", which is 2 points less than last session.  On 1-10 scale for depression, she rates herself as an "6" today and continues to deny SI.Marland Kitchen.  Reviewed strategies and thinking patterns that can help her with the lack of motivation, anxiety, and depression, and not looking for the worst in  situations.   Interventions: Cognitive Behavioral Therapy and Solution-Oriented/Positive Psychology  Diagnosis:   ICD-10-CM   1. Generalized anxiety disorder F41.1     Plan: Patient to follow through with strategies (especially CBT exercises) discussed in session to help her maintain some of the confidence and clarity worked on today, especially re: what is in her control and what is not, and the fact that she has some choices in how to better manage increased anxiety and her depressed mood.  Goal review with patient.  To practice intentionally focusing and re;framing in a positive direction and we can review this next session.  Encouraged overall good self-care.  Next session will be in 1-2 wks to continue goal-directed treatment.  Mathis Fareeborah Mattea Seger, LCSW

## 2018-12-22 ENCOUNTER — Other Ambulatory Visit: Payer: Self-pay

## 2018-12-22 ENCOUNTER — Ambulatory Visit (INDEPENDENT_AMBULATORY_CARE_PROVIDER_SITE_OTHER): Payer: 59 | Admitting: Psychiatry

## 2018-12-22 DIAGNOSIS — F411 Generalized anxiety disorder: Secondary | ICD-10-CM | POA: Diagnosis not present

## 2018-12-22 NOTE — Progress Notes (Signed)
[facility] Counselor/Therapist Progress Note  Patient ID: Vanessa Doyle, MRN: [id number],    Date: 12/22/2018  Time Spent: 60 minutes     8:00am to 9:00am  Treatment Type: Individual Therapy   Virtual Visit Note: I connected with patient by a video enabled telemedicine/telehealth application or telephone, with their informed consent, and verified patient privacy and that I am speaking with the correct person using two identifiers. [facility] and patient is at her home.   I discussed the limitations, risks, security and privacy concerns of performing psychotherapy and management service by telephone and the availability of in person appointments. I also discussed with the patient that there may be a patient responsible charge related to this service. The patient expressed understanding and agreed to proceed.  I discussed the treatment planning with the patient. The patient was provided an opportunity to ask questions and all were answered. The patient agreed with the plan and demonstrated an understanding of the instructions.   The patient was advised to call  our office if symptoms worsen or feel they are in a crisis state and need immediate contact.   Reported Symptoms:  Anxious, stressed dealing with uncertainties, overthinking, some depressed mood this past week (school-related)   Mental Status Exam:  Appearance:   N/A  (telehealth)   Behavior:  sharing  Motor:  Normal  Speech/Language:   Normal Rate  Affect:  N/A  (telehealth)  Mood:  Anxious   Thought process:  normal  Thought content:    WNL  Sensory/Perceptual disturbances:    WNL  Orientation:  Fully oriented to person, date, place, time, situation  Attention:  Good  Concentration:  Good  Memory:  WNL  Fund of knowledge:   Good  Insight:    Good  Judgment:   Good  Impulse Control:  Good   Risk Assessment: Danger to Self:  No Self-injurious Behavior: No Danger to Others: No Duty to  Warn:no Physical Aggression / Violence:No  Access to Firearms a concern: No  Gang Involvement:No   Subjective:  Patient today reporting the symptoms noted above and is currently relieved that this semester of school for her is over as of 2 days ago and she is now on break for 2 weeks.  Feels "weird not being in school but also a relief".  Encouraged her to use this 2 week period and stay more consistent in taking her meds and hopefully getting better sleep.  Is anticipating some issues when brother moves back into the home within a month. Suggested patient be proactive and consider reaching out to him and talking about how they can work together to make the transition as smooth as possible with both of them respecting each other's space and differences.  Still needing to make decision as to where she is going to live next year when regular school year begins again.  Not as anxious and stressed today and that does seem be be attributed to her being on break from school.  Worked more today on the overthinking, jumping too far ahead and making negative assumptions, and seeing how this works against her rather than helping her.  No further action on the  grievance process she and other students started.  On 1-10 scale of anxiety, she rates herself as "5".  On 1-10 scale for depression, she rates herself as an "5" today and continues to deny SI.Marland Kitchen.  The updated scale scores do reflect progress made.  Reviewed strategies and thinking patterns that can help her with the lack of motivation, anxiety, and depression, and not looking for the worst in situations.   Interventions: Cognitive Behavioral Therapy and Solution-Oriented/Positive Psychology  Diagnosis:   ICD-10-CM   1. Generalized anxiety disorder F41.1     Plan: Patient to follow through with strategies (especially CBT exercises) discussed in session to help her maintain some of the confidence and clarity worked on today, especially re: what is in her  control and what is not, and the fact that she has some choices in how to better manage increased anxiety and her depressed mood.  Goal review with patient.  To practice intentionally focusing and re;framing in a positive direction and we can review this next session.  Encouraged overall good self-care and be more intentional in staying on meds. Next session will be in 1-2 wks to continue goal-directed treatment.  [name], LCSW

## 2018-12-29 ENCOUNTER — Ambulatory Visit: Payer: 59 | Admitting: Psychiatry

## 2018-12-29 ENCOUNTER — Other Ambulatory Visit: Payer: Self-pay

## 2018-12-29 DIAGNOSIS — F411 Generalized anxiety disorder: Secondary | ICD-10-CM | POA: Diagnosis not present

## 2018-12-29 NOTE — Progress Notes (Signed)
[facility] Counselor/Therapist Progress Note  Patient ID: Vanessa Doyle, MRN: [id number],    Date: 12/29/2018  Time Spent: 60 minutes     8:00am to 9:00am  Treatment Type: Individual Therapy   Virtual Visit Note: I connected with patient by a video enabled telemedicine/telehealth application or telephone, with their informed consent, and verified patient privacy and that I am speaking with the correct person using two identifiers. [facility] and patient is at her home.   I discussed the limitations, risks, security and privacy concerns of performing psychotherapy and management service by telephone and the availability of in person appointments. I also discussed with the patient that there may be a patient responsible charge related to this service. The patient expressed understanding and agreed to proceed.  I discussed the treatment planning with the patient. The patient was provided an opportunity to ask questions and all were answered. The patient agreed with the plan and demonstrated an understanding of the instructions.   The patient was advised to call  our office if symptoms worsen or feel they are in a crisis state and need immediate contact.   Reported Symptoms:  Anxious, stressed dealing with uncertainties, overthinking   Mental Status Exam:  Appearance:   N/A  (telehealth)   Behavior:  sharing  Motor:  Normal  Speech/Language:   Normal Rate  Affect:  N/A  (telehealth)  Mood:  Anxious   Thought process:  normal  Thought content:    WNL  Sensory/Perceptual disturbances:    WNL  Orientation:  Fully oriented to person, date, place, time, situation  Attention:  Good  Concentration:  Good  Memory:  WNL  Fund of knowledge:   Good  Insight:    Good  Judgment:   Good  Impulse Control:  Good   Risk Assessment: Danger to Self:  No Self-injurious Behavior: No Danger to Others: No Duty to Warn:no Physical Aggression / Violence:No  Access  to Firearms a concern: No  Gang Involvement:No   Subjective:   Patient reports symptoms noted above and is mid-way through her school break.  Picks back up with summer school term starting 01/04/2019.  Does seem a little less anxious today, and says "it's hour by hour" and talks a bit more assertively about her needing to plan into the future re: school and housing concerns. Missing having her own living space and privacy. Able to "stress less" at times and then other times get "bigger waves of anxiety".  Overthinking contributes to that. Talked about strategies to help her manage the waves of anxiety, especially with her claiming more power and making more efforts to proactively confront her anxiety.  Really encouraged patient to make the effort to use strategies and not assume they won't work.     ** Encouraged her and she has followed through on using this 2 week period and stay more consistent in taking her meds and hopefully getting better sleep.  Is concerned that  some issues may arise when brother moves back into the home within a month. Again, suggested patient be proactive and consider reaching out to him and talking about how they can work together to make the transition as smooth as possible with both of them respecting each other's space and differences, plus it will be short-term until brother is able to get into his new apt.  Still needing to make decision as to where she is going to live next year when  regular school year begins again.  Worked more today on the overthinking, jumping too far ahead and making negative assumptions, and seeing how this works against her rather than helping her.  On 1-10 scale of anxiety, she rates herself as "4-5".  On 1-10 scale for depression, she rates herself as an "5" today and continues to deny SI.Marland Kitchen  The updated scale scores do reflect progress made. Reviewed strategies and thinking patterns that can help her with the lack of motivation, anxiety, and  depression, and not looking for the worst in situations.   Interventions: Cognitive Behavioral Therapy and Solution-Oriented/Positive Psychology  Diagnosis:   ICD-10-CM   1. Generalized anxiety disorder F41.1     Plan: Patient to follow through with strategies (especially CBT exercises) discussed in session to help her maintain some of the confidence and clarity worked on today, especially re: what is in her control and what is not, and the fact that she has some choices in how to better manage increased anxiety and her depressed mood.  Goal review with patient.  To practice intentionally focusing and re-framing in a positive direction and we will review this next session.  Encouraged overall good self-care and be more intentional staying on meds. Next session will be in 1-2 wks to continue goal-directed treatment.  [name], LCSW

## 2019-01-05 ENCOUNTER — Ambulatory Visit (INDEPENDENT_AMBULATORY_CARE_PROVIDER_SITE_OTHER): Payer: 59 | Admitting: Psychiatry

## 2019-01-05 ENCOUNTER — Other Ambulatory Visit: Payer: Self-pay

## 2019-01-05 DIAGNOSIS — F411 Generalized anxiety disorder: Secondary | ICD-10-CM

## 2019-01-05 NOTE — Progress Notes (Signed)
[facility] Counselor/Therapist Progress Note  Patient ID: Vanessa Doyle, MRN: [id number],    Date: 01/05/2019  Time Spent: 60 minutes     8:00am to 9:00am  Treatment Type: Individual Therapy   Virtual Visit Note: I connected with patient by a video enabled telemedicine/telehealth application or telephone, with their informed consent, and verified patient privacy and that I am speaking with the correct person using two identifiers. I am at [facility] and patient is at her home.   I discussed the limitations, risks, security and privacy concerns of performing psychotherapy and management service by telephone and the availability of in person appointments. I also discussed with the patient that there may be a patient responsible charge related to this service. The patient expressed understanding and agreed to proceed.  I discussed the treatment planning with the patient. The patient was provided an opportunity to ask questions and all were answered. The patient agreed with the plan and demonstrated an understanding of the instructions.   The patient was advised to call  our office if symptoms worsen or feel they are in a crisis state and need immediate contact.   Reported Symptoms:  Anxious, stressed dealing with uncertainties however is some better, overthinking (has improved)   Mental Status Exam:  Appearance:   N/A  (telehealth)   Behavior:  sharing  Motor:  Normal  Speech/Language:   Normal Rate  Affect:  N/A  (telehealth)  Mood:  Anxious   Thought process:  normal  Thought content:    WNL  Sensory/Perceptual disturbances:    WNL  Orientation:  Fully oriented to person, date, place, time, situation  Attention:  Good  Concentration:  Good  Memory:  WNL  Fund of knowledge:   Good  Insight:    Good  Judgment:   Good  Impulse Control:  Good   Risk Assessment: Danger to Self:  No Self-injurious Behavior: No Danger to Others: No Duty to  Warn:no Physical Aggression / Violence:No  Access to Firearms a concern: No  Gang Involvement:No   Subjective:   Patient reports symptoms noted above after just starting her summer school classes. Has been taking her medication better for the past 3 wks.  She does report better sleep and seems a little more alert and rested. Seems a little less anxious today.  Thinking ahead re: housing issues for fall semester coming up after summer school as she may return to the [employer] house she shared with 3 other students. Still missing having her own living space and privacy. Is managing some stress better as her overthinking has decreased some.  Talked about strategies to help her manage anxiety and pay attention to knowing and setting her limits. Is more accepting of suggestions that may help rather than assuming things won't help.  Starting to show some proactivity rather than always being reactive.  Encourage her in the progress I see in her and she responded well.     ** Encouraged her and she has followed through on consistency in taking her meds and getting better sleep.  Is concerned that  some issues may arise when brother moves back into the home within a month. Again, suggested patient be proactive and consider reaching out to him and talking about how they can work together to make the transition as smooth as possible with both of them respecting each other's space and differences, plus it will be short-term until brother is able to get into his  new apt. Reviewed today on the overthinking, jumping too far ahead and making negative assumptions, and seeing how this works against her rather than helping her.  On 1-10 scale of anxiety, she rates herself as "4".  On 1-10 scale for depression, she rates herself as an "4" today and continues to deny SI.Marland Kitchen.  The updated scale scores do reflect progress made. Reviewed strategies and thinking patterns that can help her with the lack of motivation, anxiety, and  depression, and not looking for the worst in situations.   Interventions: Cognitive Behavioral Therapy and Solution-Oriented/Positive Psychology  Diagnosis:   ICD-10-CM   1. Generalized anxiety disorder F41.1     Plan: Patient to follow through with strategies (especially CBT exercises) discussed in session to help her maintain some of the confidence and clarity worked on today, especially re: what is in her control and what is not, and the fact that she has some choices in how to better manage anxiety and depressed mood.  Goal review with patient.  To practice intentionally focusing and re-framing in a positive direction and we will review this next session.  Encouraged overall good self-care and be more intentional staying on meds. Next session will be in 1-2 wks to continue goal-directed treatment.  Mathis Fareeborah Ajay Strubel, LCSW

## 2019-01-12 ENCOUNTER — Other Ambulatory Visit: Payer: Self-pay

## 2019-01-12 ENCOUNTER — Ambulatory Visit: Payer: 59 | Admitting: Psychiatry

## 2019-01-12 DIAGNOSIS — F411 Generalized anxiety disorder: Secondary | ICD-10-CM

## 2019-01-12 NOTE — Progress Notes (Signed)
[facility] Counselor/Therapist Progress Note  Patient ID: Vanessa Doyle, MRN: [id number],    Date: 01/12/2019  Time Spent: 60 minutes     8:00am to 9:00am  Treatment Type: Individual Therapy   Reported Symptoms:  Anxious, stressed dealing with uncertainties however is some better, overthinking (has improved)   Mental Status Exam:  Appearance:   Neat and casual  Behavior:  sharing  Motor:  Normal  Speech/Language:   Normal Rate  Affect:  anxious  Mood:  Anxious   Thought process:  normal  Thought content:    WNL  Sensory/Perceptual disturbances:    WNL  Orientation:  Fully oriented to person, date, place, time, situation  Attention:  Good  Concentration:  Good  Memory:  WNL  Fund of knowledge:   Good  Insight:    Good  Judgment:   Good  Impulse Control:  Good   Risk Assessment: Danger to Self:  No Self-injurious Behavior: No Danger to Others: No Duty to Warn:no Physical Aggression / Violence:No  Access to Firearms a concern: No  Gang Involvement:No   Subjective:  Patient in today reporting symptoms noted above.  Not appearing as anxious and speaks more spontaneously, with a little more smiling. Demonstrates and acknowledges that she has gotten stronger in dealing with all the school changes and stressors.  Also not quite as stressed with all the coronavirus situation and does continue to follow all the precautions. Is concerned and anxious with all the racial conflict and violence.  "Feels consumed at times with it and it drains my energy."  Has taken up working with plants and that seems to be something good for her. Reports that her mom who has been furloughed may be getting called back in July.  Has still been taking her medication more consistently and has only missed1-2 nights since last appt and that has helped her sleep. Found out that she can move back into the apt house she previously lived in, which is her plan for now.   Is managing some stress better as  her overthinking has decreased some.  Talked about strategies to help her manage anxiety and pay attention to knowing and setting her limits. Is more accepting of suggestions that may help rather than assuming things won't help.  Still showing some proactivity rather than always being reactive.  Motivation is some better.  Reviewed goal, progress noted, and looked closer and areas she is still wanting to progress in, and also how to maintain progress made.  On 1-10 scale of anxiety, she rates herself as "3".  On 1-10 scale for depression, she rates herself as an "3" today and continues to deny SI.Marland Kitchen  The updated scale scores do reflect progress made. Reviewed strategies and thinking patterns that can help her with the anxiety, and depression, and not looking for the worst in situations.   Interventions: Cognitive Behavioral Therapy and Solution-Oriented/Positive Psychology  Diagnosis:   ICD-10-CM   1. Generalized anxiety disorder F41.1     Plan: Patient to follow through with strategies (especially CBT exercises) discussed in session to help her maintain some of the gains she has made and the confidence and clarity worked on today, especially re: what is in her control and what is not, and the fact that she has some choices in how to better manage anxiety and depressed mood.  Goal review with patient.  To practice intentionally focusing and re-framing in a positive direction and we will review  this next session.  Encouraged overall good self-care and be more intentional staying on meds. Next session will be in 1-2 wks to continue goal-directed treatment.  [name], LCSW

## 2019-01-19 ENCOUNTER — Other Ambulatory Visit: Payer: Self-pay

## 2019-01-19 ENCOUNTER — Ambulatory Visit: Payer: 59 | Admitting: Psychiatry

## 2019-01-19 DIAGNOSIS — F411 Generalized anxiety disorder: Secondary | ICD-10-CM | POA: Diagnosis not present

## 2019-01-19 NOTE — Progress Notes (Signed)
[facility] Counselor/Therapist Progress Note  Patient ID: Vanessa Doyle, MRN: [id number],    Date: 01/19/2019  Time Spent: 60 minutes     8:00am to 9:00am  Treatment Type: Individual Therapy   Reported Symptoms:  Anxious, stressed dealing with uncertainties however is some better, overthinking (has improved)   Mental Status Exam:  Appearance:   Neat and casual  Behavior:  sharing  Motor:  Normal  Speech/Language:   Normal Rate  Affect:  anxious  Mood:  Anxious   Thought process:  normal  Thought content:    WNL  Sensory/Perceptual disturbances:    WNL  Orientation:  Fully oriented to person, date, place, time, situation  Attention:  Good  Concentration:  Good  Memory:  WNL  Fund of knowledge:   Good  Insight:    Good  Judgment:   Good  Impulse Control:  Good   Risk Assessment: Danger to Self:  No Self-injurious Behavior: No Danger to Others: No Duty to Warn:no Physical Aggression / Violence:No  Access to Firearms a concern: No  Gang Involvement:No   Subjective:   Patient reports symptoms above and is continuing to show some progress, noticeably more that in prior sessions.  Is working with strategies we've discussed previously and having some success.  Talks with less anxiousness today and self confidence seems to be building. I do see her putting forth more effort and she also reports taking her nighttime meds on more regular schedule now. Not appearing as anxious and speaks more spontaneously, with a little more smiling. Demonstrates and acknowledges that she has gotten stronger in dealing with all the school changes and stressors "but sill have frequent times when I get anxious, it just hasn't been as bad more recently."  Sometimes "worry about going backwards" and I encouraged her to work hard to remain in the present versus going to the past or too far into the future.  Has made some progress in managing anxiety about  COVID-19.  She continues to follow all  the precautions but does not get overly anxious about it.    Is enjoying her new plants and seeing them grow.  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  Has still been taking her medication more consistently and has only missed1-2 nights since last appt and that has helped her sleep.  Is managing some stress better as her overthinking has decreased some.  Talked about strategies to help her manage anxiety and pay attention to knowing and setting her limits. Is more accepting of suggestions that may help rather than assuming things won't help. Being proactive in some cases rather than always being reactive.  Motivation gradually improving.  Reviewed goals, progress noted, and looked closer and areas she is wanting to progress in, and how to maintain progress made. Is considering after she finishes current Associates Degree in [employer], she may go for her bachelors degree.  Currently is in her 3rd week of an 8 week summer school program.   Reviewed progress and also strategies and thinking patterns that can continue to help her with the anxiety, and depression, and not looking for the worst in situations.   Interventions: Cognitive Behavioral Therapy and Solution-Oriented/Positive Psychology  Diagnosis:   ICD-10-CM   1. Generalized anxiety disorder F41.1     Plan: Patient to follow through with strategies (especially CBT exercises) discussed in session to help her maintain some of the gains  she has made and the confidence and clarity worked on today, especially re: what is in her control and what is not, and the fact that she has some choices in how to better manage anxiety and depressed mood.  Goal review with patient.  To practice intentionally focusing and re-framing in a positive direction and we will review this next session.  Encouraged overall good self-care and be more intentional staying on meds. Next session will be in 1-2 wks to continue  goal-directed treatment.  Shanon Ace, LCSW

## 2019-01-28 ENCOUNTER — Ambulatory Visit: Payer: 59 | Admitting: Psychiatry

## 2019-01-28 ENCOUNTER — Other Ambulatory Visit: Payer: Self-pay

## 2019-01-28 DIAGNOSIS — F411 Generalized anxiety disorder: Secondary | ICD-10-CM

## 2019-01-28 NOTE — Progress Notes (Signed)
[facility] Counselor/Therapist Progress Note  Patient ID: Herma CarsonGrace C Lauter, MRN: [id number],    Date: 01/28/2019  Time Spent: 60 minutes     8:00am to 9:00am  Treatment Type: Individual Therapy   Reported Symptoms:  Anxious, stressed dealing with uncertainties however is some better, overthinking (has improved)   Mental Status Exam:  Appearance:   Neat and casual  Behavior:  sharing  Motor:  Normal  Speech/Language:   Normal Rate  Affect:  anxious  Mood:  Anxious   Thought process:  normal  Thought content:    WNL  Sensory/Perceptual disturbances:    WNL  Orientation:  Fully oriented to person, date, place, time, situation  Attention:  Good  Concentration:  Good  Memory:  WNL  Fund of knowledge:   Good  Insight:    Good  Judgment:   Good  Impulse Control:  Good   Risk Assessment: Danger to Self:  No Self-injurious Behavior: No Danger to Others: No Duty to Warn:no Physical Aggression / Violence:No  Access to Firearms a concern: No  Gang Involvement:No   Subjective:   Patient in today reporting symptoms noted above. She actually began with something positive which is really different and really encouraging. School is still stressful and she seems to be managing some better as she is using strategies we discuss in sessions.  Is back living with her parents for now rather than the group house she had lived in last yr.  Appears and interacts in some less anxious ways. Self-confidence is still an issue and it is gradually improving. Is having difficulty setting boundaries with others as needed.  We explored ways to do that with which she seemed to feel more comfortable.  Has a couple situations coming up where she knows she's going to need better boundaries and be able to say "no" to requests, without feeling guilty. Continues to demonstrate and acknowledge her gradual progress. Still taking evening meds except when she is home alone overnight, such as recently when  parents were out of town.  Does sometimes "worry about going backwards" and I encouraged her to work hard to remain in the present versus going to the past or too far into the future.  She continues to follow all the COVID-19 precautions but reports she does not get overly anxious about it now.   Enjoying new plants and seeing them grow.  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  Is managing some stress better as her overthinking has decreased some.    Talked about strategies to help her manage anxiety and pay attention to knowing and setting her limits. Is more accepting of suggestions that may help rather than assuming things won't help. Being proactive in some cases rather than always being reactive.  Motivation improving.  Reviewed goals, progress noted, and looked closer and areas she is wanting to progress in, and how to maintain progress made. Is considering after she finishes current Associates Degree in [employer], she may go for her bachelors degree.  Currently is in her 3rd week of an 8 week summer school program.. States she wants to continue working on her self esteem which is currently "about a 5 out of a 10 pt scale".  *Feels that knowing her priorities and setting better limits around those is a strategy that is helping her gain more confidence, and this use of strategy is a newer behavior for her.  Hard  for her to verbalize but seems to taking some pride in herself and her efforts in working on her goals. Reviewed progress and also strategies and thinking patterns that can continue to help her with the anxiety, and depression, and not looking for the worst in situations.    Interventions: Cognitive Behavioral Therapy and Solution-Oriented/Positive Psychology  Diagnosis:   ICD-10-CM   1. Generalized anxiety disorder  F41.1     Plan: Patient to follow through with strategies (especially CBT exercises) discussed in session to help her  maintain some of the gains she has made and the confidence and clarity worked on today, especially re: what is in her control and what is not, and the fact that she has some choices in how to better manage anxiety and depressed mood.  Goal review with patient and progress noted. To practice intentionally focusing and re-framing in a positive direction and we will review this next session.  Encouraged overall good self-care and be more intentional staying on meds. Next session will be in 1-2 wks to continue goal-directed treatment.  Shanon Ace, LCSW

## 2019-02-02 ENCOUNTER — Ambulatory Visit: Payer: 59 | Admitting: Psychiatry

## 2019-02-02 ENCOUNTER — Other Ambulatory Visit: Payer: Self-pay

## 2019-02-02 DIAGNOSIS — F411 Generalized anxiety disorder: Secondary | ICD-10-CM

## 2019-02-02 NOTE — Progress Notes (Signed)
[facility] Counselor/Therapist Progress Note  Patient ID: Vanessa Doyle, MRN: [id number],    Date: 02/02/2019  Time Spent: 60 minutes     8:00am to 9:00am  Treatment Type: Individual Therapy   Reported Symptoms:  Anxious, stressed dealing with uncertainties has continued to improve, overthinking (has improved)   Mental Status Exam:  Appearance:   Neat and casual  Behavior:  sharing  Motor:  Normal  Speech/Language:   Normal Rate  Affect:  anxious  Mood:  Anxious   Thought process:  normal  Thought content:    WNL  Sensory/Perceptual disturbances:    WNL  Orientation:  Fully oriented to person, date, place, time, situation  Attention:  Good  Concentration:  Good  Memory:  WNL  Fund of knowledge:   Good  Insight:    Good  Judgment:   Good  Impulse Control:  Good   Risk Assessment: Danger to Self:  No Self-injurious Behavior: No Danger to Others: No Duty to Warn:no Physical Aggression / Violence:No  Access to Firearms a concern: No  Gang Involvement:No   Subjective:   Patient in today reporting symptoms noted above. Review of symptoms reveals some progress in managing uncertainties.  Showing more progress in working to have more positive outlook. Is also better with sticking to her meds as prescribed, especially the night time meds that help her sleep.  Reports something "positive for the week as being I got a 100 on one of my class projects."   School is still stressful and she seems to be managing some better as she is using strategies we discuss in sessions.    Appears and interacts in some less anxious ways. Self-confidence is still an issue and it is gradually improving. Is having difficulty setting boundaries with others as needed.  We explored ways to do that with which she seemed to feel more comfortable.  Has a couple situations coming up where she knows she's going to need better boundaries and be able to say "no" to requests, without feeling guilty.  Continues to demonstrate and acknowledge her gradual progress. Still taking evening meds except when she is home alone overnight, such as recently when parents were out of town.  Does sometimes "worry about going backwards" and I encouraged her to work hard to remain in the present versus going to the past or too far into the future.  She continues to follow all the COVID-19 precautions but reports she does not get overly anxious about it now.   Really taking an interest in plants and enjoying a new one she just purchased.  Has 3 now and she has named each of them and enjoys watching them grow.  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  Continues to some stress better as her overthinking has decreased some.   Reviewed strategies to help her manage anxiety and pay attention to knowing and setting her limits and appropriate boundaries with others.  Remains more accepting of suggestions that may help rather than assuming things won't help. Being proactive in some cases rather than always being reactive.  Motivation improving.  Reviewed goals, progress noted, and looked closer and areas she is wanting to progress in, and how to maintain progress made. Is considering after she finishes current Associates Degree in [employer], she may go for her bachelors degree.  Currently is in her 4th week of an 8 week summer school program.. States  she wants to continue working on her self esteem which is currently "about a 5 out of a 10 pt scale".  *Feels that knowing her priorities and setting better limits around those is a strategy that is helping her gain more confidence, and this use of strategy is a newer behavior for her.  Hard for her to verbalize but seems to taking some pride in herself and her efforts in working on her goals. Reviewed progress and also strategies and thinking patterns that can continue to help her with the anxiety, and depression, and not looking for  the worst in situations.    Interventions: Cognitive Behavioral Therapy and Solution-Oriented/Positive Psychology  Diagnosis:   ICD-10-CM   1. Generalized anxiety disorder  F41.1     Plan: Patient to follow through with strategies (especially CBT exercises) discussed in session to help her maintain some of the gains she has made and the confidence and clarity worked on today, especially re: what is in her control and what is not, and the fact that she has some choices in how to better manage anxiety and depressed mood.  Goal review with patient and progress noted. To practice intentionally focusing and re-framing in a positive direction and we will review this next session.  Encouraged overall good self-care and be more intentional staying on meds. Next session will be in 1-2 wks to continue goal-directed treatment.  Shanon Ace, LCSW

## 2019-02-09 ENCOUNTER — Ambulatory Visit: Payer: 59 | Admitting: Psychiatry

## 2019-02-09 ENCOUNTER — Other Ambulatory Visit: Payer: Self-pay

## 2019-02-09 DIAGNOSIS — F411 Generalized anxiety disorder: Secondary | ICD-10-CM | POA: Diagnosis not present

## 2019-02-09 NOTE — Progress Notes (Signed)
[facility] Counselor/Therapist Progress Note  Patient ID: Vanessa Doyle, MRN: [id number],    Date: 02/09/2019  Time Spent: 60 minutes     8:00am to 9:00am  Treatment Type: Individual Therapy   Reported Symptoms:  Anxious, stressed dealing with uncertainties has continued to improve, overthinking (has improved)   Mental Status Exam:  Appearance:   Neat and casual  Behavior:  sharing  Motor:  Normal  Speech/Language:   Normal Rate  Affect:  anxious  Mood:  Anxious   Thought process:  normal  Thought content:    WNL  Sensory/Perceptual disturbances:    WNL  Orientation:  Fully oriented to person, date, place, time, situation  Attention:  Good  Concentration:  Good  Memory:  WNL  Fund of knowledge:   Good  Insight:    Good  Judgment:   Good  Impulse Control:  Good   Risk Assessment: Danger to Self:  No Self-injurious Behavior: No Danger to Others: No Duty to Warn:no Physical Aggression / Violence:No  Access to Firearms a concern: No  Gang Involvement:No   Subjective:  Patient in today reporting symptoms listed above and reports past week  "has not been too bad", is currently on 1 week summer break. Has not been staying on her night meds "due to catching up on some work over break time."  Encouraged her to try and get back on her meds as regularly as possible as it has proven to help her sleep, which affects her outlook and stress level. 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    Appearing less anxious overall, estimated at 25% less anxious at this point and discussed this with patient.  Self-confidence is still an issue and it is gradually improving. Is having difficulty setting boundaries with others as needed and we reviewed ways to begin boundary setting that she can feel  comfortable with.  Doesn't seem to "feel guilty" as much when she talks about trying to set boundaries.  Hesitant inacknowledging progress and sometimes indicates underlying fears of abandonment.  Does sometimes "worry about going backwards" and I encouraged her to work hard to remain in the present and we also tagged it as an example of her looking for what may go wrong versus right.  She continues to follow all the COVID-19 precautions but reports she does not get overly anxious about it now.   Continued interest in plants and enjoying another new one she just purchased.  Has 4 now and she has named each of them and enjoys watching them grow.  Overthinking has decreased some. Remains more accepting of suggestions that may help rather than assuming things won't help. Being proactive in some cases rather than always being reactive.  Motivation improving.  Reviewed goals, progress noted, and looked closer and areas she is wanting to progress in, and how to maintain progress made. Is considering after she finishes current Associates Degree in [employer], she may go for her bachelors degree.  Currently is in her 5th week of an 8 week summer school program..   Updating:  States she wants to continue working on her self esteem which is currently "about a 5 out of a 10 pt scale".  *Feels that knowing her priorities and setting better limits around those  is a strategy that is helping her gain more confidence, and this use of strategy is a newer behavior for her.  Hard for her to verbalize but seems to taking some pride in herself and her efforts in working on her goals. Reviewed progress and also strategies and thinking patterns that can continue to help her with the anxiety, and depression, and not looking for the worst in situations.    Interventions: Cognitive Behavioral Therapy and Solution-Oriented/Positive Psychology  Diagnosis:   ICD-10-CM   1. Generalized anxiety disorder  F41.1     Plan:  Patient to follow through with strategies (especially CBT exercises) discussed in session to help her maintain some of the gains she has made and the confidence and clarity worked on today, especially re: what is in her control and what is not, and the fact that she has some choices in how to better manage anxiety and depressed mood.  Goal review with patient and progress noted. To practice intentionally focusing and re-framing in a positive direction and we will review this next session.  Encouraged overall good self-care and be more intentional staying on meds. Next session will be in 1-2 wks to continue goal-directed treatment.  Mathis Fareeborah Himmat Enberg, LCSW

## 2019-02-16 ENCOUNTER — Other Ambulatory Visit: Payer: Self-pay

## 2019-02-16 ENCOUNTER — Ambulatory Visit: Payer: 59 | Admitting: Psychiatry

## 2019-02-16 DIAGNOSIS — F411 Generalized anxiety disorder: Secondary | ICD-10-CM

## 2019-02-16 NOTE — Progress Notes (Signed)
[facility] Counselor/Therapist Progress Note  Patient ID: Herma CarsonGrace C Kurtzman, MRN: [id number],    Date: 02/16/2019  Time Spent: 60 minutes     8:00am to 9:00am  Treatment Type: Individual Therapy   Reported Symptoms:  Anxious, stressed dealing with uncertainties has continued to improve, overthinking (has improved)   Mental Status Exam:  Appearance:   Neat and casual  Behavior:  sharing  Motor:  Normal  Speech/Language:   Normal Rate  Affect:  anxious  Mood:  Anxious   Thought process:  normal  Thought content:    WNL  Sensory/Perceptual disturbances:    WNL  Orientation:  Fully oriented to person, date, place, time, situation  Attention:  Good  Concentration:  Good  Memory:  WNL  Fund of knowledge:   Good  Insight:    Good  Judgment:   Good  Impulse Control:  Good   Risk Assessment: Danger to Self:  No Self-injurious Behavior: No Danger to Others: No Duty to Warn:no Physical Aggression / Violence:No  Access to Firearms a concern: No  Gang Involvement:No   Subjective:   Patient in today for appt and reports symptoms above.  Some progress in overthinking less.  Not stressing quite as much over smaller things. She has started to compare herself to others and that has increased over time. Compares her school projects to [employer]'s projects and always see her projects as not being "as good".  As she describes it, the comparing is actually working against patient. Self-talk more critical.  Talked about how the negative self talk and comparing herself to others, is working against her rather than helping.  Appears more tired/sleepy today and she explains that she is out of her "night time sleep med" (temazapam)  and needs to go and get the refill.  In the meantime, she's been using the prior med Belsomra until she picks up refill on the Temazepam (Restoril).  Discussed this and patient agrees to contact pharmacy and pick up refill.. Has noticed in past she sleeps better on  this med and feels noticeably less tired.  On 1-10 scale of anxiety rates herself as "about a 5", that is progress. Self confidence is "a little better" and still working on that, paying closer attention to her self-talk and trying to be more positive. Hesitant inacknowledging progress and sometimes indicates underlying fears of abandonment.  Does sometimes "worry about going backwards" and I encouraged her to work hard to remain in the present and we also tagged it as an example of her looking for what may go wrong versus right.  She continues to follow all the COVID-19 precautions but reports she does not get overly anxious about it now. Remains more accepting of suggestions that may help rather than assuming things won't help. Being proactive in some cases rather than always being reactive.   Reviewed goals, progress noted, and looked closer and areas she is wanting to progress in, and how to maintain progress made. Is still considering after she finishes current Associates Degree in [employer], she may go for her bachelors degree.  Currently is in her 5th/6th week of an 8 week summer school program..   Patient does want to continue working on her self esteem which is currently "about a 5 out of a 10 pt scale".  *Feels that knowing her priorities and setting better limits around those is a strategy that is helping her gain more confidence, and this use of strategy is  a newer behavior for her.  Hard for her to verbalize but seems to taking some pride in herself and her efforts in working on her goals.  (this can vary at times) Reviewed progress and also strategies and thinking patterns that can continue to help her with the anxiety, and depression, and not looking for the worst in situations.    Interventions: Cognitive Behavioral Therapy and Solution-Oriented/Positive Psychology  Diagnosis:   ICD-10-CM   1. Generalized anxiety disorder  F41.1     Plan: Patient to follow through with  strategies (especially CBT exercises) discussed in session to help her maintain some of the gains she has made and the confidence and clarity worked on today, especially re: what is in her control and what is not, and the fact that she has some choices in how to better manage anxiety and depressed mood.  Goal review with patient and progress noted. To practice intentionally focusing and re-framing in a positive direction and we will review this next session.  Encouraged overall good self-care and be more intentional staying on meds. Next session will be in 1-2 wks to continue goal-directed treatment.  Shanon Ace, LCSW

## 2019-02-23 ENCOUNTER — Ambulatory Visit: Payer: 59 | Admitting: Psychiatry

## 2019-02-23 ENCOUNTER — Encounter: Payer: Self-pay | Admitting: Psychiatry

## 2019-02-23 ENCOUNTER — Other Ambulatory Visit: Payer: Self-pay

## 2019-02-23 DIAGNOSIS — F411 Generalized anxiety disorder: Secondary | ICD-10-CM

## 2019-02-23 NOTE — Progress Notes (Signed)
[facility] Counselor/Therapist Progress Note  Patient ID: Vanessa Doyle, MRN: [id number],    Date: 02/23/2019  Time Spent: 60 minutes     7:55am to 8:55am  Treatment Type: Individual Therapy   Reported Symptoms:  Anxious, stressed dealing with uncertainties has continued to improve, overthinking (has improved), tired (states her sleep is irregular due to school)   Mental Status Exam:  Appearance:   casual  Behavior:  sharing  Motor:  Normal  Speech/Language:   Normal Rate  Affect:  Anxious, tired  Mood:  Anxious   Thought process:  normal  Thought content:    WNL  Sensory/Perceptual disturbances:    WNL  Orientation:  Fully oriented to person, date, place, time, situation  Attention:  Good  Concentration:  Good  Memory:  WNL  Fund of knowledge:   Good  Insight:    Good  Judgment:   Good  Impulse Control:  Good   Risk Assessment: Danger to Self:  No Self-injurious Behavior: No Danger to Others: No Duty to Warn:no Physical Aggression / Violence:No  Access to Firearms a concern: No  Gang Involvement:No   Subjective:   Patient in today reporting being more tired which she attributes to school work. Other symptoms noted above.  Asked if she was able to follow through and take her night time meds more consistently and she said "no, due to demands of school."  Did not pick up the refill on her med as planned. Has noticed in past she sleeps better on this med and feels noticeably less tired. Appears more tired today so encouraged her again on getting the refill and using it as prescribed. Talked about this a bit and patient feels she can't change any of that right now due to school.  Some progress in overthinking less and also some decrease in comparing herself to others. Self-talk is about the same with not much progress in being less negative and critical. Talked about how the negative self talk is a real issue and comparing herself to others, is working against her  rather than helping. Again a big part of the reason seems to be "because of school and it's demands on her".  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   Followed up on homework re: reframing and has had difficulty being consistent with it, but has used it some with success.  On 1-10 scale of anxiety rates herself as "about a 5 ", same as last session.  Hesitant in acknowledging progress and sometimes indicates underlying fears of abandonment.  Does sometimes "worry about going backwards" and I encouraged her to work hard to remain in the present. She continues to follow all the COVID-19 precautions but reports she does not get overly anxious about it now. Remains more accepting of suggestions that may help rather than assuming things won't help, except when it relates to school. Being proactive in some cases rather than always being reactive.   Reviewed goals, progress noted, and looked closer and areas she is wanting to progress in, and how to maintain progress made. Is still considering after she finishes current Associates Degree in [employer], she may go for her bachelors degree.  Currently is in her 5th/6th week of an 8 week summer school program..    *Feels that knowing her priorities and setting better limits around those is a strategy that is helping her gain more confidence, and this use of  strategy is a newer behavior for her.  Hard for her to verbalize but seems to taking some pride in herself and her efforts. Reviewed progress and also strategies and thinking patterns that can continue to help her with the anxiety, and depression, and not looking for the worst in situations.    Interventions: Cognitive Behavioral Therapy and Solution-Oriented/Positive Psychology  Diagnosis:   ICD-10-CM   1. Generalized anxiety disorder  F41.1     Plan: Patient to follow through with strategies (especially CBT exercises) discussed in session to help her  maintain some of the gains she has made and the confidence and clarity worked on today, especially re: what is in her control and what is not, and the fact that she has some choices in how to better manage anxiety and depressed mood.  Goal review with patient and progress noted. To practice intentionally focusing and re-framing in a positive direction and we will review this next session.  Encouraged overall good self-care and be more intentional staying on meds. Next session will be in 1-2 wks to continue goal-directed treatment.  Shanon Ace, LCSW

## 2019-03-02 ENCOUNTER — Ambulatory Visit: Payer: 59 | Admitting: Psychiatry

## 2019-03-09 ENCOUNTER — Other Ambulatory Visit: Payer: Self-pay

## 2019-03-09 ENCOUNTER — Ambulatory Visit (INDEPENDENT_AMBULATORY_CARE_PROVIDER_SITE_OTHER): Payer: 59 | Admitting: Psychiatry

## 2019-03-09 DIAGNOSIS — F411 Generalized anxiety disorder: Secondary | ICD-10-CM | POA: Diagnosis not present

## 2019-03-09 NOTE — Progress Notes (Signed)
[facility] Counselor/Therapist Progress Note  Patient ID: Vanessa Doyle, MRN: [id number],    Date: 03/09/2019  Time Spent: 35minutes     7:55am to 9:00am  Treatment Type: Individual Therapy   Reported Symptoms:  Anxious, stressed dealing with uncertainties has continued to improve, overthinking (has improved)   Mental Status Exam:  Appearance:   casual  Behavior:  sharing  Motor:  Normal  Speech/Language:   Normal Rate  Affect:  Anxious, tired  Mood:  Anxious   Thought process:  normal  Thought content:    WNL  Sensory/Perceptual disturbances:    WNL  Orientation:  Fully oriented to person, date, place, time, situation  Attention:  Good  Concentration:  Good  Memory:  WNL  Fund of knowledge:   Good  Insight:    Good  Judgment:   Good  Impulse Control:  Good   Risk Assessment: Danger to Self:  No Self-injurious Behavior: No Danger to Others: No Duty to Warn:no Physical Aggression / Violence:No  Access to Firearms a concern: No  Gang Involvement:No   Subjective:   Patient in today and is on break from school and some less stressed, although is tending to project forward and "worry about next school semester and potential stressors."  Worked on her difficulty in staying in the present and not jumping ahead and borrowing stress, versus enjoying her break time.  Is picking up the refill on her meds this week. Has been taking her prior med to finish out which she says was fine with Dr.  Weyman Pedro progress in overthinking less and also some decrease in comparing herself to others.   Self-talk is about the same with not much progress in being less negative and critical. States she tries shutting it down but it re-emerges.  Focused on positive self-talk replacing the negative and strategies she can use more to do that.  Worked on specific examples in session.  Patient to work on not letting "excuses" get in her way as there seems to  always a reason that "blocks" new  efforts or strategies or in some cases blocks patient from trying different approach to issues that hold her back.    Homework reviewed re: continued reframing and has had difficulty being consistent with it, but has used it some with success.  On 1-10 scale of anxiety rates herself as "about a 4-5 ", (was a 5 last session).  Does sometimes "worry about going backwards" and after validating her concerns, I encouraged her to work hard to remain in the present. She continues to follow all the COVID-19 precautions and reports her anxiety over that has lessened.  Is still considering after she finishes current Associates Degree in [employer], she may go for her bachelors degree.     (Reviewed) *Feels that knowing her priorities and setting better limits around those is a strategy that is helping her gain more confidence, and this use of strategy is a newer behavior for her.  Hard for her to verbalize but seems to taking some pride in herself and her efforts. Reviewed progress and also strategies and thinking patterns that can continue to help her with the anxiety, and depression, and not looking for the worst in situations.    Interventions: Cognitive Behavioral Therapy and Solution-Oriented/Positive Psychology  Diagnosis:   ICD-10-CM   1. Generalized anxiety disorder  F41.1     Plan: Patient to continue to follow through with strategies (especially CBT exercises) discussed  in session to help her maintain some of the gains she has made and the confidence and clarity worked on today, especially re: what is in her control and what is not, and the fact that she has some choices in how to better manage anxiety and depressed mood.  Goal review with patient and progress noted. To practice intentionally focusing and re-framing in a positive direction (while staying in the present) and we will review this next session.  Encouraged overall good self-care and be more intentional staying on meds. Next  session will be in 1-2 wks to continue goal-directed treatment.  Vanessa Fareeborah Anesia Blackwell, LCSW

## 2019-03-16 ENCOUNTER — Ambulatory Visit (INDEPENDENT_AMBULATORY_CARE_PROVIDER_SITE_OTHER): Payer: 59 | Admitting: Psychiatry

## 2019-03-16 ENCOUNTER — Other Ambulatory Visit: Payer: Self-pay

## 2019-03-16 DIAGNOSIS — F411 Generalized anxiety disorder: Secondary | ICD-10-CM

## 2019-03-16 NOTE — Progress Notes (Signed)
[facility] Counselor/Therapist Progress Note  Patient ID: Herma CarsonGrace C Gater, MRN: [id number],    Date: 03/16/2019  Time Spent: 60 minutes     8:00am to 9:00am  Treatment Type: Individual Therapy   Reported Symptoms:  Anxious, stressed dealing with uncertainties has continued to improve, overthinking (has improved)   Mental Status Exam:  Appearance:   casual  Behavior:  sharing  Motor:  Normal  Speech/Language:   Normal Rate  Affect:  Anxious, tired  Mood:  Anxious   Thought process:  normal  Thought content:    WNL  Sensory/Perceptual disturbances:    WNL  Orientation:  Fully oriented to person, date, place, time, situation  Attention:  Good  Concentration:  Good  Memory:  WNL  Fund of knowledge:   Good  Insight:    Good  Judgment:   Good  Impulse Control:  Good   Risk Assessment: Danger to Self:  No Self-injurious Behavior: No Danger to Others: No Duty to Warn:no Physical Aggression / Violence:No  Access to Firearms a concern: No  Gang Involvement:No   Subjective:   Patient in today and is still on break from school and is feeling some less stressed, although is tending to project forward and "worry about next school semester and potential stressors."  Again worked on her difficulty staying in the present and not jumping ahead and borrowing stress, versus enjoying her break time.  Did not pick up the refill on her meds this past week but is making herself a reminder note for this week.  More awareness of her overthinking and trying to lessen it and lessen her comparing herself to others. Has taken her evening meds more regularly except for 2-3 nights in past week.  Self-talk is about the same with not much progress in being less negative and critical. States she tries shutting it down but it re-emerges.  Focused on positive self-talk replacing the negative and strategies she can use more to do that.  Worked on specific examples in session.  Patient to work on  not letting "excuses" get in her way as there seems to  always a reason that "blocks" new efforts or strategies or in some cases blocks patient from trying different approach to issues that hold her back. Talked about this more today and patient seems a bit more motivated.  Is wanting to apply to colleges to complete a bachelor's degree after finishing her current associates ([employer], [employer] and maybe [employer]).    On 1-10 scale of anxiety rates herself as "about a 4 ", (was a 4-5 last session). She continues to follow all the COVID-19 precautions and reports her anxiety over that has lessened.     (Reviewed) *Feels that knowing her priorities and setting better limits around those is a strategy that is helping her gain more confidence, and this use of strategy is a newer behavior for her.  Hard for her to verbalize but seems to gradually be taking some pride in herself and her efforts. Reviewed progress and also strategies and thinking patterns that can continue to help her with the anxiety, overthinking, looking for what may go wrong,  and depression.    Interventions: Cognitive Behavioral Therapy and Solution-Oriented/Positive Psychology  Diagnosis:   ICD-10-CM   1. Generalized anxiety disorder  F41.1     Plan: Patient to continue to follow through with strategies (especially CBT exercises) discussed in session to help her maintain some of the gains she  has made and the confidence and clarity worked on today, especially re: what is in her control and what is not, and reinforced that she has some choices in how to better manage anxiety and depressed mood.  Goal review with patient and progress noted. To continue to interrupt negative thoughts and practice intentionally re-framing in a positive direction (while staying in the present) and we will continue to check in on this each session.  Encouraged overall good self-care and be more intentional staying on meds. Next session will be in 1-2 wks to continue  goal-directed treatment.  Shanon Ace, LCSW

## 2019-03-23 ENCOUNTER — Ambulatory Visit (INDEPENDENT_AMBULATORY_CARE_PROVIDER_SITE_OTHER): Payer: 59 | Admitting: Psychiatry

## 2019-03-23 ENCOUNTER — Other Ambulatory Visit: Payer: Self-pay

## 2019-03-23 DIAGNOSIS — F411 Generalized anxiety disorder: Secondary | ICD-10-CM | POA: Diagnosis not present

## 2019-03-23 NOTE — Progress Notes (Signed)
[facility] Counselor/Therapist Progress Note  Patient ID: Vanessa Doyle, MRN: [id number],    Date: 03/23/2019  Time Spent: 60 minutes     8:00am to 9:00am  Treatment Type: Individual Therapy   Reported Symptoms:  Anxious, stressed dealing with uncertainties has continued to improve, overthinking (has improved)   Mental Status Exam:  Appearance:   casual  Behavior:  sharing  Motor:  Normal  Speech/Language:   Normal Rate  Affect:  Anxious, tired  Mood:  Anxious   Thought process:  normal  Thought content:    WNL  Sensory/Perceptual disturbances:    WNL  Orientation:  Fully oriented to person, date, place, time, situation  Attention:  Good  Concentration:  Good  Memory:  WNL  Fund of knowledge:   Good  Insight:    Good  Judgment:   Good  Impulse Control:  Good   Risk Assessment: Danger to Self:  No Self-injurious Behavior: No Danger to Others: No Duty to Warn:no Physical Aggression / Violence:No  Access to Firearms a concern: No  Gang Involvement:No   Subjective:   Patient in today and is still on break from school and is to return to school classes next week. Has done better on staying on night-time meds however not picked up the update Rx but says she will definitely do it this week and put another reminder on her phone while here in session.    In following up on her homework, patient feels that she is doing some better "staying in the present" versus always jumping ahead which has tended to add to her stress. More awareness of her overthinking and trying to lessen it and lessen her comparing herself to others. Has taken her evening meds more regularly except for 2-3 nights in past week.  Continued work on making self-talk more positive.  Some progress in being less negative and critical. States she tries shutting it down but it re-emerges.  Focused on positive self-talk replacing the negative and strategies she can use more to do that.  Worked again on  specific examples in session.  Patient to work more on not letting "excuses" get in her way as there seems to  always a reason that "blocks" new efforts or strategies or in some cases blocks patient from trying different approach to issues that hold her back.  On 1-10 scale of anxiety rates herself as "about a 3 ", (was a 4 last session). She continues to follow all the COVID-19 precautions and reports her anxiety over that has lessened.    Gradually gaining some self-confidence.  Hard for her to verbalize but seems to gradually be taking some pride in herself and her efforts. Reviewed progress and also strategies and thinking patterns that can continue to help her with the anxiety, self-calming, overthinking, looking for what may go wrong, negative self-talk, and depression.   Interventions: Cognitive Behavioral Therapy and Solution-Oriented/Positive Psychology  Diagnosis:   ICD-10-CM   1. Generalized anxiety disorder  F41.1     Plan: Patient to continue to follow through with strategies (especially CBT exercises) discussed in session to help her maintain some of the gains she has made and the confidence and clarity worked on today, especially re: what is in her control and what is not,depressed mood.  Goal review with patient and progress noted. To continue to interrupt negative thoughts and practice intentionally re-framing in a positive direction (while staying in the present) and we will continue  to check in on this each session.  Encouraged overall good self-care and be more intentional staying on meds. Next session will be in 1-2 wks to continue goal-directed treatment.  Mathis Fareeborah Rolonda Pontarelli, LCSW

## 2019-03-30 ENCOUNTER — Ambulatory Visit: Payer: 59 | Admitting: Psychiatry

## 2019-03-31 ENCOUNTER — Other Ambulatory Visit: Payer: Self-pay

## 2019-03-31 ENCOUNTER — Ambulatory Visit (INDEPENDENT_AMBULATORY_CARE_PROVIDER_SITE_OTHER): Payer: 59 | Admitting: Psychiatry

## 2019-03-31 DIAGNOSIS — F411 Generalized anxiety disorder: Secondary | ICD-10-CM | POA: Diagnosis not present

## 2019-03-31 NOTE — Progress Notes (Signed)
[facility] Counselor/Therapist Progress Note  Patient ID: Vanessa Doyle, MRN: [id number],    Date: 03/31/2019  Time Spent: 60 minutes     9:00am to 10:00am  Treatment Type: Individual Therapy   Reported Symptoms:  Anxious, stressed dealing with uncertainties has continued to improve, overthinking (has improved)   Mental Status Exam:  Appearance:   casual  Behavior:  sharing  Motor:  Normal  Speech/Language:   Normal Rate  Affect:  Anxious,   Mood:  Anxious   Thought process:  normal  Thought content:    WNL  Sensory/Perceptual disturbances:    WNL  Orientation:  Fully oriented to person, date, place, time, situation  Attention:  Good  Concentration:  Good  Memory:  WNL  Fund of knowledge:   Good  Insight:    Good  Judgment:   Good  Impulse Control:  Good   Risk Assessment: Danger to Self:  No Self-injurious Behavior: No Danger to Others: No Duty to Warn:no Physical Aggression / Violence:No  Access to Firearms a concern: No  Gang Involvement:No   Subjective:   Patient in today reporting symptoms above. For the first time in seeing patient she started the session off responding to the question of "How are you?" by saying "Good".  This is encouraging for patient even as she is still work on her need areas of managing uncertainties, stressors, anxieties, and in decreasing her negative thoughts and overthinking. Has still not picked up the update Rx (for night meds.) but says she will definitely do it this week and put another reminder on her phone while here in session. Reminded of this at end of session today.  In following up on her homework, patient seems to be more vigilant in some of the strategies we've discussed to manage her symptoms of self-negating, assuming the negative in situation, reverting back to the past, and overthinking.  Is not making large gains yet but does seem to be more consistent in her efforts per patient report. Currently living with  parents and wants to move into her own apt, and is discussing that with parents who cover her expenses while she's in college.  Her self-awareness is still improving which seems to he helping her make progress towards her goals and symptom decrease.  Not much progress in being less negative and critical, however still working on it. On 1-10 scale of anxiety, patient rates herself as "about a 3 ", which is same as last session.. She continues to follow all the COVID-19 precautions and reports her anxiety over that has lessened some.    Gradually gaining some self-confidence.  Hard for her to verbalize but seems to very gradually be taking some pride in herself and her efforts. Reviewed progress and also strategies and thinking patterns that can continue to help her with the anxiety, self-calming, overthinking, looking for what may go wrong, negative self-talk, and depression. Denies any SI.   Interventions: Cognitive Behavioral Therapy and Solution-Oriented/Positive Psychology  Diagnosis:   ICD-10-CM   1. Generalized anxiety disorder  F41.1     Plan: Patient to continue to follow through with strategies (especially CBT exercises) discussed in session to help her maintain some of the gains she has made and the confidence and clarity worked on today, especially re: what is in her control and what is not,depressed mood.  Goal review with patient and progress noted. To continue to interrupt negative thoughts and practice intentionally re-framing in a positive direction (while staying  in the present) and we will continue to check in on this each session.  Encouraged overall good self-care and be more intentional staying on meds. Next session will be in 1-2 wks to continue goal-directed treatment.  Vanessa Ace, LCSW

## 2019-04-05 ENCOUNTER — Other Ambulatory Visit: Payer: Self-pay | Admitting: Psychiatry

## 2019-04-05 DIAGNOSIS — F411 Generalized anxiety disorder: Secondary | ICD-10-CM

## 2019-04-05 DIAGNOSIS — F5105 Insomnia due to other mental disorder: Secondary | ICD-10-CM

## 2019-04-05 NOTE — Telephone Encounter (Signed)
Last appt 07/2018

## 2019-04-05 NOTE — Telephone Encounter (Signed)
Refill is not possible for temazepam 7.5 mg as she is 8 months since last appointment 5 months overdue for recommended appointment and 2 months beyond mandated 76-month appointments for controlled substance,only able to provide emergency supply of #15 until seen here or elsewhere for appointment.

## 2019-04-06 ENCOUNTER — Ambulatory Visit: Payer: 59 | Admitting: Psychiatry

## 2019-04-07 ENCOUNTER — Other Ambulatory Visit: Payer: Self-pay

## 2019-04-07 ENCOUNTER — Ambulatory Visit (INDEPENDENT_AMBULATORY_CARE_PROVIDER_SITE_OTHER): Payer: 59 | Admitting: Psychiatry

## 2019-04-07 DIAGNOSIS — F411 Generalized anxiety disorder: Secondary | ICD-10-CM

## 2019-04-07 NOTE — Progress Notes (Signed)
[facility] Counselor/Therapist Progress Note  Patient ID: Vanessa CarsonGrace C Doyle, MRN: [id number],    Date: 04/07/2019  Time Spent: 60 minutes     9:00am to 10:00am  Treatment Type: Individual Therapy   Reported Symptoms:  Anxious ("improving some"),  overthinking (has improved)   Mental Status Exam:  Appearance:   casual  Behavior:  sharing  Motor:  Normal  Speech/Language:   Normal Rate  Affect:  Anxious,   Mood:  Anxious (some progress)  Thought process:  normal  Thought content:    WNL  Sensory/Perceptual disturbances:    WNL  Orientation:  Fully oriented to person, date, place, time, situation  Attention:  Good  Concentration:  Good  Memory:  WNL  Fund of knowledge:   Good  Insight:    Good  Judgment:   Good  Impulse Control:  Good   Risk Assessment: Danger to Self:  No Self-injurious Behavior: No Danger to Others: No Duty to Warn:no Physical Aggression / Violence:No  Access to Firearms a concern: No  Gang Involvement:No   Subjective:   Patient in today reporting symptoms above. For the second time in seeing patient she started the session off responding to the question of "How are you?" by saying "Good".  Encouraging for patient as she still works on her need areas of managing uncertainties, stressors, anxieties, and in decreasing her negative thoughts and overthinking. Seems to have some "peace" with some things that she hadn't experienced before.  Did not pick up Rx  evening medication as planned but has contacted her pharmacy and learned it had expired so they called our office and got it updated so she is to pick it up today.   Her classes are back in session and due to her major, most classes are in person, with space to do her homework projects.  Is less impacted by uncertainties which is a good step for her.  Feels she is learning to navigate uncertainty better and some initial stress management is being more effective.  Not having her own "personal space"  while living with her parents is a stressor that she is working to manage better until she is able to have her own apartment.   Demonstrates in session the ability to better manage anxiety and states she has had some success in using strategies worked on previously including slow deep breathing exercises and changing her thoughts, which also helps her in other negative habits of overthinking, going back to the past in her mind, and assuming the worst in situations.Patient's progress has been more noticeable past couple sessions and she realizes she is early in her progress, and she shares that it feels good, but some difficulty in trusting it as it feel different.  Reviewed progress and also strategies and thinking patterns that can continue to help her with the anxiety, self-calming, overthinking, looking for what may go wrong, and negative self-talk. Denies any SI.   Interventions: Cognitive Behavioral Therapy and Solution-Oriented/Positive Psychology  Diagnosis:   ICD-10-CM   1. Generalized anxiety disorder  F41.1     Plan: Patient to continue to follow through with strategies (especially CBT exercises) discussed in session to help her maintain some of the gains she has made and the confidence and clarity worked on today, especially re: what is in her control and what is not.   Goal review with patient and progress noted. To continue to interrupt negative thoughts and practice intentionally re-framing in a positive direction (while staying  in the present) and we will continue to check in on this each session.  Encouraged overall good self-care and be more intentional staying on meds. Next session will be in 1-2 wks to continue goal-directed treatment.  Shanon Ace, LCSW

## 2019-04-21 ENCOUNTER — Other Ambulatory Visit: Payer: Self-pay

## 2019-04-21 ENCOUNTER — Ambulatory Visit (INDEPENDENT_AMBULATORY_CARE_PROVIDER_SITE_OTHER): Payer: 59 | Admitting: Psychiatry

## 2019-04-21 DIAGNOSIS — F411 Generalized anxiety disorder: Secondary | ICD-10-CM

## 2019-04-21 NOTE — Progress Notes (Signed)
          [facility] Counselor/TherapistProgress Note  Patient ID: Vanessa Doyle, MRN: [id number],    Date: 04/21/2019  Time Spent: 60 minutes    8:00am to 9:00am   Treatment Type: Individual Therapy  Reported Symptoms:   Anxiety, stressed (mostly related to college-related), frustrated and feels some depression at times (more related to college)  Mental Status Exam:  Appearance:   Casual     Behavior:  Appropriate and Sharing  Motor:  Normal  Speech/Language:   Clear and Coherent  Affect:  anxious, tired  Mood:  anxious and frustrated  Thought process:  normal  Thought content:    WNL  Sensory/Perceptual disturbances:    WNL  Orientation:  oriented to person, place, time/date, situation, day of week, month of year and year  Attention:  Good  Concentration:  Good  Memory:  WNL  Fund of knowledge:   Good  Insight:    Good  Judgment:   Good  Impulse Control:  Good   Risk Assessment: Danger to Self:  No Self-injurious Behavior: No Danger to Others: No Duty to Warn:no Physical Aggression / Violence:No  Access to Firearms a concern: No  Gang Involvement:No   Subjective: Patient in today reporting anxiety, increased stress with college classes being more of hybrid model, and frustration and some depression due to lots of uncertainties with college and finding out how behind her program is due to pandemic. Sleep is ok. Does feel that she tries hard to manage the anxiety, frustrations, and depression as well as possible.  Interventions: Cognitive Behavioral Therapy and Solution-Oriented/Positive Psychology  Diagnosis:   ICD-10-CM   1. Generalized anxiety disorder  F41.1     Plan:   Treatment Goals: Patient not signing  any updates for tx plan on computer screen due to [place]. Long term goal: Stabilize anxiety level while increasing ability to function on a daily basis.  (Progressing)  Short term goal: Increase understanding of beliefs and messages that produce  worry and amxiety.  Strategies: 1)Help patient develop reality-based, positive cognitive messages. 2)Report a decreased level of daily anxiety due to use of positive self-talk.  (Progressing)- patient motivated and helped select updated goals/strategies today.  Is still showing gradual progress in more recent weeks.    Return appt within 2 wks  Shanon Ace, LCSW

## 2019-04-29 ENCOUNTER — Other Ambulatory Visit: Payer: Self-pay

## 2019-04-29 ENCOUNTER — Ambulatory Visit (INDEPENDENT_AMBULATORY_CARE_PROVIDER_SITE_OTHER): Payer: 59 | Admitting: Psychiatry

## 2019-04-29 DIAGNOSIS — F411 Generalized anxiety disorder: Secondary | ICD-10-CM | POA: Diagnosis not present

## 2019-04-29 NOTE — Progress Notes (Signed)
      [facility] Counselor/Therapist Progress Note  Patient ID: Vanessa Doyle, MRN: [id number],    Date: 04/29/2019  Time Spent: 60 minutes   8:00am to 9:00am  Treatment Type: Individual Therapy  Reported Symptoms: anxiety, depression (mild), stressed, tiredness ("couldn't take my night meds a couple nights")  Mental Status Exam:  Appearance:   Casual     Behavior:  Appropriate and Sharing  Motor:  Normal  Speech/Language:   Normal Rate  Affect:  anxious, depressed (mild)  Mood:  anxious and depressed  Thought process:  normal  Thought content:    WNL  Sensory/Perceptual disturbances:    WNL  Orientation:  oriented to person, place, time/date, situation, day of week, month of year and year  Attention:  Good  Concentration:  Good  Memory:  WNL  Fund of knowledge:   Good  Insight:    Good  Judgment:   Good  Impulse Control:  Good   Risk Assessment: Danger to Self:  No Self-injurious Behavior: No Danger to Others: No Duty to Warn:no Physical Aggression / Violence:No  Access to Firearms a concern: No  Gang Involvement:No   Subjective:  Patient in today reporting anxiety, feeling stressed (school, home)  and depression has decreased.  Specific frustrations with school and home discussed as well as looking and how she can advocate for herself in the school situations.  Interventions: Cognitive Behavioral Therapy and Solution-Oriented/Positive Psychology  Diagnosis:   ICD-10-CM   1. Generalized anxiety disorder  F41.1     Plan:  Some goals may remain on tx plan as patient works on them, however progress will be noted each session in "Progress" sections.  Treatment Goals: Patient not signing  any updates for tx plan on computer screen due to [place]. Long term goal: Stabilize anxiety level while increasing ability to function on a daily basis.   Short term goal: Increase understanding of beliefs and messages that produce worry and amxiety.  Strategies: 1)Help  patient develop reality-based, positive cognitive messages. 2)Report a decreased level of daily anxiety due to use of positive self-talk.  Progressing: Worked today on self-talk and looking at her tendency to be far more negative versus positive and what supports her negativity.  Working to be more positive in her self-talk as that definitely contributes to her anxiety.  Describes her negative self-talk as almost automatic without thinking about it much, and hard for her to think of but she knows she does have frequent negative thoughts, just didn't connect how they might affect her anxiety level. She is to monitor more closely her negative self-talk and be able follow up next session on this. Goal review and progress noted with patient.   Next appt in 1-2 weeks.   Shanon Ace, LCSW

## 2019-05-05 ENCOUNTER — Other Ambulatory Visit: Payer: Self-pay

## 2019-05-05 ENCOUNTER — Ambulatory Visit (INDEPENDENT_AMBULATORY_CARE_PROVIDER_SITE_OTHER): Payer: 59 | Admitting: Psychiatry

## 2019-05-05 DIAGNOSIS — F411 Generalized anxiety disorder: Secondary | ICD-10-CM | POA: Diagnosis not present

## 2019-05-05 NOTE — Progress Notes (Signed)
      [facility] Counselor/Therapist Progress Note  Patient ID: Vanessa Doyle, MRN: [id number],    Date: 05/05/2019  Time Spent: 60 minutes    8:00am to 9:00am  Treatment Type: Individual Therapy  Reported Symptoms:  Anxiety, stressed, some depression, some irritability  Mental Status Exam:  Appearance:   Casual     Behavior:  Appropriate and Sharing  Motor:  Normal  Speech/Language:   Normal Rate  Affect:  anxious  Mood:  anxious  Thought process:  normal  Thought content:    WNL  Sensory/Perceptual disturbances:    WNL  Orientation:  oriented to person, place, time/date, situation, day of week, month of year and year  Attention:  Good  Concentration:  Good  Memory:  WNL  Fund of knowledge:   Good  Insight:    Good  Judgment:   Good  Impulse Control:  Good   Risk Assessment: Danger to Self:  No Self-injurious Behavior: No Danger to Others: No Duty to Warn:no Physical Aggression / Violence:No  Access to Firearms a concern: No  Gang Involvement:No   Subjective:  Patient in today with ongoing stress from school and finds it very difficult to follow through on stress management strategies. Is not understanding some things in her classes and she was encouraged to ask the instructor for clarification. Also trying to use some time-management strategies that we've talked about in sessions. 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  Interventions: Cognitive Behavioral Therapy  Diagnosis:   ICD-10-CM   1. Generalized anxiety disorder  F41.1      Plan:  Patient not signing any updates for tx plan on computer screen due to [place].  Treatment Goals: Some goals may remain on tx plan as patient works on them, however progress will be noted each session in "Progress" sections.  Long term goal: Stabilize anxiety level while increasing ability to function on a daily basis.   Short  term goal: Increase understanding of beliefs and messages that produce worry and amxiety.   Strategies: 1)Help patient develop reality-based, positive cognitive messages. 2)Report a decreased level of daily anxiety due to use of positive self-talk.  Progressing: Processing some of her self-talk and how it has remained rather negative.  Has not yet tried some of the techniques we've discussed in sessions, and commits to doing it this week. Is  to work on interrupting negative self-talk and negative thoughts.  Also not stayed in her regular medication schedule "because I had to stay up late some because of homework." Encouraged her to make efforts to stick with her meds as they have proven to help. Worked more on time-management in regards to assignments so that her medication doesn't get left out.  To put reminder to monitor self-talk in her bullet journal. Will review next session.   Goal review and progress noted with patient.  Next session 1-2 weeks out.   Shanon Ace, LCSW

## 2019-05-13 ENCOUNTER — Ambulatory Visit (INDEPENDENT_AMBULATORY_CARE_PROVIDER_SITE_OTHER): Payer: 59 | Admitting: Psychiatry

## 2019-05-13 ENCOUNTER — Other Ambulatory Visit: Payer: Self-pay

## 2019-05-13 DIAGNOSIS — F411 Generalized anxiety disorder: Secondary | ICD-10-CM

## 2019-05-13 NOTE — Progress Notes (Signed)
[facility] Counselor/Therapist Progress Note  Patient ID: LILLIAH PRIEGO, MRN: [id number],    Date: 05/13/2019  Time Spent: 60 minutes   8:00am to 9:00am   Treatment Type: Individual Therapy  Reported Symptoms:  Anxiety, stressed, depression (better)  Mental Status Exam:  Appearance:   Casual     Behavior:  Appropriate and Sharing  Motor:  Normal  Speech/Language:   Normal Rate  Affect:  anxious  Mood:  anxious  Thought process:  goal directed  Thought content:    WNL  Sensory/Perceptual disturbances:    WNL  Orientation:  oriented to person, place, time/date, situation, day of week, month of year and year  Attention:  Good  Concentration:  Good  Memory:  WNL  Fund of knowledge:   Good  Insight:    Good  Judgment:   Good  Impulse Control:  Good   Risk Assessment: Danger to Self:  No Self-injurious Behavior: No Danger to Others: No Duty to Warn:no Physical Aggression / Violence:No  Access to Firearms a concern: No  Gang Involvement:No   Subjective:  Patient in today reporting she has been "up and down", "not extreme changes" but trying to deal with the stresses of college "which is more unpredictable and frequently changing with in-class and online schooling. Knows some good strategies for managing stress but shares that she "loses it" when in the midst of stressful situations, "I feel like my brain shuts down."  States it is worse when the noise level is so loud as it is in some of her school situations.  Still having difficulty taking her night meds, as she explains that she can't take it every night depending on how late she needs to stay up doing assignments.  If she has to be up late, then she doesn't take the meds so she won't be drowsy.Discussed how having better sleep might help her with getting her work done more efficiently and not be as tired.  Would need to take the med earlier due to drowsiness. "And I have a fear of not waking up on time."      Interventions: Cognitive Behavioral Therapy and Ego-Supportive  Diagnosis:   ICD-10-CM   1. Generalized anxiety disorder  F41.1     Plan: Patient not signing any updates for tx plan on computer screen due to COVID.  Treatment Goals: Some goals may remain on tx plan as patient works on them, however progress will be noted each session in "Progress" sections.  Long term goal: Stabilize anxiety level while increasing ability to function on a daily basis.   Short term goal: Increase understanding of beliefs and messages that produce worry and amxiety.   Strategies: 1)Help patient develop reality-based, positive cognitive messages. 2)Report a decreased level of daily anxiety due to use of positive self-talk.  Progressing: Discussed today some of patient's fears that we were able to tag as "beliefs/messages" that lead to anxiety, such as fear of "being late for anything, always fear that I'm going to say something wrong, difficulty letting go in order to move forward, fears what can go wrong in situations, I doubt myself at times."  Took the time today to look at each of these and challenged them, and questioned their accuracy in the way she experiences them.  Self-talk, she says, is some better. Working more to Stop Comparing Self to Others due to the negativity that results. Encouraged effective time management to include regular taking of meds, especially night meds that  help her sleep. Did not follow through on assignment for self-talk but is doing it starting today.  Just added it to her "bullet journal" on her phone so she will see it as a reminder several times a day. Will share and review next session.  Goal review and progress noted today with patient.  Next appt in 1-2 wks.   Shanon Ace, LCSW

## 2019-05-20 ENCOUNTER — Ambulatory Visit: Payer: 59 | Admitting: Psychiatry

## 2019-05-27 ENCOUNTER — Ambulatory Visit (INDEPENDENT_AMBULATORY_CARE_PROVIDER_SITE_OTHER): Payer: 59 | Admitting: Psychiatry

## 2019-05-27 ENCOUNTER — Other Ambulatory Visit: Payer: Self-pay

## 2019-05-27 DIAGNOSIS — F411 Generalized anxiety disorder: Secondary | ICD-10-CM

## 2019-05-27 NOTE — Progress Notes (Signed)
[facility] Counselor/Therapist Progress Note  Patient ID: Vanessa Doyle, MRN: [id number],    Date: 05/27/2019  Time Spent: 60 minutes   8:00am to 9:00am  Treatment Type: Individual Therapy  Reported Symptoms: anxiety, some mild depression  Mental Status Exam:  Appearance:   Casual     Behavior:  Appropriate and Sharing  Motor:  Normal  Speech/Language:   Normal Rate  Affect:  anxious  Mood:  anxious and some depression  Thought process:  goal directed  Thought content:    WNL  Sensory/Perceptual disturbances:    WNL  Orientation:  oriented to person, place, time/date, situation, day of week, month of year and year  Attention:  Good  Concentration:  Good  Memory:  WNL  Fund of knowledge:   Good  Insight:    Good  Judgment:   Good  Impulse Control:  Good   Risk Assessment: Danger to Self:  No Self-injurious Behavior: No Danger to Others: No Duty to Warn:no Physical Aggression / Violence:No  Access to Firearms a concern: No  Gang Involvement:No   Subjective:  Patient in today reporting a very stressful past week, mostly from school situations and assignments.  Frustrated, angry, and some spikes in her anxiety "but some better now."  Interventions: Cognitive Behavioral Therapy  Diagnosis:   ICD-10-CM   1. Generalized anxiety disorder  F41.1      Plan: Patient not signing any updates for tx plan on computer screen due to [place].  Treatment Goals: Some goals may remain on tx plan as patient works on them, however progress will be noted each session in "Progress" sections.  Long term goal: Stabilize anxiety level while increasing ability to function on a daily basis.   Short term goal: Increase understanding of beliefs and messages that produce worry and amxiety.   Strategies: 1)Help patient develop reality-based, positive cognitive messages. 2)Report a decreased level of daily anxiety due to use of positive  self-talk.  Progressing: Discussed goals again today with patient and they still feel on target for her.  Worked on long and short term goals today, especially anxiety stabilization and better understanding how some of her beliefs and self-talk lead to anxiety. Looked at "what helps and doesn't help" in trying to stabilize anxiety.  "What Helps" is taking breaks occasionally, enjoying her plants (has about 16 plants which she has named), "getting outside more and moving about". "What's not helping" includes hanging on to some negative self-talk, looking for what might go wrong, low motivation at times for making changes, self-care with her sleep patterns and taking meds to help that (sometimes does not take the meds "because I have too much to do").  Patient has has difficulty sticking to strategies at times and we talked about motivation, follow through, and retaining gains.  Reviewed some of her fears she expressed last session:  Fear of being late, fear of saying something wrong, fears what can go wrong in situations, and doubting herself.  Time running out in session but did name these again with input from patient and will follow up more in future session. Is also working to decrease her habit of comparing herself to others which has led to negativity and anxiousness for patient. Encouraged again to continue taking night meds as prescribed, and is also documented in her "bullet journal along with other goal-directed actions/thought patterns. Goal review and progress noted with patient.   Next appt within 1-2 weeks.   Shanon Ace, LCSW

## 2019-06-03 ENCOUNTER — Other Ambulatory Visit: Payer: Self-pay

## 2019-06-03 ENCOUNTER — Ambulatory Visit (INDEPENDENT_AMBULATORY_CARE_PROVIDER_SITE_OTHER): Payer: 59 | Admitting: Psychiatry

## 2019-06-03 DIAGNOSIS — F411 Generalized anxiety disorder: Secondary | ICD-10-CM

## 2019-06-03 NOTE — Progress Notes (Signed)
[facility] Counselor/Therapist Progress Note  Patient ID: Vanessa Doyle, MRN: [id number],    Date: 06/03/2019  Time Spent: 60 minutes    8:00am to 9:00am   Treatment Type: Individual Therapy  Reported Symptoms: anxiety, stressed, some depression  Mental Status Exam:  Appearance:   Casual     Behavior:  Appropriate and Sharing  Motor:  Normal  Speech/Language:   Normal Rate  Affect:  anxious  Mood:  anxious and depressed  Thought process:  goal directed  Thought content:    WNL  Sensory/Perceptual disturbances:    WNL  Orientation:  oriented to person, place, time/date, situation, day of week, month of year and year  Attention:  Good  Concentration:  Good  Memory:  WNL  Fund of knowledge:   Good  Insight:    Good  Judgment:   Good  Impulse Control:  Good   Risk Assessment: Danger to Self:  No Self-injurious Behavior: No Danger to Others: No Duty to Warn:no Physical Aggression / Violence:No  Access to Firearms a concern: No  Gang Involvement:No   Subjective: Patient in today reporting a "rougher" week with unexpected changes with school and one of the members of her church youth group committed suicide, an unexpected loss for her in a family she has known several years.  Anxious, stressed, sadness re: student that died.   Interventions: Cognitive Behavioral Therapy and Ego-Supportive  Diagnosis:   ICD-10-CM   1. Generalized anxiety disorder  F41.1      Plan: Patient not signing any updates for tx plan on computer screen due to COVID. We altered her long term and short term goals, and strategies a bit to better assess progress or lack of progress.  Treatment Goals: Some goals may remain on tx plan as patient works on them, however progress will be noted each session in "Progress" sections.  Long term goal: Stabilize anxiety level while increasing ability to function on a daily basis, as Evidenced by patient being able to function daily without  feeling her anxiety level impedes her functioning level.[name] term goal: Increase understanding of beliefs and messages that produce worry and amxiety, as Evidenced by patient clearly stating her improved understanding of negative/anxious thought patterns and how they affect how they lead to worry and anxiety.  Strategies: 1)Help patient develop reality-based, positive cognitive messages. 2)Increase daily use of positive self-talk.  Progressing: Patient spent part of session today processing feelings about a teenager's suicide from her church youth group. She had known the family for several years and did a good job of expressing her thoughts and feelings.  Also shared how she has not made much progress this week with so much going on .  In session, we worked some more on her automatic anxious thoughts and helping her be able to identify them more quickly and be able to maintain gains that are made rather than feeling like she is starting anew each time she works on goals.  Patient was able to look at strategies that may help this, especially the identification and interruption of anxious/negative thoughts. Will need to self-monitor more regularly in order to more quickly identify them.  She mentioned trying some visualizations in her efforts to catch and interrupt the negative thoughts and felt that may help her.  To continue self-monitoring of thoughts and will pick up next session. Keeping her "bullet journal with goal-directed actions".   Goal review and progress noted with patient.  Next appt within 2 wks.   Shanon Ace, LCSW

## 2019-06-10 ENCOUNTER — Ambulatory Visit (INDEPENDENT_AMBULATORY_CARE_PROVIDER_SITE_OTHER): Payer: 59 | Admitting: Psychiatry

## 2019-06-10 ENCOUNTER — Other Ambulatory Visit: Payer: Self-pay

## 2019-06-10 DIAGNOSIS — F411 Generalized anxiety disorder: Secondary | ICD-10-CM | POA: Diagnosis not present

## 2019-06-10 NOTE — Progress Notes (Signed)
[facility] Counselor/Therapist Progress Note  Patient ID: Vanessa Doyle, MRN: [id number],    Date: 06/10/2019  Time Spent: 81minutes 8:00am to 9:00am   Treatment Type: Individual Therapy  Reported Symptoms: anxiety, stressed "but a little less than last time", some depression   Mental Status Exam:  Appearance:   Casual     Behavior:  Appropriate and Sharing  Motor:  Normal  Speech/Language:   Normal Rate  Affect:  anxious  Mood:  anxious and depressed  Thought process:  normal  Thought content:    WNL  Sensory/Perceptual disturbances:    WNL  Orientation:  oriented to person, place, time/date, situation, day of week, month of year and year  Attention:  Good  Concentration:  Good  Memory:  WNL  Fund of knowledge:   Good  Insight:    Good  Judgment:   Good  Impulse Control:  Good   Risk Assessment: Danger to Self:  No Self-injurious Behavior: No Danger to Others: No Duty to Warn:no Physical Aggression / Violence:No  Access to Firearms a concern: No  Gang Involvement:No   Subjective:  Patient in today saying past week has been a "roller coaster" due mostly to some significant school assignments, however she was able to manage things some better and reports today that her "stressed feelings have been some less."  Interventions: Cognitive Behavioral Therapy and Solution-Oriented/Positive Psychology  Diagnosis:   ICD-10-CM   1. Generalized anxiety disorder  F41.1      Plan: Patient not signing any updates for tx plan on computer screen due to [place]. We altered her long term and short term goals, and strategies a bit to better assess progress or lack of progress.  Treatment Goals: Some goals may remain on tx plan as patient works on them, however progress will be noted each session in "Progress" sections.  Long term goal: Stabilize anxiety level while increasing ability to function on a daily basis, as Evidenced by patient being able to function daily  without feeling her anxiety level impedes her functioning level.Sheran Fava term goal: Increase understanding of beliefs and messages that produce worry and amxiety, as Evidenced by patient clearly stating her improved understanding of negative/anxious thought patterns and how they affect how they lead to worry and anxiety.  Strategies: 1)Help patient develop reality-based, positive cognitive messages. 2)Increase daily use of positive self-talk.  Progressing: Working in session today more on her "thought management" ( monitoring , catching negative/anxious thoughts and replacing them).  She does well in session but struggles sometimes on the outside. Is showing more progress, however, than previously which is encouraging. Today we worked more on better understanding the connection between some of her automatic thoughts and how she ends up feeling.  Gave several examples in session, including one that was heard and observed in session earlier. Patient is to continue  working to more quickly identify her negative/anxious thoughts so that her actions in replacing those thoughts can be more timely also.  Responds better to challenges in working on changes.  Admits today that "I've had anxiety for so long, it's hard to stop, and because it's been a part of me, I don't always recognize it."  Is doing better taking her night-time medication, "I took it this week on all except 1 night."  Processed more of her feelings about the suicide of a fellow youth group member from her church (following up from last visit).  Patient shares how she "really felt for  him and anybody who like me, struggles with mental health issus."  Feel that family has good support from church and she feels good about that. Understands that she needs to be more regular in her thought monitoring. States that using her bullet journal more can help in that area. Also states she knows she needs to be more regular in taking her meds, although is  taking most of them as precribed, "I just occasionally forget to take it or think of it too late."  Is adding her medication schedule onto her bullet journal.  Review of goals, and tips for organization that might help her.  Progress noted with patient.     Next appt within 2 weeks.   [name], LCSW

## 2019-06-17 ENCOUNTER — Other Ambulatory Visit: Payer: Self-pay

## 2019-06-17 ENCOUNTER — Ambulatory Visit (INDEPENDENT_AMBULATORY_CARE_PROVIDER_SITE_OTHER): Payer: 59 | Admitting: Psychiatry

## 2019-06-17 DIAGNOSIS — F411 Generalized anxiety disorder: Secondary | ICD-10-CM

## 2019-06-17 NOTE — Progress Notes (Signed)
      [facility] Counselor/Therapist Progress Note  Patient ID: Vanessa Doyle, MRN: [id number],    Date: 06/17/2019  Time Spent: 60 minutes   9:00am to 10:00am   Treatment Type: Individual Therapy  Reported Symptoms: anxiety, self-doubt, stress (mostly related to college), some depression  Mental Status Exam:  Appearance:   Casual     Behavior:  Appropriate and Sharing  Motor:  Normal  Speech/Language:   Normal Rate  Affect:  anxious, some depression  Mood:  anxious and depressed  Thought process:  normal  Thought content:    WNL  Sensory/Perceptual disturbances:    WNL  Orientation:  oriented to person, place, time/date, situation, day of week, month of year and year  Attention:  Good  Concentration:  Good  Memory:  WNL  Fund of knowledge:   Good  Insight:    Good  Judgment:   Good  Impulse Control:  Good   Risk Assessment: Danger to Self:  No Self-injurious Behavior: No Danger to Others: No Duty to Warn:no Physical Aggression / Violence:No  Access to Firearms a concern: No  Gang Involvement:No   Subjective: Patient in today with anxiety, stressed mostly with college, and some depression.  Involved in her church's Wed. Night college small group meeting.    Interventions: Cognitive Behavioral Therapy and Ego-Supportive  Diagnosis:   ICD-10-CM   1. Generalized anxiety disorder  F41.1     Plan: Patient not signing any updates for tx plan on computer screen due to [place].We altered her long term and short term goals, and strategies a bit to better assess progress or lack of progress.  Treatment Goals: Some goals may remain on tx plan as patient works on them, however progress will be noted each session in "Progress" sections.  Long term goal: Stabilize anxiety level while increasing ability to function on a daily basis, as Evidenced by patient being able to function daily without feeling her anxiety level impedes her functioning level.Sheran Fava term  goal: Increase understanding of beliefs and messages that produce worry and amxiety, as Evidenced by patient clearly stating her improved understanding of negative/anxious thought patterns and how they affect how they lead to worry and anxiety.  Strategies: 1)Help patient develop reality-based, positive cognitive messages. 2)Increase dailyuse of positive self-talk.  Progressing: Continued work on better "thought management" in trying to replace negative/anxious/self-doubting thoughts to thoughts that are more positive/reality-based/and encouraging.  This continues to be very challenging for patient however she is making effort and she is making some progress gradually.  In addition to working on improved cognitive messages, patient is also focused on trying to be more positive in her self-talk. Shared several example of this with patient and then she was able to share 1 as well. This type of effort is quite different from how patient use to be when she felt more stuck in her anxiety, low self confidence, and difficulty trusting others.  Review of goals and progress noted with patient.    Next appt within 2 weeks.   Shanon Ace, LCSW

## 2019-06-24 ENCOUNTER — Other Ambulatory Visit: Payer: Self-pay

## 2019-06-24 ENCOUNTER — Ambulatory Visit (INDEPENDENT_AMBULATORY_CARE_PROVIDER_SITE_OTHER): Payer: 59 | Admitting: Psychiatry

## 2019-06-24 DIAGNOSIS — F411 Generalized anxiety disorder: Secondary | ICD-10-CM | POA: Diagnosis not present

## 2019-06-24 NOTE — Progress Notes (Signed)
      [facility] Counselor/Therapist Progress Note  Patient ID: Vanessa Doyle, MRN: [id number],    Date: 06/24/2019  Time Spent: 60 minutes    8:00am to 9:00am  Treatment Type: Individual Therapy  Reported Symptoms: anxiety,stressed, some depression  Mental Status Exam:  Appearance:   Casual     Behavior:  Appropriate and Sharing  Motor:  Normal  Speech/Language:   Normal Rate  Affect:  anxious, some depression  Mood:  anxious  Thought process:  normal  Thought content:    WNL  Sensory/Perceptual disturbances:    WNL  Orientation:  oriented to person, place, time/date, situation, day of week, month of year and year  Attention:  Good  Concentration:  Good  Memory:  WNL  Fund of knowledge:   Good  Insight:    Good  Judgment:   Good  Impulse Control:  Good   Risk Assessment: Danger to Self:  No Self-injurious Behavior: No Danger to Others: No Duty to Warn:no Physical Aggression / Violence:No  Access to Firearms a concern: No  Gang Involvement:No   Subjective:  Patient in today with anxiety, stress, and some depression.  States she is not always taking her night-time meds due to schoolwork load. I encouraged her to make the meds a priority but she feels she can't do all of her work in a day's time even when she's not working except on Friday evening, and needs to take meds by 9:00pm so as to not have any hangover feelings next day.  Interventions: Solution-Oriented/Positive Psychology  Diagnosis:   ICD-10-CM   1. Generalized anxiety disorder  F41.1      Plan: Patient not signing any updates for tx plan on computer screen due to [place].We altered her long term and short term goals, and strategies a bit to better assess progress or lack of progress.  Treatment Goals: Some goals may remain on tx plan as patient works on them, however progress will be noted each session in "Progress" sections.  Long term goal: Stabilize anxiety level while increasing ability  to function on a daily basis, as Evidenced by patient being able to function daily without feeling her anxiety level impedes her functioning level.Sheran Fava term goal: Increase understanding of beliefs and messages that produce worry and amxiety, as Evidenced by patient clearly stating her improved understanding of negative/anxious thought patterns and how they affect how they lead to worry and anxiety.  Strategies: 1)Help patient develop reality-based, positive cognitive messages. 2)Increase dailyuse of positive self-talk.  Progressing: Noticed a lot of overthinking, which slows her down. Often gets caught up in organizing so we talked some today about settling down into actually doing tasks versus overthinking. Also mentions today that she clenches her teeth at times. Continued to encourage better self-care, stress reduction (which we've spoken about several times previously and reviewed some techniques with her today). Review of thought management strategies that we worked on last week and patient states she has tried but not been very successful--we looked at these again and how it can benefit patient, encouraging her to keep working with it, which she agreed. Goal review and patient efforts and strengths noted.    Next appt within 2 weeks.   Shanon Ace, LCSW

## 2019-06-30 ENCOUNTER — Ambulatory Visit (INDEPENDENT_AMBULATORY_CARE_PROVIDER_SITE_OTHER): Payer: 59 | Admitting: Psychiatry

## 2019-06-30 ENCOUNTER — Other Ambulatory Visit: Payer: Self-pay

## 2019-06-30 DIAGNOSIS — F411 Generalized anxiety disorder: Secondary | ICD-10-CM | POA: Diagnosis not present

## 2019-06-30 NOTE — Progress Notes (Signed)
[facility] Counselor/Therapist Progress Note  Patient ID: Vanessa Doyle, MRN: [id number],    Date: 06/30/2019  Time Spent: 60 minutes   8:00am to 9:00am  Treatment Type: Individual Therapy  Reported Symptoms: anxiety, stressed  Mental Status Exam:  Appearance:   Casual     Behavior:  Appropriate and Sharing  Motor:  Normal  Speech/Language:   Normal Rate  Affect:  anxious, stressed  Mood:  anxious  Thought process:  goal directed  Thought content:    WNL  Sensory/Perceptual disturbances:    WNL  Orientation:  oriented to person, place, time/date, situation, day of week, month of year and year  Attention:  Good  Concentration:  Good  Memory:  WNL  Fund of knowledge:   Good  Insight:    Good  Judgment:   Good  Impulse Control:  Good   Risk Assessment: Danger to Self:  No Self-injurious Behavior: No Danger to Others: No Duty to Warn:no Physical Aggression / Violence:No  Access to Firearms a concern: No  Gang Involvement:No   Subjective:  Patient in today reporting anxiety and feeling stressed.  School continues to be a significant source of stress.  Trying to not "overthink" (per last session)  as much, as that feeds the stress.  Interventions: Cognitive Behavioral Therapy and Ego-Supportive  Diagnosis:   ICD-10-CM   1. Generalized anxiety disorder  F41.1      Plan: Patient not signing any updates for tx plan on computer screen due to [place].We altered her long term and short term goals, and strategies a bit to better assess progress or lack of progress.  Treatment Goals: Some goals may remain on tx plan as patient works on them, however progress will be noted each session in "Progress" sections.  Long term goal: Stabilize anxiety level while increasing ability to function on a daily basis, as Evidenced by patient being able to function daily without feeling her anxiety level impedes her functioning level.Sheran Fava term goal: Increase  understanding of beliefs and messages that produce worry and amxiety, as Evidenced by patient clearly stating her improved understanding of negative/anxious thought patterns and how they affect how they lead to worry and anxiety.  Strategies: 1)Help patient develop reality-based, positive cognitive messages. 2)Increase dailyuse of positive self-talk.  Progressing: Continued work on decreasing her overthinking which she reports is hard not to do but is working on it, as it does work against her at times. 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 8 and 9".  Feels some increased stress over this.  Looked at some stress management and self-care issues that can help her including healthy eating and hydrating, exercise, take breaks from school work, slow deliberate deep breathing exercises, get outside more, allow for more sleep, connect with friends occasionally. Did some more work today on recognition of anxious thought and their effect on how we feel, with next step being to interrupt those thoughts and replace them with more calming, reality-based, positive thought patterns. Encouraged her to not feel it has to be a drastic change in thought and that even smaller changes that feel more do-able to her can help her anxiety level, and patient seemed to relate better to this perspective.  Goal review and patient's strengths and progress noted.     Next appt within 2 weeks.   Shanon Ace, LCSW

## 2019-07-15 ENCOUNTER — Ambulatory Visit: Payer: 59 | Admitting: Psychiatry

## 2019-07-17 ENCOUNTER — Other Ambulatory Visit: Payer: Self-pay | Admitting: Psychiatry

## 2019-07-17 DIAGNOSIS — F5105 Insomnia due to other mental disorder: Secondary | ICD-10-CM

## 2019-07-17 DIAGNOSIS — F411 Generalized anxiety disorder: Secondary | ICD-10-CM

## 2019-07-17 NOTE — Telephone Encounter (Signed)
Patient has not followed up since 63-month overdue mandated controlled substance appointment 04/05/2019 emergency supply of 15 temazepam.  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

## 2019-07-17 NOTE — Telephone Encounter (Signed)
Still no apt for f/u, sees Debbie D. Frequently

## 2019-07-22 ENCOUNTER — Ambulatory Visit (INDEPENDENT_AMBULATORY_CARE_PROVIDER_SITE_OTHER): Payer: 59 | Admitting: Psychiatry

## 2019-07-22 ENCOUNTER — Other Ambulatory Visit: Payer: Self-pay

## 2019-07-22 DIAGNOSIS — F411 Generalized anxiety disorder: Secondary | ICD-10-CM | POA: Diagnosis not present

## 2019-07-22 NOTE — Progress Notes (Signed)
      [facility] Counselor/Therapist Progress Note  Patient ID: JACKQUELYN SUNDBERG, MRN: [id number],    Date: 07/22/2019   Time Spent: 60 minutes  8:00am to 9:00am   Treatment Type: Individual Therapy  Reported Symptoms:  Anxiety, some depression, stressed  Mental Status Exam:  Appearance:   Casual     Behavior:  Appropriate and Sharing  Motor:  Normal  Speech/Language:   Normal Rate  Affect:  anxious  Mood:  anxious  Thought process:  goal directed  Thought content:    WNL  Sensory/Perceptual disturbances:    WNL  Orientation:  oriented to person, place, time/date, situation, day of week, month of year and year  Attention:  Good  Concentration:  Good  Memory:  WNL  Fund of knowledge:   Good  Insight:    Good  Judgment:   Good  Impulse Control:  Good   Risk Assessment: Danger to Self:  No Self-injurious Behavior: No Danger to Others: No Duty to Warn:no Physical Aggression / Violence:No  Access to Firearms a concern: No  Gang Involvement:No   Subjective:  Patient in today reporting anxiety, some depression, and stressed with school and closing out this semester.  Interventions: Cognitive Behavioral Therapy and Solution-Oriented/Positive Psychology  Diagnosis:   ICD-10-CM   1. Generalized anxiety disorder  F41.1     Plan: Patient not signing any updates for tx plan on computer screen due to [place].We altered her long term and short term goals, and strategies a bit to better assess progress or lack of progress.  Treatment Goals: Some goals may remain on tx plan as patient works on them, however progress will be noted each session in "Progress" sections.  Long term goal: Stabilize anxiety level while increasing ability to function on a daily basis, as Evidenced by patient being able to function daily without feeling her anxiety level impedes her functioning level.Sheran Fava term goal: Increase understanding of beliefs and messages that produce worry and  amxiety, as Evidenced by patient clearly stating her improved understanding of negative/anxious thought patterns and how they affect how they lead to worry and anxiety.  Strategies: 1)Help patient develop reality-based, positive cognitive messages. 2)Increase dailyuse of positive self-talk.  Progressing: Patient continues to work on her goals and today we worked more on her anxious thoughts leading to anxiety, worry, and increased stress.  She is making progress although it has been a challenge for patient as she has struggled with anxiety for a long time.  Used several examples, as I was listening to patient talk in session today. Looked at those examples and worked on replacing her highly anxious thoughts and was able to complete that task.  Vented some of current school stressors and discussed some organizational tips with her as well as encouraged less multi-tasking and more uninterrupted times to really focus and complete tasks. Is needing and is trying to work harder in holding onto her gains.  Continue to encourage small changes/gains versus it having to be a big change, so as to create less pressure for patient. Goal review and progress noted.   Next appt within 2 weeks.   Shanon Ace, LCSW

## 2019-07-26 ENCOUNTER — Other Ambulatory Visit: Payer: Self-pay

## 2019-07-26 ENCOUNTER — Encounter: Payer: Self-pay | Admitting: Psychiatry

## 2019-07-26 ENCOUNTER — Ambulatory Visit (INDEPENDENT_AMBULATORY_CARE_PROVIDER_SITE_OTHER): Payer: 59 | Admitting: Psychiatry

## 2019-07-26 VITALS — Ht 65.0 in | Wt 231.0 lb

## 2019-07-26 DIAGNOSIS — F411 Generalized anxiety disorder: Secondary | ICD-10-CM

## 2019-07-26 DIAGNOSIS — F5105 Insomnia due to other mental disorder: Secondary | ICD-10-CM | POA: Diagnosis not present

## 2019-07-26 MED ORDER — TEMAZEPAM 7.5 MG PO CAPS: 7.5000 mg | ORAL_CAPSULE | Freq: Every evening | ORAL | 5 refills | Status: DC | PRN

## 2019-07-26 NOTE — Progress Notes (Signed)
[facility]  Patient ID: Vanessa Doyle,  MRN: [id number]  PCP: Vanessa Pile, MD  Date of Evaluation: 07/26/2019 spent:15 minutes from 1105 to 1120  Chief Complaint:  Chief Complaint    Anxiety; Stress      HISTORY/CURRENT STATUS: Vanessa Doyle is seen onsite in office individually for the first time with me as both parents attended last appointment 15 minutes face-to-face with consent with epic collateral for psychiatric interview and exam in 69-month evaluation and management of anxiety and insomnia.  She continues attending [employer] now in the photography program having 4 classes this semester 1 of which is completed or terminated with 3 remaining, 2 of which are photography and the third multimedia.  Today is her last day of the semester but she resumes [employer] in January and is pleased with her education and future.  She has taken her Restoril conservatively when busy with school though anticipates taking it nightly on break to restore adequate sleep and rest for current deprivation.  She is most concerned today about the BuSpar causing afternoon dizziness taking 5 mg twice daily at breakfast and after lunch.  She processes that the afternoon dose may cumulatively have some of the remaining morning dose present.  She may be less hydrated and nourished in the afternoon or naturally more fatigued.  She has no dizziness in the morning just in the afternoon.  She has had many unexplained symptoms in the past no longer complaining of depression, panic, dissociation, or developmental fixation relative to the future. Her past complaints have been sufficiently considered and worked through with neurology helping with memory and limited extent of testing as patient is dramatic when she presents her symptoms but generally does not return very often for follow-up.  She still has psychotherapy however every week or two with Vanessa Fare, LCSW.  She is driving effectively with no accident or citation.  She does  hope to adjust the BuSpar but not change the medication as she is otherwise doing quite well.  She has no mania, suicidality, psychosis, or delirium.  She understands the mandate for 51-month appointments for continuing regular prescriptions for Restoril but does not follow through with these historically.   Anxiety Presents for follow-up visit. Symptoms include confusion, decreased concentration, dizziness, excessive worry, insomnia, muscle tension, nervous/anxious behavior and restlessness. Patient reports no compulsions, depressed mood, malaise, nausea, obsessions, palpitations or suicidal ideas. Symptoms occur most days. The severity of symptoms is moderate. The quality of sleep is poor. Nighttime awakenings: one to two.   Compliance with medications is 51-75%.    Individual Medical History/ Review of Systems: Changes? :Yes She had a left leg lesion with eschar in December 2019 treated in the ED patient comparing to a previous right lower extremity similar wound from the past,  both now healed. Her weight is up 31 pounds from 14 months ago and height is up three-quarter inch  Allergies: Patient has no known allergies.  Current Medications:  Current Outpatient Medications:  .  acetaminophen (TYLENOL) 325 MG tablet, Take 2 tablets (650 mg total) by mouth every 6 (six) hours as needed for fever or mild pain., Disp: , Rfl:  .  busPIRone (BUSPAR) 5 MG tablet, Take by mouth 1 tablet total 5 mg after breakfast and half tablet total 2.5 mg after lunch, Disp: 60 tablet, Rfl: 0 .  mupirocin ointment (BACTROBAN) 2 %, Apply 1 application topically 3 (three) times daily as needed (for one week). , Disp: , Rfl: 0 .  promethazine (  PHENERGAN) 25 MG tablet, TAKE 1/2 TO 1 TABLET AT ONSET OF MIGRAINE WITH NAUSEA. MAY REPEAT IN 6 HOURS IF NEEDED (Patient not taking: Reported on 04/13/2018), Disp: 20 tablet, Rfl: 3 .  temazepam (RESTORIL) 7.5 MG capsule, Take 1 capsule (7.5 mg total) by mouth at bedtime as needed.  for sleep, Disp: 30 capsule, Rfl: 5 .  tiZANidine (ZANAFLEX) 4 MG tablet, Take 1 tablet at bedtime for pain. (Patient not taking: Reported on 04/13/2018), Disp: 30 tablet, Rfl: 3 .  traMADol (ULTRAM) 50 MG tablet, Take 1 tablet (50 mg total) by mouth every 6 (six) hours as needed. (Patient not taking: Reported on 07/24/2018), Disp: 15 tablet, Rfl: 0  Medication Side Effects: dizziness/lightheadedness  Family Medical/ Social History: Changes? No  MENTAL HEALTH EXAM:  Height 5\' 5"  (1.651 m), weight 231 lb (104.8 kg).Body mass index is 38.44 kg/m. Muscle strengths and tone 5/5, postural reflexes and gait 0/0, and AIMS = 0 otherwise deferred for coronavirus shutdown  General Appearance: Casual, Fairly Groomed and Obese  Eye Contact:  Good  Speech:  Clear and Coherent, Normal Rate and Talkative  Volume:  Normal  Mood:  Anxious and Euthymic  Affect:  Congruent, Inappropriate, Labile, Full Range and Anxious  Thought Process:  Coherent, Goal Directed, Irrelevant and Descriptions of Associations: Tangential  Orientation:  Full (Time, Place, and Person)  Thought Content: Ilusions, Rumination, and Tangential   Suicidal Thoughts:  No  Homicidal Thoughts:  No  Memory:  Immediate;   Good Remote;   Good  Judgement:  Fair  Insight:  Fair to limited  Psychomotor Activity:  Normal and Mannerisms  Concentration:  Concentration: Fair and Attention Span: Good  Recall:  Good  Fund of Knowledge: Good  Language: Good  Assets:  Desire for Improvement Resilience Talents/Skills Vocational/Educational  ADL's:  Intact  Cognition: WNL  Prognosis:  Fair    DIAGNOSES:    ICD-10-CM   1. Generalized anxiety disorder  F41.1 temazepam (RESTORIL) 7.5 MG capsule    busPIRone (BUSPAR) 5 MG tablet  2. Insomnia disorder, with non-sleep disorder mental comorbidity, persistent  F51.05 temazepam (RESTORIL) 7.5 MG capsule    Receiving Psychotherapy: Yes  with Vanessa Ace, LCSW.   RECOMMENDATIONS: With current  comprehensive analysis integrated with past treatment experience, reducing the afternoon BuSpar is likely sufficient for symptom complaint of dizziness and lightheadedness limited to the afternoon.  Psychosupportive psychoeducation prepares patient with cognitive behavioral format for symptom reduction and function completion for her lunch schooling.  BuSpar is reduced 5 mg tablet to 1 tablet every morning and 1/2 tablet daily after lunch sent as number 60 tablets with no refill to use [facility] at [place] [place] for which she will request refills should this be successful as a security mechanism as patient never recontacts or returns for planned or needed follow-up.  She is E scribed Remeron 7.5 mg every bedtime as #30 with 5 refills to [place] [facility] for insomnia shoulder and generalized anxiety.  She returns for 6 months for follow up or sooner if needed.  Delight Hoh, MD

## 2019-07-29 ENCOUNTER — Other Ambulatory Visit: Payer: Self-pay

## 2019-07-29 ENCOUNTER — Ambulatory Visit (INDEPENDENT_AMBULATORY_CARE_PROVIDER_SITE_OTHER): Payer: 59 | Admitting: Psychiatry

## 2019-07-29 DIAGNOSIS — F411 Generalized anxiety disorder: Secondary | ICD-10-CM

## 2019-07-29 NOTE — Progress Notes (Signed)
      [facility] Counselor/Therapist Progress Note  Patient ID: Vanessa Doyle, MRN: [id number],    Date: 07/29/2019  Time Spent: 60 minutes   8:00am to 9:00am  Treatment Type: Individual Therapy  Reported Symptoms:  Anxiety,   Mental Status Exam:  Appearance:   Casual     Behavior:  Appropriate and Sharing  Motor:  Normal  Speech/Language:   Normal Rate  Affect:  anxious  Mood:  anxious  Thought process:  normal  Thought content:    WNL  Sensory/Perceptual disturbances:    WNL  Orientation:  oriented to person, place, time/date, situation, day of week, month of year and year  Attention:  Good  Concentration:  Good  Memory:  WNL  Fund of knowledge:   Good  Insight:    Good  Judgment:   Good  Impulse Control:  Good   Risk Assessment: Danger to Self:  No Self-injurious Behavior: No Danger to Others: No Duty to Warn:no Physical Aggression / Violence:No  Access to Firearms a concern: No  Gang Involvement:No   Subjective:  Patient in today reporting anxiety, with little depression.  Currently on holiday break so the stress of college is not an issue.    Interventions: Cognitive Behavioral Therapy and Solution-Oriented/Positive Psychology  Diagnosis:   ICD-10-CM   1. Generalized anxiety disorder  F41.1      Plan: Patient not signing any updates for tx plan on computer screen due to [place].We altered her long term and short term goals, and strategies a bit to better assess progress or lack of progress.  Treatment Goals: Some goals may remain on tx plan as patient works on them, however progress will be noted each session in "Progress" sections.  Long term goal: Stabilize anxiety level while increasing ability to function on a daily basis, as Evidenced by patient being able to function daily without feeling her anxiety level impedes her functioning level.Sheran Fava term goal: Increase understanding of beliefs and messages that produce worry and amxiety, as  Evidenced by patient clearly stating her improved understanding of negative/anxious thought patterns and how they affect how they lead to worry and anxiety.  Strategies: 1)Help patient develop reality-based, positive cognitive messages. 2)Increase dailyuse of positive self-talk.  Progressing:  Patient reports past week has not been as stressful as she's now on holiday break.  Is feeling some stress with not having completed her 4 yr college applications, hoping to enroll in the Fall of 2021, after completing her 2nd yr at [employer].  Sleep medicine is being taken with more regularity so is helping her more and feeling less sleepy during the day. Is continuing to work on identifying and replacing anxious/negative thoughts with more positive, reality-based, hopeful thoughts. Shares some success and also some times where she wasn't able to identify the negative/anxious thoughts very quickly. Encouraged her to increase her practice of positive self-talk, with examples provided to her.  She has noticed that it has helped in the past but has not been as focused on it lately. Also to build some exercise into most days and to stay in touch with family/friends during this COVID time. Goal review and progress/efforts noted with patient.  Next appt within 2 weeks.   Shanon Ace, LCSW

## 2019-08-11 ENCOUNTER — Ambulatory Visit (INDEPENDENT_AMBULATORY_CARE_PROVIDER_SITE_OTHER): Payer: 59 | Admitting: Psychiatry

## 2019-08-11 ENCOUNTER — Other Ambulatory Visit: Payer: Self-pay

## 2019-08-11 DIAGNOSIS — F411 Generalized anxiety disorder: Secondary | ICD-10-CM | POA: Diagnosis not present

## 2019-08-11 NOTE — Progress Notes (Signed)
[facility] Counselor/Therapist Progress Note  Patient ID: Vanessa Doyle, MRN: [id number],    Date: 08/11/2019  Time Spent: 60 minutes  8:00am to 9:00am  Treatment Type: Individual Therapy  Reported Symptoms: anxiety, some depression  Mental Status Exam:  Appearance:   Casual     Behavior:  Appropriate and Sharing  Motor:  Normal  Speech/Language:   Normal Rate  Affect:  anxious, tired  Mood:  anxious  Thought process:  normal  Thought content:    WNL  Sensory/Perceptual disturbances:    WNL  Orientation:  oriented to person, place, time/date, situation, day of week, month of year and year  Attention:  Good  Concentration:  Good  Memory:  WNL  Fund of knowledge:   Good  Insight:    Good  Judgment:   Good  Impulse Control:  Good   Risk Assessment: Danger to Self:  No Self-injurious Behavior: No Danger to Others: No Duty to Warn:no Physical Aggression / Violence:No  Access to Firearms a concern: No  Gang Involvement:No   Subjective: Patient in today reporting anxiety and some depression.  More recenty realized how her spending money unwisely, has fed her anxiety.  Has taken a couple steps to be more thoughtful in spending past week and noticed a difference in her current anxiety. Is on break from school til Jan. 11, 2021.   Interventions: Cognitive Behavioral Therapy and Solution-Oriented/Positive Psychology  Diagnosis:   ICD-10-CM   1. Generalized anxiety disorder  F41.1     Plan: Patient not signing any updates for tx plan on computer screen due to [place].We altered her long term and short term goals, and strategies a bit to better assess progress or lack of progress.  Treatment Goals: Some goals may remain on tx plan as patient works on them, however progress will be noted each session in "Progress" sections.  Long term goal: Stabilize anxiety level while increasing ability to function on a daily basis, as Evidenced by patient being able to  function daily without feeling her anxiety level impedes her functioning level.Vanessa Doyle term goal: Increase understanding of beliefs and messages that produce worry and amxiety, as Evidenced by patient clearly stating her improved understanding of negative/anxious thought patterns and how they affect how they lead to worry and anxiety.  Strategies: 1)Help patient develop reality-based, positive cognitive messages. 2)Increase dailyuse of positive self-talk.  Progressing:  Began today by reviewing goals before getting deeper into talking.  She feels a little improvement in implementing more of a routine, and working harder on positive thoughts.  Doing yoga in a class multiple days a week.  Not as "on edge" today. In midst of applying for 22yr college to finish up a bachelor's degree "in Arts or fine arts."  Continues to be better with taking her sleep meds and ends up feeling better when getting more adequate sleep. Shares that she continues to work on interrupting them and trying to replace them with more positive, reality-based, and empowering thought patterns.  Admits, especially on a hectic day, that some of the anxious/negative thoughts "get by me but I continue to work at this."  Also continues to work on improving her self-talk and understanding her need to "Let things Go" and be able to move forward.  Worked on some re-framing today of some negative thoughts and she did quite well, however she feels it is more of a challenge to do it on her own.  Encouraged her to practice  it on her own, being patient in the process. Good that she has begun yoga as part of her goal to build in some exercise into her weekly routine. Goal review and progress/efforts noted with patient.  Next appt within 2 weeks.   [name], LCSW

## 2019-08-18 ENCOUNTER — Ambulatory Visit (INDEPENDENT_AMBULATORY_CARE_PROVIDER_SITE_OTHER): Payer: 59 | Admitting: Psychiatry

## 2019-08-18 DIAGNOSIS — F411 Generalized anxiety disorder: Secondary | ICD-10-CM | POA: Diagnosis not present

## 2019-08-18 NOTE — Progress Notes (Signed)
[facility] Counselor/Therapist Progress Note  Patient ID: Vanessa Doyle, MRN: [id number],    Date: 08/18/2019   Time Spent:  60 minutes  9:00am to 10:00am  Virtual Visit via Video Note Connected with patient by a video enabled telemedicine/telehealth application, with their informed consent, and verified patient privacy and that I am speaking with the correct person using two identifiers. I discussed the limitations, risks, security and privacy concerns of performing psychotherapy and management service by telephone and the availability of in person appointments. I also discussed with the patient that there may be a patient responsible charge related to this service. The patient expressed understanding and agreed to proceed. I discussed the treatment planning with the patient. The patient was provided an opportunity to ask questions and all were answered. The patient agreed with the plan and demonstrated an understanding of the instructions. The patient was advised to call  our office if  symptoms worsen or feel they are in a crisis state and need immediate contact.   Therapist Location: [facility] Patient Location: home   Treatment Type: Individual Therapy  Reported Symptoms:  Anxiety, stressed especially now having to quarantine due to dad's exposure to Covid at work.  Mental Status Exam:  Appearance:   Casual     Behavior:  Appropriate and Sharing  Motor:  Normal  Speech/Language:   Normal Rate  Affect:  anxious, stressed  Mood:  anxious  Thought process:  normal  Thought content:    WNL  Sensory/Perceptual disturbances:    WNL  Orientation:  oriented to person, place, time/date, situation, day of week, month of year and year  Attention:  Good  Concentration:  Good  Memory:  WNL forgetfulness at times and under stress  Fund of knowledge:   Good  Insight:    Good  Judgment:   Good  Impulse Control:  Good   Risk Assessment: Danger to Self:   No Self-injurious Behavior: No Danger to Others: No Duty to Warn:no Physical Aggression / Violence:No  Access to Firearms a concern: No  Gang Involvement:No   Subjective: Patient today reporting anxiety and is stressed. Doing her session by telehealth video.  Has begun doing more exercise and currently doing a 30-Day Yoga Challenge  Interventions: Cognitive Behavioral Therapy and Solution-Oriented/Positive Psychology  Diagnosis:   ICD-10-CM   1. Generalized anxiety disorder  F41.1     Plan: Patient not signing any updates for tx plan on computer screen due to [place].We altered her long term and short term goals, and strategies a bit to better assess progress or lack of progress.  Treatment Goals: Some goals may remain on tx plan as patient works on them, however progress will be noted each session in "Progress" sections.  Long term goal: Stabilize anxiety level while increasing ability to function on a daily basis, as Evidenced by patient being able to function daily without feeling her anxiety level impedes her functioning level.Sheran Fava term goal: Increase understanding of beliefs and messages that produce worry and amxiety, as Evidenced by patient clearly stating her improved understanding of negative/anxious thought patterns and how they affect how they lead to worry and anxiety.  Strategies: 1)Help patient develop reality-based, positive cognitive messages. 2)Increase dailyuse of positive self-talk.  Progressing: Patient continues her goal-related focus and strategies.  Has been keeping her routine that she recently began as it helps her prioritizing her tasks, being more mindful of her commitments and budgeting time, improves her focus, and to  feel less "frazzled and unorganized".  The structure of a routine needs to continue. Starting on college apps later today or tomorrow for completing a 4 yr degree.  Is cooperating more with taking her night meds and getting  better sleep. Is working to improve her self-talk and gave several examples in session today, which is an improvement for her.  Overthinking has been more of a problem while quarantining due to Covid exposure.  Shared that she is "a little better at letting go of negativity in order to move forward, and shared her experience with that, admitting that she sees the benefit but difficult due to history of "trouble letting go."  Re-framing thoughts reviewed. Remaining on meds as prescribed. On 1-10 anxiety scale, patient rates herself as a "6" today.  Goal review and progress noted with patient.  Next appt within 1-2 weeks.   [name], LCSW

## 2019-08-25 ENCOUNTER — Ambulatory Visit (INDEPENDENT_AMBULATORY_CARE_PROVIDER_SITE_OTHER): Payer: 59 | Admitting: Psychiatry

## 2019-08-25 ENCOUNTER — Other Ambulatory Visit: Payer: Self-pay

## 2019-08-25 DIAGNOSIS — F411 Generalized anxiety disorder: Secondary | ICD-10-CM

## 2019-08-25 NOTE — Progress Notes (Signed)
[facility] Counselor/Therapist Progress Note  Patient ID: Vanessa Doyle, MRN: [id number],    Date: 08/25/2019  Time Spent: 60 minutes  9:00am to 10:00am    Treatment Type: Individual Therapy  Reported Symptoms: anxiety, less tired and reports getting better sleep  Mental Status Exam:  Appearance:   Casual     Behavior:  Appropriate and Sharing  Motor:  Normal  Speech/Language:   Normal Rate  Affect:  anxious  Mood:  anxious  Thought process:  goal directed  Thought content:    WNL  Sensory/Perceptual disturbances:    WNL  Orientation:  oriented to person, place, time, situation, date, week  Attention:  Good  Concentration:  Good  Memory:  sometimes forgetful especually when stressed  Fund of knowledge:   Good  Insight:    Good  Judgment:   Good  Impulse Control:  Good   Risk Assessment: Danger to Self:  No Self-injurious Behavior: No Danger to Others: No Duty to Warn:no Physical Aggression / Violence:No  Access to Firearms a concern: No  Gang Involvement:No   Subjective:  Patient in today saying "I've done some better this week.  Got better sleep. Keeping up with my yoga and that helps me physically and mentally.    Interventions: Cognitive Behavioral Therapy and Solution-Oriented/Positive Psychology  Diagnosis:   ICD-10-CM   1. Generalized anxiety disorder  F41.1     Plan: Patient not signing any updates for tx plan on computer screen due to [place].We altered her long term and short term goals, and strategies a bit to better assess progress or lack of progress.  Treatment Goals: Some goals may remain on tx plan as patient works on them, however progress will be noted each session in "Progress" sections.  Long term goal: Stabilize anxiety level while increasing ability to function on a daily basis, as Evidenced by patient being able to function daily without feeling her anxiety level impedes her functioning level.Vanessa Doyle term goal: Increase  understanding of beliefs and messages that produce worry and amxiety, as Evidenced by patient clearly stating her improved understanding of negative/anxious thought patterns and how they affect how they lead to worry and anxiety.  Strategies: 1)Help patient develop reality-based, positive cognitive messages. 2)Increase dailyuse of positive self-talk.  Progressing: Patient following up from prior session and feeling she has been better in her follow-through and has in some ways better managed some of her stress.  Has kept the new routine that seems to be working well for her and helping her be more organized. Had to delay college apps f(to complete a 4 yr degree) or more info and to start them this week/weekend. Reports some improvement in her self-talk "in some areas and not improved in other areas such as body image" and is working on this.  Wants to lose the weight she's gained during pandemic. Overthinking is still happening but has lessened. Still taking her night meds more regularly, helping her sleep to be better. The current semester at school has just recently started and patient is trying to begin on a stronger, more grounded note with the goal of her managing her stress rather than the stress managing her, and we discussed how she feels she is doing and what she can do to be successful and encourage herself through difficult situations through visualizing herself being successful, sticking with routine, positive thought patterns, and positive, empowering self-talk.  Affirmed with her that it's not an automatic change but rather  a gradual process to be working on. On 1-10 scale, patient rates herself as a "5" (one step less anxious than last visit).  Goal review and increased progress/challenges noted with patient.  Next appt within 1-2 weeks.   Vanessa Fare, LCSW

## 2019-09-01 ENCOUNTER — Other Ambulatory Visit: Payer: Self-pay

## 2019-09-01 ENCOUNTER — Ambulatory Visit (INDEPENDENT_AMBULATORY_CARE_PROVIDER_SITE_OTHER): Payer: 59 | Admitting: Psychiatry

## 2019-09-01 DIAGNOSIS — F411 Generalized anxiety disorder: Secondary | ICD-10-CM | POA: Diagnosis not present

## 2019-09-01 NOTE — Progress Notes (Signed)
      [facility] Counselor/Therapist Progress Note  Patient ID: Vanessa Doyle, MRN: [id number],    Date: 09/01/2019  Time Spent: 60 minutes  8:00am to 9:00am  Treatment Type: Individual Therapy  Reported Symptoms: anxious, stressed (pandemic, school, internship, applications for completing 4 yr degree.)  Mental Status Exam:  Appearance:   Casual     Behavior:  Appropriate, Sharing and Motivated  Motor:  Normal  Speech/Language:   Normal Rate  Affect:  anxious  Mood:  anxious and stressed  Thought process:  goal directed  Thought content:    WNL  Sensory/Perceptual disturbances:    WNL  Orientation:  oriented to person, place, time/date, situation, day of week, month of year and year  Attention:  Good  Concentration:  Good  Memory:  WNL  Fund of knowledge:   Good  Insight:    Good  Judgment:   Good  Impulse Control:  Good   Risk Assessment: Danger to Self:  No Self-injurious Behavior: No Danger to Others: No Duty to Warn:no Physical Aggression / Violence:No  Access to Firearms a concern: No  Gang Involvement:No   Subjective: Patient in today reporting anxiousness and feeling stressed with school, internship, and issues re: applying to complete her 4 yr degree.   Interventions: Cognitive Behavioral Therapy and Solution-Oriented/Positive Psychology  Diagnosis:   ICD-10-CM   1. Generalized anxiety disorder  F41.1     Plan: Patient not signing any updates for tx plan on computer screen due to COVID.We altered her long term and short term goals, and strategies a bit to better assess progress or lack of progress.  Treatment Goals: Some goals may remain on tx plan as patient works on them, however progress will be noted each session in "Progress" sections.  Long term goal: Stabilize anxiety level while increasing ability to function on a daily basis, as Evidenced by patient being able to function daily without feeling her anxiety level impedes her functioning  level.Vanessa Doyle term goal: Increase understanding of beliefs and messages that produce worry and amxiety, as Evidenced by patient clearly stating her improved understanding of negative/anxious thought patterns and how they affect how they lead to worry and anxiety.  Strategies: 1)Help patient develop reality-based, positive cognitive messages. 2)Increase dailyuse of positive self-talk.  Progressing: Patient is making progress on her goals. Noticeable improvement in managing anxiety as "I'm not freaking out as much now, and still working on it."  Patient is becoming some more confident, working to set healthy boundaries, and not take on other people's issues. Difficulty handling situations where she doesn't know all the details and expectations, and needing to refrain from so many "what if's, overthinking, looking for what might go wrong:. Shared a handout with her on overthinking, which she found helpful as we related it to her specific habit of overthinking. To focus more on this between sessions and work to interrupt the patterns of overthinking. Will follow up on this next session.  Building more resiliency and confidence. On 1-10 scale she rates herself a "5/6" today.  Goal review and progress/efforts noted with patient.   Next appt within 1-2 weeks.   Vanessa Fare, LCSW

## 2019-09-08 ENCOUNTER — Other Ambulatory Visit: Payer: Self-pay

## 2019-09-08 ENCOUNTER — Ambulatory Visit (INDEPENDENT_AMBULATORY_CARE_PROVIDER_SITE_OTHER): Payer: 59 | Admitting: Psychiatry

## 2019-09-08 DIAGNOSIS — F411 Generalized anxiety disorder: Secondary | ICD-10-CM

## 2019-09-08 NOTE — Progress Notes (Signed)
      [facility] Counselor/Therapist Progress Note  Patient ID: Vanessa Doyle, MRN: [id number],    Date: 09/08/2019  Time Spent: 60 minutes 8:00am to 9:00am  Treatment Type: Individual Therapy  Reported Symptoms: anxiety, stress (some better)  Mental Status Exam:  Appearance:   Casual     Behavior:  Appropriate and Sharing  Motor:  Normal  Speech/Language:   Normal Rate  Affect:  anxious (and is improving), stressed (managing some better)  Mood:  anxious  Thought process:  normal  Thought content:    WNL  Sensory/Perceptual disturbances:    WNL  Orientation:  oriented to person, place, time/date, situation, day of week, month of year and year  Attention:  Good  Concentration:  Good  Memory:  WNL  Fund of knowledge:   Good  Insight:    Good  Judgment:   Good  Impulse Control:  Good   Risk Assessment: Danger to Self:  No Self-injurious Behavior: No Danger to Others: No Duty to Warn:no Physical Aggression / Violence:No  Access to Firearms a concern: No  Gang Involvement:No   Subjective: Patient reports some improvement in her anxiety/stress. 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  Interventions: Cognitive Behavioral Therapy and Solution-Oriented/Positive Psychology  Diagnosis:   ICD-10-CM   1. Generalized anxiety disorder  F41.1     Plan: Patient not signing any updates for tx plan on computer screen due to COVID.We altered her long term and short term goals, and strategies a bit to better assess progress or lack of progress.  Treatment Goals: Some goals may remain on tx plan as patient works on them, however progress will be noted each session in "Progress" sections.  Long term goal: Stabilize anxiety level while increasing ability to function on a daily basis, as Evidenced by patient being able to function daily without feeling her anxiety level impedes her functioning level.[name] term goal: Increase understanding  of beliefs and messages that produce worry and amxiety, as Evidenced by patient clearly stating her improved understanding of negative/anxious thought patterns and how they affect how they lead to worry and anxiety.  Strategies: 1)Help patient develop reality-based, positive cognitive messages. 2)Increase dailyuse of positive self-talk.  Progressing: Improvement noted on some of patient's management of anxiety.  Today she is present and working further on this, recognizing what she is doing differently in er approach and thinking during anxious situation.  Reviewed a handout on Overthinking that I gave her at end of last session, which has help her some. Discussed her intercepting the overthinking, and practice trying "to be mindful of her overthinking and interrupt it, replacing it with more positive alternatives and develop a tolerance to deal with outcomes that are not "perfect".  This is challenging for patient, and she is willing to work on it.  Some improvement in motivation.  Goal review and progress noted with patient.  Next visit within 1-2 weeks.   [name], LCSW

## 2019-09-15 ENCOUNTER — Other Ambulatory Visit: Payer: Self-pay

## 2019-09-15 ENCOUNTER — Ambulatory Visit (INDEPENDENT_AMBULATORY_CARE_PROVIDER_SITE_OTHER): Payer: 59 | Admitting: Psychiatry

## 2019-09-15 DIAGNOSIS — F411 Generalized anxiety disorder: Secondary | ICD-10-CM

## 2019-09-15 NOTE — Progress Notes (Signed)
      [facility] Counselor/Therapist Progress Note  Patient ID: SECILIA APPS, MRN: [id number],    Date: 09/15/2019  Time Spent: 60 minutes   8:00am to 9:00am  Treatment Type: Individual Therapy  Reported Symptoms: anxiety, intermittent depression, stressed (some better)  Mental Status Exam:  Appearance:   Casual     Behavior:  Appropriate and Sharing  Motor:  Normal  Speech/Language:   Normal Rate  Affect:  anxious, some depression  Mood:  anxious  Thought process:  goal directed  Thought content:    WNL  Sensory/Perceptual disturbances:    WNL  Orientation:  oriented to person, place, time/date, situation, day of week, month of year and year  Attention:  Good  Concentration:  Good  Memory:  WNL  Fund of knowledge:   Good  Insight:    Good  Judgment:   Good  Impulse Control:  Good   Risk Assessment: Danger to Self:  No Self-injurious Behavior: No Danger to Others: No Duty to Warn:no Physical Aggression / Violence:No  Access to Firearms a concern: No  Gang Involvement:No   Subjective:  Patient in today reporting being tired and nervous after a busy week 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  Relieved in some ways, and not quite as stressed.   Interventions: Cognitive Behavioral Therapy and Solution-Oriented/Positive Psychology  Diagnosis:   ICD-10-CM   1. Generalized anxiety disorder  F41.1      Plan: Patient not signing any updates for tx plan on computer screen due to COVID.We altered her long term and short term goals, and strategies a bit to better assess progress or lack of progress.  Treatment Goals: Some goals may remain on tx plan as patient works on them, however progress will be noted each session in "Progress" sections.  Long term goal: Stabilize anxiety level while increasing ability to function on a daily basis, as Evidenced by patient being able to function daily without feeling her anxiety level  impedes her functioning level.[name] term goal: Increase understanding of beliefs and messages that produce worry and amxiety, as Evidenced by patient clearly stating her improved understanding of negative/anxious thought patterns and how they affect how they lead to worry and anxiety.  Strategies: 1)Help patient develop reality-based, positive cognitive messages. 2)Increase dailyuse of positive self-talk.  Progressing: Patient still working on her overall anxiety which she has better managed more recently. Worked today on Strategy #1 in developing more reality-based, positive, cognitive messages. Focusing on stopping her overthinking, as she is becoming more aware of how it doesn't really help her and instead it makes things more difficult. Processed some of her feelings about how overthinking "tends to make her more assured that all will go well," but later realizes overthinking does not guarantee thinks will go well. Important for patient to work on tolerance for when things don't go as expected or as she had hoped, and we discussed this more today. Goal review and progress noted with patient.  Next appt within 2 weeks.   [name], LCSW

## 2019-09-22 ENCOUNTER — Ambulatory Visit (INDEPENDENT_AMBULATORY_CARE_PROVIDER_SITE_OTHER): Payer: 59 | Admitting: Psychiatry

## 2019-09-22 ENCOUNTER — Other Ambulatory Visit: Payer: Self-pay

## 2019-09-22 DIAGNOSIS — F411 Generalized anxiety disorder: Secondary | ICD-10-CM | POA: Diagnosis not present

## 2019-09-22 NOTE — Progress Notes (Signed)
[facility] Counselor/Therapist Progress Note  Patient ID: Vanessa Doyle, MRN: [id number],    Date: 09/22/2019  Time Spent: 60 minutes   8:00am to 9:00am  Treatment Type: Individual Therapy  Reported Symptoms:  Anxiety, stressed, depression (decreased), some frustrations  Mental Status Exam:  Appearance:   Casual     Behavior:  Appropriate and Sharing  Motor:  Normal  Speech/Language:   Normal Rate  Affect:  anxious althought anxiety is decreased   Mood:  anxious and depressed (depression decreased)  Thought process:  normal  Thought content:    WNL  Sensory/Perceptual disturbances:    WNL  Orientation:  oriented to person, place, time/date, situation, day of week, month of year and year  Attention:  Good  Concentration:  Good  Memory:  WNL  Fund of knowledge:   Good  Insight:    Good  Judgment:   Good  Impulse Control:  Good   Risk Assessment: Danger to Self:  No Self-injurious Behavior: No Danger to Others: No Duty to Warn:no Physical Aggression / Violence:No  Access to Firearms a concern: No  Gang Involvement:No    Subjective:  Patient today reports the past week has been a roller coaster of emotions including feeling stressed, excited, anxious, and decreased depression.   Interventions: Cognitive Behavioral Therapy and Solution-Oriented/Positive Psychology  Diagnosis:   ICD-10-CM   1. Generalized anxiety disorder  F41.1      Plan: Patient not signing any updates for tx plan on computer screen due to COVID.We altered her long term and short term goals, and strategies a bit to better assess progress or lack of progress.  Treatment Goals: Some goals may remain on tx plan as patient works on them, however progress will be noted each session in "Progress" sections.  Long term goal: Stabilize anxiety level while increasing ability to function on a daily basis, as Evidenced by patient being able to function daily without feeling her anxiety level  impedes her functioning level.Vanessa Doyle term goal: Increase understanding of beliefs and messages that produce worry and amxiety, as Evidenced by patient clearly stating her improved understanding of negative/anxious thought patterns and how they affect how they lead to worry and anxiety.  Strategies: 1)Help patient develop reality-based, positive cognitive messages. 2)Increase dailyuse of positive self-talk.  Progressing: Patient reports mixed emotions of anxiety, feeling stress but does seem to be managing it some better, depression (decreasing), and frustrations.  Feeling some increased stress and excitement due to getting accepted into college at [employer]. 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 Has taken her night meds about half the time, which does help her sleeping. She states that she can't take it every night because of sometimes having to stay up later for school assignments.  Is trying to be more organized and seems to be more successful with that. Continues her goal-directed work of reducing her anxious thoughts by replacing them with more positive, reality-based, and empowering thoughts.  Also improving some on her self-talk recently, to be more positive.  Reflected with patient on her progress with anxiety and compared to when she first began treatment, she is realizing more some of the gains she has made in anxiety management, and is more motivated.  Goal review and progress noted with patient.    Next appt within 2 weeks.   Vanessa Fare, LCSW

## 2019-09-29 ENCOUNTER — Ambulatory Visit: Payer: 59 | Admitting: Psychiatry

## 2019-09-29 ENCOUNTER — Ambulatory Visit (INDEPENDENT_AMBULATORY_CARE_PROVIDER_SITE_OTHER): Payer: 59 | Admitting: Psychiatry

## 2019-09-29 ENCOUNTER — Other Ambulatory Visit: Payer: Self-pay

## 2019-09-29 DIAGNOSIS — F411 Generalized anxiety disorder: Secondary | ICD-10-CM | POA: Diagnosis not present

## 2019-09-29 NOTE — Progress Notes (Signed)
[facility] Counselor/Therapist Progress Note  Patient ID: Vanessa Doyle, MRN: [id number],    Date: 09/29/2019  Time Spent: 60 minutes   3:00pm to 4:00pm   Treatment Type: Individual Therapy  Reported Symptoms: anxiety, some depression,   Mental Status Exam:  Appearance:   Casual     Behavior:  Appropriate, Sharing and Motivated  Motor:  Normal  Speech/Language:   Normal Rate  Affect:  anxious  Mood:  anxious and depressed (depression is some less)  Thought process:  normal  Thought content:    WNL  Sensory/Perceptual disturbances:    WNL  Orientation:  oriented to person, place, time/date, situation, day of week, month of year and year  Attention:  Good  Concentration:  Good  Memory:  WNL  Fund of knowledge:   Good  Insight:    Good  Judgment:   Good  Impulse Control:  Good   Risk Assessment: Danger to Self:  No Self-injurious Behavior: No Danger to Others: No Duty to Warn:no Physical Aggression / Violence:No  Access to Firearms a concern: No  Gang Involvement:No   Subjective: Patient today reporting continued anxiety and some depression. Some increased in motivation.  Excited about going to 4 yr college in the Fall of this year.  Affect is more full and smiling some more today.    Interventions: Cognitive Behavioral Therapy and Solution-Oriented/Positive Psychology  Diagnosis:   ICD-10-CM   1. Generalized anxiety disorder  F41.1     Plan: Patient not signing any updates for tx plan on computer screen due to [place].We altered her long term and short term goals, and strategies a bit to better assess progress or lack of progress.  Treatment Goals: Some goals may remain on tx plan as patient works on them, however progress will be noted each session in "Progress" sections.  Long term goal: Stabilize anxiety level while increasing ability to function on a daily basis, as Evidenced by patient being able to function daily without feeling her anxiety  level impedes her functioning level.Sheran Fava term goal: Increase understanding of beliefs and messages that produce worry and amxiety, as Evidenced by patient clearly stating her improved understanding of negative/anxious thought patterns and how they affect how they lead to worry and anxiety.  Strategies: 1)Help patient develop reality-based, positive cognitive messages. 2)Increase dailyuse of positive self-talk.  Progressing: Patient in today and has had some increased anxiety and lessened depression. Some increase in her motivation. Reviewed some of her progress from when she was first seen here.  Together we noted some of her progress as being: "I've become more chill as things don't startle me as much as they used to, is more hopeful, a good type of stubbornness and not giving up on things as easily, more goal-focused and "a clearer understanding of my goals and the steps needed, and she has started believing in herself more.  Is needing to learn how to adapt better living with parents until she goes to [employer] in the Fall to complete 43yr college. Recognizes that she sometimes tries to do too much with some unrealistic time frames and it ends up adding to her stress, and knows she needs to not "overly expect things of herself where her expectations are not realistic  Also admits she is needing to work harder on "getting my anxiety more under control" and shares that she think she started having anxiety issues in elementary school but states she "is not aware of how/why  it started."  Encouraged her regular use of her night medication that helps her sleep. Also to continue her self-monitoring of thoughts and paying attention to thoughts that trigger anxious or depressive feelings and work to interrupt them and replace them with more positive, calming, realistic, and empowering thoughts which do not support anxiety nor depression.   Goal review and progress noted with patient.   Next appt  within 1-2 weeks.   [name], LCSW

## 2019-10-06 ENCOUNTER — Ambulatory Visit (INDEPENDENT_AMBULATORY_CARE_PROVIDER_SITE_OTHER): Payer: 59 | Admitting: Psychiatry

## 2019-10-06 ENCOUNTER — Other Ambulatory Visit: Payer: Self-pay

## 2019-10-06 DIAGNOSIS — F411 Generalized anxiety disorder: Secondary | ICD-10-CM

## 2019-10-06 NOTE — Progress Notes (Signed)
[facility] Counselor/Therapist Progress Note  Patient ID: Vanessa Doyle, MRN: [id number],    Date: 10/06/2019  Time Spent: 60 minutes  8:00am to 9:00am  Treatment Type: Individual Therapy  Reported Symptoms: anxiety, some depression (improving)  Mental Status Exam:  Appearance:   Casual     Behavior:  Appropriate and Sharing  Motor:  Normal  Speech/Language:   Normal Rate  Affect:  anxious  Mood:  Stressed, anxious, some depression "but better"    normal  Thought content:    WNL  Sensory/Perceptual disturbances:    WNL  Orientation:  oriented to person, place, time/date, situation, day of week, month of year and year  Attention:  Good  Concentration:  Good  Memory:  WNL  Fund of knowledge:   Good  Insight:    Good  Judgment:   Good  Impulse Control:  Good/Fair   Risk Assessment: Danger to Self:  No Self-injurious Behavior: No Danger to Others: No Duty to Warn:no Physical Aggression / Violence:No  Access to Firearms a concern: No  Gang Involvement:No    Subjective: Patient reports this is "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  Interventions: Cognitive Behavioral Therapy and Solution-Oriented/Positive Psychology  Diagnosis:   ICD-10-CM   1. Generalized anxiety disorder  F41.1      Plan: Patient not signing any updates for tx plan on computer screen due to COVID.We altered her long term and short term goals, and strategies a bit to better assess progress or lack of progress.  Treatment Goals: Some goals may remain on tx plan as patient works on them, however progress will be noted each session in "Progress" sections.  Long term goal: Stabilize anxiety level while increasing ability to function on a daily basis, as Evidenced by patient being  able to function daily without feeling her anxiety level impedes her functioning level.Vanessa Doyle term goal: Increase understanding of beliefs and messages that produce worry and amxiety, as Evidenced by patient clearly stating her improved understanding of negative/anxious thought patterns and how they affect how they lead to worry and anxiety.  Strategies: 1)Help patient develop reality-based, positive cognitive messages. 2)Increase dailyuse of positive self-talk.  Progressing: Patient reports "lot of overthinking, second-guessing herself, which "always leads to more anxious thoughts and negative self-talk." States "I do sometimes catch myself overthinking and try to stop it but that is hard and I know I shouldn't be doing this."  Encouraged her to delete the "I shouldn't be doing this and replace with "this is not helpful", so as to eliminate the self-blame. She gave examples of anxious thoughts relating to "school assignments and whether she'll get them done on time and will she pass".  Asked patient to think back over the past yr and a half and how her promptness in assignments and her past grades have all been very acceptable, hoping that reflecting on this can her realize some of her anxious thoughts are not very realistic nor grounded, and patient seemed to understand "but I'll have to really work on that."  Discussed the importance of follow through in trying different strategies and in thinking in different ways in order to get different results. Working to help her be more realistic in her expectations of herself and in her  view of herself, which she is changing some very gradually, and can benefit by working more intently on this in order to maintain gains better. Keep working to take nighttime meds regularly, decrease overthinking, increase positive self-talk, and self-monitoring anxious thoughts and replace them to be more positive/realtiy-based, and empowering thoughts that do not  support anxiety nor depression.   Goal review and progress/challenges noted with patient.   Next appt within 2 weeks.  Shanon Ace, LCSW

## 2019-10-14 ENCOUNTER — Other Ambulatory Visit: Payer: Self-pay

## 2019-10-14 ENCOUNTER — Ambulatory Visit (INDEPENDENT_AMBULATORY_CARE_PROVIDER_SITE_OTHER): Payer: 59 | Admitting: Psychiatry

## 2019-10-14 DIAGNOSIS — F411 Generalized anxiety disorder: Secondary | ICD-10-CM

## 2019-10-14 NOTE — Progress Notes (Signed)
      [facility] Counselor/Therapist Progress Note  Patient ID: Vanessa Doyle, MRN: [id number],    Date: 10/14/2019  Time Spent: 60 minutes   11:00am to 12:00noon  Treatment Type: Individual Therapy  Reported Symptoms: anxiety, stressed, some depressed  Mental Status Exam:  Appearance:   Casual     Behavior:  Appropriate and Sharing  Motor:  Normal  Speech/Language:   Normal Rate  Affect:  anxious  Mood:  anxious  Thought process:  goal directed  Thought content:    WNL  Sensory/Perceptual disturbances:    WNL  Orientation:  oriented to person, place, time/date, situation, day of week, month of year and year  Attention:  Good  Concentration:  Good  Memory:  WNL  Fund of knowledge:   Good  Insight:    Good  Judgment:   Good  Impulse Control:  Good   Risk Assessment: Danger to Self:  No Self-injurious Behavior: No Danger to Others: No Duty to Warn:no Physical Aggression / Violence:No  Access to Firearms a concern: No  Gang Involvement:No   Subjective:  Patient reports more stress recently but that she felt "rattled by it but I felt that I tried harder."  Recognized some of her gains over past several months, however this past week has been challenging for her.  Interventions: Cognitive Behavioral Therapy and Ego-Supportive  Diagnosis:   ICD-10-CM   1. Generalized anxiety disorder  F41.1     Plan: Patient not signing any updates for tx plan on computer screen due to COVID.We altered her long term and short term goals, and strategies a bit to better assess progress or lack of progress.  Treatment Goals: Some goals may remain on tx plan as patient works on them, however progress will be noted each session in "Progress" sections.  Long term goal: Stabilize anxiety level while increasing ability to function on a daily basis, as Evidenced by patient being able to function daily without feeling her anxiety level impedes her functioning level.[name] term  goal: Increase understanding of beliefs and messages that produce worry and amxiety, as Evidenced by patient clearly stating her improved understanding of negative/anxious thought patterns and how they affect how they lead to worry and anxiety.  Strategies: 1)Help patient develop reality-based, positive cognitive messages. 2)Increase dailyuse of positive self-talk.  Progressing: Patient in today reporting increased anxiety, some more difficulty completing tasks, has had bad migraine during the week. Earlier this week in one of her classes, there was an issue that 'hurt my feelings and had an emotional breakdown and was in tears." Very tearful as she shared this. Processed this in session today and she is still overthinking, overanalyzing, judging herself.  This also heightened her second-guessing and judging of herself which she had been trying to reduce recently. Discussed her need to not stay stuck in her negative thoughts about the school issue and not let it define herself.  States she needs to eat healthier, get more rest, try to let go of negative self-talk, and stop comparing myself to others. Next week is her 19st birthday and she'll be in [place], [place] with parents and looking forward to that.  Was feeling less deflated and more grounded by session end.   Review of goals and progress/challenges noted with patient.  Next visit within 2 weeks.    [name], LCSW

## 2019-10-20 ENCOUNTER — Ambulatory Visit (INDEPENDENT_AMBULATORY_CARE_PROVIDER_SITE_OTHER): Payer: 59 | Admitting: Psychiatry

## 2019-10-20 ENCOUNTER — Other Ambulatory Visit: Payer: Self-pay

## 2019-10-20 DIAGNOSIS — F411 Generalized anxiety disorder: Secondary | ICD-10-CM | POA: Diagnosis not present

## 2019-10-20 NOTE — Progress Notes (Signed)
[facility] Counselor/Therapist Progress Note  Patient ID: Vanessa Doyle, MRN: [id number],    Date: 10/20/2019  Time Spent: 60 minutes  8:00am to 9:00am  Treatment Type: Individual Therapy  Reported Symptoms: anxiety, stressed, depression  Mental Status Exam:  Appearance:   Casual     Behavior:  Appropriate and Sharing  Motor:  Normal  Speech/Language:   Normal Rate  Affect:  anxious, depressed  Mood:  anxious and depressed  Thought process:  goal directed  Thought content:    WNL  Sensory/Perceptual disturbances:    WNL  Orientation:  oriented to person, place, time/date, situation, day of week, month of year and year  Attention:  Good  Concentration:  Good  Memory:  No issues  Fund of knowledge:   Good  Insight:    Good  Judgment:   Good  Impulse Control:  Good   Risk Assessment: Danger to Self:  No Self-injurious Behavior: No Danger to Others: No Duty to Warn:no Physical Aggression / Violence:No  Access to Firearms a concern: No  Gang Involvement:No   Subjective: Patient reports "past week of roller-coaster ups and downs." 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  Interventions: Cognitive Behavioral Therapy and Solution-Oriented/Positive Psychology  Diagnosis:   ICD-10-CM   1. Generalized anxiety disorder  F41.1     Plan: Patient not signing any updates for tx plan on computer screen due to COVID.We altered her long term and short term goals, and strategies a bit to better assess progress or lack of progress.  Treatment Goals: Some goals may remain on tx plan as patient works on them, however progress will be noted each session in "Progress" sections.  Long term goal: Stabilize anxiety level while increasing ability to function on a daily basis, as Evidenced by patient being able to function daily without  feeling her anxiety level impedes her functioning level.[name] term goal: Increase understanding of beliefs and messages that produce worry and amxiety, as Evidenced by patient clearly stating her improved understanding of negative/anxious thought patterns and how they affect how they lead to worry and anxiety.  Strategies: 1)Help patient develop reality-based, positive cognitive messages. 2)Increase dailyuse of positive self-talk.  Progressing: Patient in today and feeling better about some of her schoolwork she was upset about last session. Had misunderstood some of the info she was given and it was obvious she had harshly judged some of her work and ended up getting more positive feedback from Secondary school teacher.  This made it more clear that patient's negative eval of herself is critical and this helped her to see it more clearly so as to not avoid it.  Looked at her critical/anxious thoughts and challenged several of them, focusing on the fact of their not being reality-based, and they lead to and support anxiety and her being self-critical.  Encouraged her to recognize how her critical thinking/messages are affecting how she feels and how she treats herself more negatively.  To keep more "therapy notes" on her phone between sessions and share out in session.  Needs to work decreasing her overthinking and on not assuming the "worst" or what may go wrong versus what may go well.    Goal review and progress noted with patient.  Next appt within 2-3 weeks.   [name], LCSW

## 2019-10-26 ENCOUNTER — Ambulatory Visit: Payer: 59 | Admitting: Psychiatry

## 2019-11-04 ENCOUNTER — Ambulatory Visit (INDEPENDENT_AMBULATORY_CARE_PROVIDER_SITE_OTHER): Payer: 59 | Admitting: Psychiatry

## 2019-11-04 DIAGNOSIS — F411 Generalized anxiety disorder: Secondary | ICD-10-CM

## 2019-11-04 NOTE — Progress Notes (Signed)
[facility] Counselor/Therapist Progress Note  Patient ID: Vanessa Doyle, MRN: [id number],    Date: 11/04/2019  Time Spent: 60 minutes   9:00am to 10:00am  Virtual Visit Note via [place] with patient by a video enabled telemedicine/telehealth application or telephone, with their informed consent, and verified patient privacy and that I am speaking with the correct person using two identifiers. I discussed the limitations, risks, security and privacy concerns of performing psychotherapy and management service by telephone and the availability of in person appointments. I also discussed with the patient that there may be a patient responsible charge related to this service. The patient expressed understanding and agreed to proceed. I discussed the treatment planning with the patient. The patient was provided an opportunity to ask questions and all were answered. The patient agreed with the plan and demonstrated an understanding of the instructions. The patient was advised to call  our office if  symptoms worsen or feel they are in a crisis state and need immediate contact.   Therapist Location: [facility] Patient Location: home   Treatment Type: Individual Therapy  Reported Symptoms: anxiety, stressed  Mental Status Exam:  Appearance:   Casual     Behavior:  Appropriate, Sharing and Motivated  Motor:  Normal  Speech/Language:   Normal Rate  Affect:  anxious  Mood:  anxious and stressed  Thought process:  goal directed  Thought content:    WNL  Sensory/Perceptual disturbances:    WNL  Orientation:  oriented to person, place, time/date, situation, day of week, month of year and year  Attention:  Good  Concentration:  Good  Memory:  WNL  Fund of  knowledge:   Good  Insight:    Good  Judgment:   Good  Impulse Control:  Good   Risk Assessment: Danger to Self:  No Self-injurious Behavior: No Danger to Others: No Duty to Warn:no Physical Aggression /  Violence:No  Access to Firearms a concern: No  Gang Involvement:No   Subjective: 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 More alone time but is doing ok with that.  Interventions: Cognitive Behavioral Therapy and Ego-Supportive  Diagnosis:   ICD-10-CM   1. Generalized anxiety disorder  F41.1      Plan: Patient not signing any updates for tx plan on computer screen due to [place].We altered her long term and short term goals, and strategies a bit to better assess progress or lack of progress.  Treatment Goals: Some goals may remain on tx plan as patient works on them, however progress will be noted each session in "Progress" sections.  Long term goal: Stabilize anxiety level while increasing ability to function on a daily basis, as Evidenced by patient being able to function daily without feeling her anxiety level impedes her functioning level.Sheran Fava term goal: Increase understanding of beliefs and messages that produce worry and amxiety, as Evidenced by patient clearly stating her improved understanding of negative/anxious thought patterns and how they affect how they lead to worry and anxiety.  Strategies: 1)Help patient develop reality-based, positive cognitive messages. 2)Increase dailyuse of positive self-talk.  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  She feels the level of her  anxiety has not increased but has become "more anxious in a broader way and she does feel the internship has impacted this but I'm also trying to do better with it and make it work."  Focused on her strategies cited above, helping her recognize cognitive messages that work against her and developing messages that  are more positive and reality-based that do not support anxiety/negativity.  Still working to reduce critical self-talk and replace with positive, encouraging self-talk.  To focus on this more consistently between sessions and to use visual reminders discussed in session.    Goal review and progress/challenges noted with patient.   Next appt within 2 weeks.  [name], LCSW

## 2019-11-09 ENCOUNTER — Ambulatory Visit (INDEPENDENT_AMBULATORY_CARE_PROVIDER_SITE_OTHER): Payer: 59 | Admitting: Psychiatry

## 2019-11-09 ENCOUNTER — Other Ambulatory Visit: Payer: Self-pay

## 2019-11-09 DIAGNOSIS — F411 Generalized anxiety disorder: Secondary | ICD-10-CM | POA: Diagnosis not present

## 2019-11-09 NOTE — Progress Notes (Signed)
[facility] Counselor/Therapist Progress Note  Patient ID: Vanessa Doyle, MRN: [id number],    Date: 11/09/2019  Time Spent: 58 minutes   9:05 to 10:03am  Treatment Type: Individual Therapy  Reported Symptoms: anxiety, depression  Mental Status Exam:  Appearance:   Casual     Behavior:  Appropriate and Sharing  Motor:  Normal  Speech/Language:   Normal Rate  Affect:  anxious, some depression  Mood:  anxious  Thought process:  normal  Thought content:    WNL  Sensory/Perceptual disturbances:    WNL  Orientation:  oriented to person, place, time/date, situation, day of week, month of year and year  Attention:  Good  Concentration:  Good  Memory:  WNL  Fund of knowledge:   Good  Insight:    Good  Judgment:   Good  Impulse Control:  Good   Risk Assessment: Danger to Self:  No Self-injurious Behavior: No Danger to Others: No Duty to Warn:no Physical Aggression / Violence:No  Access to Firearms a concern: No  Gang Involvement:No   Subjective: Patient in today with anxiety and depression, but also noticing some improvements.  Shared some history that related to academic struggles currently however she is doing better with current academic schedule and trying to have less self-critical attitude.   Interventions: Cognitive Behavioral Therapy and Ego-Supportive  Diagnosis:   ICD-10-CM   1. Generalized anxiety disorder  F41.1     Plan: Patient not signing any updates for tx plan on computer screen due to [place].We altered her long term and short term goals, and strategies a bit to better assess progress or lack of progress.  Treatment Goals: Some goals may remain on tx plan as patient works on them, however progress will be noted each session in "Progress" sections.  Long term goal: Stabilize anxiety level while increasing ability to function on a daily basis, as Evidenced by patient being able to function daily without feeling her anxiety level impedes her  functioning level.Sheran Fava term goal: Increase understanding of beliefs and messages that produce worry and amxiety, as Evidenced by patient clearly stating her improved understanding of negative/anxious thought patterns and how they affect how they lead to worry and anxiety.  Strategies: 1)Help patient develop reality-based, positive cognitive messages. 2)Increase dailyuse of positive self-talk.  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 Depression has decreased some. Anxious about completing currently community college classes and then transferring to [employer] to complete her 4 yr degree.  Lots of anxiety in all that, even though she wants to follow through on this plan.  Patient shares that school has  always be very stressful.  Recalls back to elementary school feeling that she wasn't as good as other students---tried to get into academically gifted program "and failed, they said I was too slow, and those experiences have stuck with me over the years."  Standardized testing was very intimidating and I always felt "less than other students." Admitted more than she has in the past about her own internal negative self-talk has been a big factor for patient. Again, worked on the anxious/critical thoughts that are impacting her the most, identifying them more quickly in order to replace those thoughts with more positive, reality-based, hopeful, and empowering thoughts that do not support anxiety nor negativity, and ultimately her self-talk and ways she treats herself can be much healthier. Did not do her "therapy notes" assignment from last time as "  I just forgot".  She will pick up on that assignment this week and feels it will be helpful.  Finds that a few podcasts are helpful with her mood as they tend to be upbeat.   Goal review and progress noted with patient.  Next appt within 2 weeks.   [name],  LCSW

## 2019-12-07 ENCOUNTER — Encounter (INDEPENDENT_AMBULATORY_CARE_PROVIDER_SITE_OTHER): Payer: Self-pay | Admitting: Family Medicine

## 2019-12-07 ENCOUNTER — Other Ambulatory Visit: Payer: Self-pay

## 2019-12-07 ENCOUNTER — Ambulatory Visit (INDEPENDENT_AMBULATORY_CARE_PROVIDER_SITE_OTHER): Payer: 59 | Admitting: Family Medicine

## 2019-12-07 VITALS — BP 110/58 | HR 82 | Temp 97.8°F | Ht 64.0 in | Wt 229.0 lb

## 2019-12-07 DIAGNOSIS — E559 Vitamin D deficiency, unspecified: Secondary | ICD-10-CM | POA: Diagnosis not present

## 2019-12-07 DIAGNOSIS — Z0289 Encounter for other administrative examinations: Secondary | ICD-10-CM

## 2019-12-07 DIAGNOSIS — Z9189 Other specified personal risk factors, not elsewhere classified: Secondary | ICD-10-CM | POA: Diagnosis not present

## 2019-12-07 DIAGNOSIS — R5383 Other fatigue: Secondary | ICD-10-CM | POA: Diagnosis not present

## 2019-12-07 DIAGNOSIS — Z6839 Body mass index (BMI) 39.0-39.9, adult: Secondary | ICD-10-CM

## 2019-12-07 DIAGNOSIS — R0602 Shortness of breath: Secondary | ICD-10-CM | POA: Diagnosis not present

## 2019-12-07 DIAGNOSIS — F418 Other specified anxiety disorders: Secondary | ICD-10-CM | POA: Diagnosis not present

## 2019-12-07 NOTE — Progress Notes (Signed)
Office: [phone]  /  Fax: [phone]    Date: Dec 21, 2019   Appointment Start Time: 9:01am Duration: 42 minutes Provider: Lawerance Cruel, Psy.D. Type of Session: Intake for Individual Therapy  Location of Patient: Home Location of Provider: Provider's Home Type of Contact: Telepsychological Visit via MyChart Video Visit  Informed Consent: Prior to proceeding with today's appointment, two pieces of identifying information were obtained. In addition, Jaxson's physical location at the time of this appointment was obtained as well a phone number she could be reached at in the event of technical difficulties. Vickye and this provider participated in today's telepsychological service.   The provider's role was explained to Herma Carson. The provider reviewed and discussed issues of confidentiality, privacy, and limits therein (e.g., reporting obligations). In addition to verbal informed consent, written informed consent for psychological services was obtained prior to the initial appointment. Since the clinic is not a 24/7 crisis center, mental health emergency resources were shared and this  provider explained MyChart, e-mail, voicemail, and/or other messaging systems should be utilized only for non-emergency reasons. This provider also explained that information obtained during appointments will be placed in Clark's medical record and relevant information will be shared with other providers at [facility] for coordination of care. Moreover, Kaneesha agreed information may be shared with other [facility] providers as needed for coordination of care. By signing the service agreement document, Basma provided written consent for coordination of care. Prior to initiating telepsychological services, Jeni completed an informed consent document, which included the development of a safety plan (i.e., an emergency contact, nearest emergency room, and emergency resources) in the  event of an emergency/crisis. Collene expressed understanding of the rationale of the safety plan. Lavana verbally acknowledged understanding she is ultimately responsible for understanding her insurance benefits for telepsychological and in-person services. This provider also reviewed confidentiality, as it relates to telepsychological services, as well as the rationale for telepsychological services (i.e., to reduce exposure risk to COVID-19). Evlyn  acknowledged understanding that appointments cannot be recorded without both party consent and she is aware she is responsible for securing confidentiality on her end of the session. Philomene verbally consented to proceed.  Chief Complaint/HPI: Lorrine was referred by Dr. Reuben Likes due to depression with anxiety. Per the note for the initial visit with Dr. Reuben Likes on December 07, 2019, "Memory is struggling with emotional eating and using food for comfort to the extent that it is negatively impacting her health. She has been working on behavior modification techniques to help reduce her emotional eating and has been somewhat successful. She shows no sign of suicidal or homicidal ideations. Anxiety is reasonably well controlled and Chelsey voices her anxiety has come a long way. She sees [name] for therapy and [name] for medication management." The note for the initial appointment with Dr. Reuben Likes  indicated the following: "Leeloo's habits were reviewed today and are as follows: Her family eats meals together, she thinks her family will eat healthier with her, her desired weight loss is 89 lbs, she has been heavy most of her life, she started gaining weight over the past year and a half, her heaviest weight ever was 235 pounds, she craves sweets and greasy foods, she skips lunch mostly and also dinner, she frequently eats larger portions than normal, she has binge eating behaviors and she struggles with emotional eating." Jaianna's Food and Mood  (modified PHQ-9) score on December 07, 2019 was 15.  During today's appointment, Tamzin was verbally administered a questionnaire assessing various behaviors related to emotional eating. Leiann endorsed the following: eat certain foods when you are anxious, stressed, depressed, or your feelings are hurt, use food to help you cope with emotional situations, find food is comforting to you, overeat when you are worried about something, overeat frequently when you are bored or lonely, not worry about what you eat when you are in a good mood, overeat when you are alone, but eat much less when you are with other people and eat as a reward. Jaelin believes the onset of emotional eating was likely in childhood (around age 30) and she described the current frequency of emotional eating as "once a week." In addition, Matie endorsed a history of binge eating. She explained around August 2020 she was unable to eat/drink while at school due to the equipment in the building and did not have access to the cafeteria due to COVID-19 resulting in her eating large portions after the school day. She described the frequency of the aforementioned as "once every other week," noting the frequency has reduced. Asmara denied a history of restricting food intake, purging and engagement in other compensatory strategies, and has never been diagnosed with an eating disorder. She also denied a history of treatment for emotional eating. Moreover, Zarai indicated stress with school triggers emotional eating. She is unsure what makes emotional eating better. Furthermore, Lacee denied other problems of concern.    Mental Status Examination:  Appearance: well groomed and appropriate hygiene  Behavior: appropriate to circumstances Mood: euthymic Affect: mood congruent Speech: normal in rate, volume, and tone Eye Contact: appropriate Psychomotor Activity: appropriate Gait: unable to assess Thought Process: linear, logical, and goal directed    Thought Content/Perception: denies suicidal and homicidal ideation, plan, and intent and no hallucinations, delusions, bizarre thinking or behavior reported or observed Orientation: time, person, place and purpose of appointment Memory/Concentration: memory, attention, language, and fund of knowledge intact  Insight/Judgment: good  Family & Psychosocial History: Shalane reported she is not a relationship and she does not have any children. She indicated she is currently enrolled in a photography program, adding she will be attending [employer] this fall. Currently, Ann's social support system consists of her mother, father, brother, brother's girlfriend, and friends at school. Moreover, Zoriana stated she resides with her parents.   Medical History:  Past Medical History:  Diagnosis Date  . Anxiety   . Depression   . Episodic tension type headache 11/24/2014  . Insomnia   . Migraine without aura and without status migrainosus, not intractable 11/24/2014  . Migraine without aura and without status migrainosus, not intractable   . Vitamin D deficiency    Past Surgical History:  Procedure Laterality Date  . WISDOM TOOTH EXTRACTION     Current Outpatient Medications on File Prior to Visit  Medication Sig Dispense Refill  . acetaminophen (TYLENOL) 325 MG tablet Take 2 tablets (650 mg total) by mouth every 6 (six) hours as needed for fever or mild pain.    . busPIRone (BUSPAR) 5 MG tablet Take by mouth 1 tablet total 5 mg after breakfast and half tablet total 2.5 mg after lunch 60 tablet 0  . ibuprofen (ADVIL) 200 MG tablet Take 200 mg by mouth every 6 (six) hours as needed.    . temazepam (RESTORIL) 7.5 MG capsule Take 1 capsule (7.5 mg total) by mouth at bedtime as needed. for sleep 30 capsule 5  . Vitamin D, Ergocalciferol, (DRISDOL)  1.25 MG (50000 UNIT) CAPS capsule Take 1 capsule (50,000 Units total) by mouth every 7 (seven) days. 4 capsule 0   No current facility-administered  medications on file prior to visit.  Jaelle denied a history of head injuries and loss of consciousness.   Mental Health History: Emely reported she attended therapeutic services in 8th grade to address depression and anxiety. She stated she currently meets with [name], LCSW with [facility] to address depression and anxiety, noting they meet weekly. Joselynne noted she would inform Ms. Crawford Givens she is meeting with this provider and agreed to sign an authorization for coordination of care. She indicated their next appointment is on Dec 26, 2019. She stated she meets with her current psychiatrist, Beverly Milch, MD, for medication management. Dr. Marlyne Beards currently prescribes Buspar and Restoril. She denied any current concerns with her current medications. Jace reported there is no history of hospitalizations for psychiatric concerns. Jonie endorsed a family history of mental health related concerns, but indicated she does not know details. Yexalen reported there is no history of trauma including psychological, physical  and sexual abuse, as well as neglect.  Deriona described her typical mood lately as "very tired," "stressed," and "a little anxious." Aside from concerns noted above and endorsed on the PHQ-9 and GAD-7, Annmarie reported experiencing panic attacks when stressed, noting they have decreased in frequency recently. She also discussed experiencing worry thoughts about "a little bit of everything," but mostly "not living up to expectations." Margeart endorsed current alcohol use, noting she may have a glass of wine once a month. She denied tobacco use. She denied illicit/recreational substance use. Regarding caffeine intake, Kennesha reported consuming 2-3 cups of coffee daily. Furthermore, Vaeda indicated she is not experiencing the following: hallucinations and delusions, paranoia, symptoms of mania , social withdrawal, crying spells and decreased motivation. She also denied history of and current  suicidal ideation, plan, and intent; history of and current homicidal ideation, plan, and intent; and history of and current engagement in self-harm.  The following strengths were reported by [name]: self-aware, dedicated, and responsible. The following strengths were observed by this provider: ability to express thoughts and feelings during the therapeutic session, ability to establish and benefit from a therapeutic relationship, willingness to work toward established goal(s) with the clinic and ability to engage in reciprocal conversation.  Legal History: Kharlie reported there is no history of legal involvement.   Structured Assessments Results: The Patient Health Questionnaire-9 (PHQ-9) is a self-report measure that assesses symptoms and severity of depression over the course of the last two weeks. Kiylee obtained a score of 6 suggesting mild depression. Basia finds the endorsed symptoms to be not difficult at all. [0= Not at all; 1= Several days; 2= More than half the days; 3= Nearly every day] Little interest or pleasure in doing things 0  Feeling down, depressed, or hopeless 0  Trouble falling or staying asleep, or sleeping too much 2  Feeling tired or having little energy 2  Poor appetite or overeating 1  Feeling bad about yourself --- or that you are a failure or have let yourself or your family down 1  Trouble concentrating on things, such as reading the newspaper or watching television 0  Moving or speaking so slowly that other people could have noticed? Or the opposite --- being so fidgety or restless that you have been moving around a lot more than usual 0  Thoughts that you would be better off dead or hurting yourself in some  way 0  PHQ-9 Score 6    The Generalized Anxiety Disorder-7 (GAD-7) is a brief self-report measure that assesses symptoms of anxiety over the course of the last two weeks. Darely obtained a score of 9 suggesting mild anxiety. Nery finds the endorsed symptoms to be  somewhat difficult. [0= Not at all; 1= Several days; 2= Over half the days; 3= Nearly every day] Feeling nervous, anxious, on edge 1  Not being able to stop or control worrying 2  Worrying too much about different things 2  Trouble relaxing 3  Being so restless that it's hard to sit still 0  Becoming easily annoyed or irritable 1  Feeling afraid as if something awful might happen 0  GAD-7 Score 9   Interventions:  Conducted a chart review Focused on rapport building Verbally administered PHQ-9 and GAD-7 for symptom monitoring Verbally administered Food & Mood questionnaire to assess various behaviors related to emotional eating Provided emphatic reflections and validation Collaborated with patient on a treatment goal  Psychoeducation provided regarding physical versus emotional hunger  Provisional DSM-5 Diagnosis(es): 300.02 (F41.1) Generalized Anxiety Disorder  Plan: Bella appears able and willing to participate as evidenced by collaboration on a treatment goal, engagement in reciprocal conversation, and asking questions as needed for clarification. The next appointment will be scheduled in approximately two weeks, which will be via MyChart Video Visit. The following treatment goal was established: increase coping skills. This provider will regularly review the treatment plan and medical chart to keep informed of status changes. Jannatul expressed understanding and agreement with the initial treatment plan of care.  Sanaii will be sent a handout via e-mail to utilize between now and the next appointment to increase awareness of hunger patterns and subsequent eating. Eliz provided verbal consent during today's appointment for this provider to send the handout via e-mail.

## 2019-12-07 NOTE — Progress Notes (Signed)
Chief Complaint:   OBESITY Vanessa Doyle (MR# [id number]) is a 19 y.o. female who presents for evaluation and treatment of obesity and related comorbidities. Current BMI is Body mass index is 39.31 kg/m.Marland Kitchen Vanessa Doyle has been struggling with her weight for many years and has been unsuccessful in either losing weight, maintaining weight loss, or reaching her healthy weight goal.  Vanessa Doyle is currently in the action stage of change and ready to dedicate time achieving and maintaining a healthier weight. Vanessa Doyle is interested in becoming our patient and working on intensive lifestyle modifications including (but not limited to) diet and exercise for weight loss.  Vanessa Doyle reports an Therapist, art led her to our clinic. She is taking Hormonal Balance (115 calories with 15 grams of protein).  Breakfast is a protein shake or leftover meal prep (celery, carrots, 1/4 cup peanut butter, 1/4 cup trail mix, 6 slices of cheese and banana, 18 chips (feels full); ~12:00/1:00 p.m. if she eats lunch will eat meal prep as stated above (occasionally hungry but feels full after); Dinner is 2.5 oz chicken and 3/4 cup vegetables (feels satisfied); denies eating after dinner.  Vanessa Doyle's habits were reviewed today and are as follows: Her family eats meals together, she thinks her family will eat healthier with her, her desired weight loss is 89 lbs, she has been heavy most of her life, she started gaining weight over the past year and a half, her heaviest weight ever was 235 pounds, she craves sweets and greasy foods, she skips lunch mostly and also dinner, she frequently eats larger portions than normal, she has binge eating behaviors and she struggles with emotional eating.  Depression Screen Vanessa Doyle's Food and Mood (modified PHQ-9) score was 15.  Depression screen [name] 2/9 12/07/2019  Decreased Interest 2  Down, Depressed, Hopeless 3  PHQ - 2 Score 5  Altered sleeping 2  Tired, decreased energy 3  Change in appetite 2    Feeling bad or failure about yourself  3  Trouble concentrating 0  Moving slowly or fidgety/restless 0  Suicidal thoughts 0  PHQ-9 Score 15  Difficult doing work/chores Somewhat difficult   Subjective:   Other fatigue. Vanessa Doyle denies daytime somnolence and admits to waking up still tired. Vanessa Doyle generally gets 4 hours of sleep per night without medication and 8 to 9 hours with medication, and states that she does not sleep well most nights unless she takes medication. Apneic episodes are not present. Epworth Sleepiness Score is 3.    Shortness of breath on exertion. Vanessa Doyle notes increasing shortness of breath with exercising and seems to be worsening over time with weight gain. She notes getting out of breath sooner with activity than she used to. This has gotten worse recently. Vanessa Doyle denies shortness of breath at rest or orthopnea.  Depression with anxiety. Vanessa Doyle is struggling with emotional eating and using food for comfort to the extent that it is negatively impacting her health. She has been working on behavior modification techniques to help reduce her emotional eating and has been somewhat successful. She shows no sign of suicidal or homicidal ideations. Anxiety is reasonably well controlled and Vanessa Doyle voices her anxiety has come a long way. She sees [name] for therapy and [name] for medication management.  Vitamin D deficiency. No nausea, vomiting, or muscle weakness. Vanessa Doyle endorses fatigue. She is on OTC Vitamin D daily.     At risk for osteoporosis. Vanessa Doyle is at higher risk of osteopenia and osteoporosis due to Vitamin  D deficiency.   Assessment/Plan:   Other fatigue. Vanessa Doyle does feel that her weight is causing her energy to be lower than it should be. Fatigue may be related to obesity, depression or many other causes. Labs will be ordered, and in the meanwhile, Vanessa Doyle will focus on self care including making healthy food choices, increasing physical activity and focusing on stress  reduction. EKG 12-Lead, Vitamin B12, CBC with Differential/Platelet, Comprehensive metabolic panel, Folate, Hemoglobin A1c, Insulin, random, T3, T4, free, TSH testing ordered today.  Shortness of breath on exertion. Vanessa Doyle does feel that she gets out of breath more easily that she used to when she exercises. [name]'s shortness of breath appears to be obesity related and exercise induced. She has agreed to work on weight loss and gradually increase exercise to treat her exercise induced shortness of breath. Will continue to monitor closely. Lipid Panel With LDL/HDL Ratio ordered today.  Depression with anxiety. Behavior modification techniques were discussed today to help Vanessa Doyle deal with her emotional/non-hunger eating behaviors.  Orders and follow up as documented in patient record. Vanessa Doyle is to follow-up with [name] for further medication management. Will refer to Dr. Dewaine Conger, our bariatric psychologist, for evaluation.  Vitamin D deficiency. Low Vitamin D level contributes to fatigue and are associated with obesity, breast, and colon cancer. VITAMIN D 25 Hydroxy (Vit-D Deficiency, Fractures) level ordered today.  At risk for osteoporosis. Vanessa Doyle was given approximately 15 minutes of osteoporosis prevention counseling today. Vanessa Doyle is at risk for osteopenia and osteoporosis due to her Vitamin D deficiency. She was encouraged to take her Vitamin D and follow her higher calcium diet and increase strengthening exercise to help strengthen her bones and decrease her risk of osteopenia and osteoporosis.  Repetitive spaced learning was employed today to elicit superior memory formation and behavioral change.  Class 2 severe obesity with serious comorbidity and body mass index (BMI) of 39.0 to 39.9 in adult, unspecified obesity type (HCC).  Vanessa Doyle is currently in the action stage of change and her goal is to continue with weight loss efforts. I recommend Vanessa Doyle begin the structured treatment plan as  follows:  She has agreed to the Category 2 Plan.  Exercise goals: No exercise has been prescribed at this time.   Behavioral modification strategies: increasing lean protein intake, increasing vegetables, meal planning and cooking strategies, keeping healthy foods in the home and planning for success.  She was informed of the importance of frequent follow-up visits to maximize her success with intensive lifestyle modifications for her multiple health conditions. She was informed we would discuss her lab results at her next visit unless there is a critical issue that needs to be addressed sooner. Vanessa Doyle agreed to keep her next visit at the agreed upon time to discuss these results.  Objective:   Blood pressure (!) 110/58, pulse 82, temperature 97.8 F (36.6 C), temperature source Oral, height 5\' 4"  (1.626 m), weight 229 lb (103.9 kg), last menstrual period 11/24/2019, SpO2 96 %. Body mass index is 39.31 kg/m.  EKG: Normal sinus rhythm, rate 83 BPM. Within normal limits.  Indirect Calorimeter completed today shows a VO2 of 153 and a REE of 1065.  Her calculated basal metabolic rate is 11/26/2019 thus her basal metabolic rate is worse than expected.  General: Cooperative, alert, well developed, in no acute distress. HEENT: Conjunctivae and lids unremarkable. Cardiovascular: Regular rhythm.  Lungs: Normal work of breathing. Neurologic: No focal deficits.   Lab Results  Component Value Date   CREATININE 0.56  04/14/2018   BUN 10 04/14/2018   NA 140 04/14/2018   K 3.7 04/14/2018   CL 107 04/14/2018   CO2 22 04/14/2018   Lab Results  Component Value Date   ALT 17 04/13/2018   AST 15 04/13/2018   ALKPHOS 73 04/13/2018   BILITOT 0.4 04/13/2018   No results found for: HGBA1C No results found for: INSULIN No results found for: TSH No results found for: CHOL, HDL, LDLCALC, LDLDIRECT, TRIG, CHOLHDL Lab Results  Component Value Date   WBC 7.1 04/13/2018   HGB 12.6 04/13/2018   HCT 37.4  04/13/2018   MCV 84.2 04/13/2018   PLT 305 04/13/2018   No results found for: IRON, TIBC, FERRITIN  Attestation Statements:   This is the patient's first visit at [facility]. The patient's NEW PATIENT PACKET was reviewed at length. Included in the packet: current and past health history, medications, allergies, ROS, gynecologic history (women only), surgical history, family history, social history, weight history, weight loss surgery history (for those that have had weight loss surgery), nutritional evaluation, mood and food questionnaire, PHQ9, Epworth questionnaire, sleep habits questionnaire, patient life and health improvement goals questionnaire. These will all be scanned into the patient's chart under media.   During the visit, I independently reviewed the patient's EKG, bioimpedance scale results, and indirect calorimeter results. I used this information to tailor a meal plan for the patient that will help her to lose weight and will improve her obesity-related conditions going forward. I performed a medically necessary appropriate examination and/or evaluation. I discussed the assessment and treatment plan with the patient. The patient was provided an opportunity to ask questions and all were answered. The patient agreed with the plan and demonstrated an understanding of the instructions. Labs were ordered at this visit and will be reviewed at the next visit unless more critical results need to be addressed immediately.Clinical information was updated and documented in the EMR.   Time spent on visit including pre-visit chart review and post-visit care was 45 minutes.   A separate 15 minutes was spent on risk counseling (see above).    I, Michaelene Song, am acting as transcriptionist for Coralie Common, MD   I have reviewed the above documentation for accuracy and completeness, and I agree with the above. - Ilene Qua, MD

## 2019-12-08 LAB — COMPREHENSIVE METABOLIC PANEL
ALT: 66 IU/L — ABNORMAL HIGH (ref 0–32)
AST: 24 IU/L (ref 0–40)
Albumin/Globulin Ratio: 1.7 (ref 1.2–2.2)
Albumin: 4.7 g/dL (ref 3.9–5.0)
Alkaline Phosphatase: 95 IU/L (ref 39–117)
BUN/Creatinine Ratio: 13 (ref 9–23)
BUN: 9 mg/dL (ref 6–20)
Bilirubin Total: 0.3 mg/dL (ref 0.0–1.2)
CO2: 21 mmol/L (ref 20–29)
Calcium: 9.3 mg/dL (ref 8.7–10.2)
Chloride: 102 mmol/L (ref 96–106)
Creatinine, Ser: 0.72 mg/dL (ref 0.57–1.00)
GFR calc Af Amer: 138 mL/min/{1.73_m2} (ref >59)
GFR calc non Af Amer: 120 mL/min/{1.73_m2} (ref >59)
Globulin, Total: 2.7 g/dL (ref 1.5–4.5)
Glucose: 90 mg/dL (ref 65–99)
Potassium: 4.2 mmol/L (ref 3.5–5.2)
Sodium: 139 mmol/L (ref 134–144)
Total Protein: 7.4 g/dL (ref 6.0–8.5)

## 2019-12-08 LAB — CBC WITH DIFFERENTIAL/PLATELET
Basophils Absolute: 0 10*3/uL (ref 0.0–0.2)
Basos: 0 %
EOS (ABSOLUTE): 0.2 10*3/uL (ref 0.0–0.4)
Eos: 3 %
Hematocrit: 39.8 % (ref 34.0–46.6)
Hemoglobin: 13 g/dL (ref 11.1–15.9)
Immature Grans (Abs): 0 10*3/uL (ref 0.0–0.1)
Immature Granulocytes: 0 %
Lymphocytes Absolute: 2.3 10*3/uL (ref 0.7–3.1)
Lymphs: 41 %
MCH: 28.1 pg (ref 26.6–33.0)
MCHC: 32.7 g/dL (ref 31.5–35.7)
MCV: 86 fL (ref 79–97)
Monocytes Absolute: 0.5 10*3/uL (ref 0.1–0.9)
Monocytes: 8 %
Neutrophils Absolute: 2.7 10*3/uL (ref 1.4–7.0)
Neutrophils: 48 %
Platelets: 285 10*3/uL (ref 150–450)
RBC: 4.63 x10E6/uL (ref 3.77–5.28)
RDW: 13.1 % (ref 11.7–15.4)
WBC: 5.7 10*3/uL (ref 3.4–10.8)

## 2019-12-08 LAB — FOLATE: Folate: 17.7 ng/mL (ref >3.0)

## 2019-12-08 LAB — HEMOGLOBIN A1C
Est. average glucose Bld gHb Est-mCnc: 114 mg/dL
Hgb A1c MFr Bld: 5.6 % (ref 4.8–5.6)

## 2019-12-08 LAB — LIPID PANEL WITH LDL/HDL RATIO
Cholesterol, Total: 136 mg/dL (ref 100–199)
HDL: 55 mg/dL (ref >39)
LDL Chol Calc (NIH): 67 mg/dL (ref 0–99)
LDL/HDL Ratio: 1.2 ratio (ref 0.0–3.2)
Triglycerides: 67 mg/dL (ref 0–149)
VLDL Cholesterol Cal: 14 mg/dL (ref 5–40)

## 2019-12-08 LAB — T3: T3, Total: 125 ng/dL (ref 71–180)

## 2019-12-08 LAB — VITAMIN D 25 HYDROXY (VIT D DEFICIENCY, FRACTURES): Vit D, 25-Hydroxy: 30.5 ng/mL (ref 30.0–100.0)

## 2019-12-08 LAB — T4, FREE: Free T4: 1.19 ng/dL (ref 0.82–1.77)

## 2019-12-08 LAB — VITAMIN B12: Vitamin B-12: 609 pg/mL (ref 232–1245)

## 2019-12-08 LAB — INSULIN, RANDOM: INSULIN: 14 u[IU]/mL (ref 2.6–24.9)

## 2019-12-08 LAB — TSH: TSH: 0.788 u[IU]/mL (ref 0.450–4.500)

## 2019-12-12 ENCOUNTER — Other Ambulatory Visit: Payer: Self-pay

## 2019-12-12 ENCOUNTER — Ambulatory Visit (INDEPENDENT_AMBULATORY_CARE_PROVIDER_SITE_OTHER): Payer: 59 | Admitting: Psychiatry

## 2019-12-12 DIAGNOSIS — F411 Generalized anxiety disorder: Secondary | ICD-10-CM

## 2019-12-12 NOTE — Progress Notes (Signed)
[facility] Counselor/Therapist Progress Note  Patient ID: JUAQUINA MACHNIK, MRN: [id number],    Date: 12/12/2019  Time Spent: 60 minutes   8:00am to 9:00am  Treatment Type: Individual Therapy  Reported Symptoms: anxiety, stressed, depression (improving) Mental Status Exam:  Appearance:   Casual     Behavior:  Appropriate and Sharing  Motor:  Normal  Speech/Language:   Normal Rate  Affect:  anxious  Mood:  anxious and some depression  Thought process:  goal directed  Thought content:    WNL  Sensory/Perceptual disturbances:    WNL  Orientation:  oriented to person, place, time/date, situation, day of week, month of year and year  Attention:  Good  Concentration:  Good  Memory:  WNL  Fund of knowledge:   Good  Insight:    Good  Judgment:   Good  Impulse Control:  Good   Risk Assessment: Danger to Self:  No Self-injurious Behavior: No Danger to Others: No Duty to Warn:no Physical Aggression / Violence:No  Access to Firearms a concern: No  Gang Involvement:No   Subjective: Patient in today reporting anxiety, stressed (school and internship in [place]). Still having some depression but it's lessening.  Affect more bright today and able to be more balanced in her outlook.  Interventions: Cognitive Behavioral Therapy and Solution-Oriented/Positive Psychology  Diagnosis:   ICD-10-CM   1. Generalized anxiety disorder  F41.1     Plan: Patient not signing any updates for tx plan on computer screen due to COVID.We altered her long term and short term goals, and strategies a bit to better assess progress or lack of progress.  Treatment Goals: Some goals may remain on tx plan as patient works on them, however progress will be noted each session in "Progress" sections.  Long term goal: Stabilize anxiety level while increasing ability to function on a daily basis, as Evidenced by patient being able to function daily without feeling her anxiety level impedes her  functioning level.[name] term goal: Increase understanding of beliefs and messages that produce worry and amxiety, as Evidenced by patient clearly stating her improved understanding of negative/anxious thought patterns and how they affect how they lead to worry and anxiety.  Strategies: 1)Help patient develop reality-based, positive cognitive messages. 2)Increase dailyuse of positive self-talk.  Progressing: Patient in today with anxiety, feeling stressed mostly with school and current internship, and depressed although it is decreasing. 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 Anxiety building some re: beginning next phase of her college education at [employer].  It to move there August 5th. Is anxious but is also more focused at times today than usual. Less intimidated by difficult circumstances and some better at seeing some options versus just the problems that arise in situations. Some calmer as she speaks today, even when talking about stressful circumstances. Followed up on info from last session and how she has struggled through the years with low self-esteem and "feeling less than others".  Shares it's not as bad as when she was in [employer] school and feels she still struggles with these issues but it tends to be "more currently focused."  Anxious, but more balanced and less disruptive as she continues to try to acknowledge and manage anxiety.  Some strategies have  helped her with the anxiety including walking, deep breathing exercises, being outside more, trying to think more about what might go well versus not going well, staying more in the present, and Positive Self-Talk.  Goal review and progress noted with patient.  Next appt within 2 weeks.   Shanon Ace,  LCSW

## 2019-12-19 ENCOUNTER — Ambulatory Visit: Payer: 59 | Admitting: Psychiatry

## 2019-12-21 ENCOUNTER — Ambulatory Visit (INDEPENDENT_AMBULATORY_CARE_PROVIDER_SITE_OTHER): Payer: 59 | Admitting: Family Medicine

## 2019-12-21 ENCOUNTER — Other Ambulatory Visit: Payer: Self-pay

## 2019-12-21 ENCOUNTER — Telehealth (INDEPENDENT_AMBULATORY_CARE_PROVIDER_SITE_OTHER): Payer: 59 | Admitting: Psychology

## 2019-12-21 ENCOUNTER — Encounter (INDEPENDENT_AMBULATORY_CARE_PROVIDER_SITE_OTHER): Payer: Self-pay | Admitting: Family Medicine

## 2019-12-21 VITALS — BP 114/73 | HR 77 | Temp 97.9°F | Ht 64.0 in | Wt 227.0 lb

## 2019-12-21 DIAGNOSIS — F411 Generalized anxiety disorder: Secondary | ICD-10-CM

## 2019-12-21 DIAGNOSIS — R7401 Elevation of levels of liver transaminase levels: Secondary | ICD-10-CM

## 2019-12-21 DIAGNOSIS — Z6839 Body mass index (BMI) 39.0-39.9, adult: Secondary | ICD-10-CM

## 2019-12-21 DIAGNOSIS — E559 Vitamin D deficiency, unspecified: Secondary | ICD-10-CM

## 2019-12-21 DIAGNOSIS — E88819 Insulin resistance, unspecified: Secondary | ICD-10-CM

## 2019-12-21 DIAGNOSIS — Z9189 Other specified personal risk factors, not elsewhere classified: Secondary | ICD-10-CM

## 2019-12-21 DIAGNOSIS — E8881 Metabolic syndrome: Secondary | ICD-10-CM

## 2019-12-21 MED ORDER — VITAMIN D (ERGOCALCIFEROL) 1.25 MG (50000 UNIT) PO CAPS: 50000.0000 [IU] | ORAL_CAPSULE | ORAL | 0 refills | Status: DC

## 2019-12-21 NOTE — Progress Notes (Signed)
Chief Complaint:   OBESITY Vanessa Doyle is here to discuss her progress with her obesity treatment plan along with follow-up of her obesity related diagnoses. Vanessa Doyle is on the Category 2 Plan and states she is following her eating plan approximately 50% of the time. Vanessa Doyle states she is walking 45 minutes 2 times per week and doing yoga 30 minutes 2 times per week.  Today's visit was #: 2 Starting weight: 229 lbs Starting date: 12/07/2019 Today's weight: 227 lbs Today's date: 12/21/2019 Total lbs lost to date: 2 Total lbs lost since last in-office visit: 2  Interim History: For the first week Vanessa Doyle had already meal prepped for a former meal plan and the second week she did our meal plan. Breakfast was mostly a yogurt option; Lunch was a wrap with cooked chicken and an apple; Dinner was [place] patties and vegetables. She reports feeling full with no hunger. Snacks were cottage cheese and beef jerky sticks.  Subjective:   Vitamin D deficiency. Vanessa Doyle is not on Vitamin D supplementation. She endorses fatigue. Last Vitamin D 30.5 on 12/07/2019.  Insulin resistance. Vanessa Doyle has a diagnosis of insulin resistance based on her elevated fasting insulin level >5. She continues to work on diet and exercise to decrease her risk of diabetes. She is not on metformin.  Lab Results  Component Value Date   INSULIN 14.0 12/07/2019   Lab Results  Component Value Date   HGBA1C 5.6 12/07/2019   Transaminitis. ALT 66 on 12/07/2019 with an AST of 24 and alkaline phosphatase of 95. These values have not previously been elevated.   At risk for osteoporosis. Vanessa Doyle is at higher risk of osteopenia and osteoporosis due to Vitamin D deficiency.   Assessment/Plan:   Vitamin D deficiency. Low Vitamin D level contributes to fatigue and are associated with obesity, breast, and colon cancer. She was given a prescription for Vitamin D, Ergocalciferol, (DRISDOL) 1.25 MG (50000 UNIT) CAPS capsule every week #4  with 0 refills and will follow-up for routine testing of Vitamin D, at least 2-3 times per year to avoid over-replacement.    Insulin resistance. Vanessa Doyle will continue to work on weight loss, exercise, and decreasing simple carbohydrates to help decrease the risk of diabetes. Vanessa Doyle agreed to follow-up with [place] as directed to closely monitor her progress. Will repeat labs in 3 months; if weight loss stalls or cravings increase, will start metformin.  Transaminitis. Will repeat CMP in 3 months.  At risk for osteoporosis. Vanessa Doyle was given approximately 30 minutes of osteoporosis prevention counseling today. Vanessa Doyle is at risk for osteopenia and osteoporosis due to her Vitamin D deficiency. She was encouraged to take her Vitamin D and follow her higher calcium diet and increase strengthening exercise to help strengthen her bones and decrease her risk of osteopenia and osteoporosis.  Repetitive spaced learning was employed today to elicit superior memory formation and behavioral change.  Class 2 severe obesity with serious comorbidity and body mass index (BMI) of 39.0 to 39.9 in adult, unspecified obesity type (HCC).  Vanessa Doyle is currently in the action stage of change. As such, her goal is to continue with weight loss efforts. She has agreed to the Category 2 Plan.   Exercise goals: No exercise has been prescribed at this time.  Behavioral modification strategies: increasing lean protein intake, meal planning and cooking strategies, keeping healthy foods in the home and planning for success.  Vanessa Doyle has agreed to follow-up with our clinic in 2 weeks. She was  informed of the importance of frequent follow-up visits to maximize her success with intensive lifestyle modifications for her multiple health conditions.   Objective:   Blood pressure 114/73, pulse 77, temperature 97.9 F (36.6 C), temperature source Oral, height 5\' 4"  (1.626 m), weight 227 lb (103 kg), last menstrual period 11/24/2019, SpO2 97  %. Body mass index is 38.96 kg/m.  General: Cooperative, alert, well developed, in no acute distress. HEENT: Conjunctivae and lids unremarkable. Cardiovascular: Regular rhythm.  Lungs: Normal work of breathing. Neurologic: No focal deficits.   Lab Results  Component Value Date   CREATININE 0.72 12/07/2019   BUN 9 12/07/2019   NA 139 12/07/2019   K 4.2 12/07/2019   CL 102 12/07/2019   CO2 21 12/07/2019   Lab Results  Component Value Date   ALT 66 (H) 12/07/2019   AST 24 12/07/2019   ALKPHOS 95 12/07/2019   BILITOT 0.3 12/07/2019   Lab Results  Component Value Date   HGBA1C 5.6 12/07/2019   Lab Results  Component Value Date   INSULIN 14.0 12/07/2019   Lab Results  Component Value Date   TSH 0.788 12/07/2019   Lab Results  Component Value Date   CHOL 136 12/07/2019   HDL 55 12/07/2019   LDLCALC 67 12/07/2019   TRIG 67 12/07/2019   Lab Results  Component Value Date   WBC 5.7 12/07/2019   HGB 13.0 12/07/2019   HCT 39.8 12/07/2019   MCV 86 12/07/2019   PLT 285 12/07/2019   No results found for: IRON, TIBC, FERRITIN  Attestation Statements:   Reviewed by clinician on day of visit: allergies, medications, problem list, medical history, surgical history, family history, social history, and previous encounter notes.  I, Michaelene Song, am acting as transcriptionist for Coralie Common, MD   I have reviewed the above documentation for accuracy and completeness, and I agree with the above. - Jinny Blossom, MD

## 2019-12-22 NOTE — Progress Notes (Signed)
  Office: [phone]  /  Fax: [phone]    Date: Jan 05, 2020   Appointment Start Time: 7:54am Duration: 30 minutes Provider: Lawerance Cruel, Psy.D. Type of Session: Individual Therapy  Location of Patient: Home Location of Provider: Provider's Home Type of Contact: Telepsychological Visit via MyChart Video Visit  Session Content: Vanessa Doyle is a 19 y.o. female presenting via MyChart Video Visit for a follow-up appointment to address the previously established treatment goal of increasing coping skills. Today's appointment was a telepsychological visit due to COVID-19. Vanessa Doyle provided verbal consent for today's telepsychological appointment and she is aware she is responsible for securing confidentiality on her end of the session. Prior to proceeding with today's appointment, Vanessa Doyle's physical location at the time of this appointment was obtained as well a phone number she could be reached at in the event of technical difficulties. Vanessa Doyle and this provider participated in today's telepsychological service.   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 Regarding eating, she shared, "It's been going pretty good;" however, she acknowledged she is forgetting to eat at times and is experiencing difficulty eating larger meals as she would previously snack throughout the day. It was recommended she set alarms on her phone for reminders to eat and eat smaller meals. Moreover, psychoeducation regarding triggers for emotional eating was provided. Vanessa Doyle was provided a handout, and encouraged to utilize the handout between now and the next appointment to increase awareness of triggers and frequency. Vanessa Doyle agreed. This provider also discussed behavioral strategies for specific triggers, such as placing the utensil down when conversing to avoid mindless eating. Vanessa Doyle provided verbal consent during today's appointment for this provider to send a handout about  triggers via e-mail. Vanessa Doyle was receptive to today's appointment as evidenced by openness to sharing, responsiveness to feedback, and willingness to explore triggers for emotional eating.  Mental Status Examination:  Appearance: well groomed and appropriate hygiene  Behavior: appropriate to circumstances Mood: euthymic Affect: mood congruent Speech: normal in rate, volume, and tone Eye Contact: appropriate Psychomotor Activity: appropriate Gait: unable to assess Thought Process: linear, logical, and goal directed  Thought Content/Perception: no hallucinations, delusions, bizarre thinking or behavior reported or observed and no evidence of suicidal and homicidal ideation, plan, and intent Orientation: time, person, place and purpose of appointment Memory/Concentration: memory, attention, language, and fund of knowledge intact  Insight/Judgment: good  Interventions:  Conducted a brief chart review Provided empathic reflections and validation Employed supportive psychotherapy interventions to facilitate reduced distress and to improve coping skills with identified stressors Employed motivational interviewing skills to assess patient's willingness/desire to adhere to recommended medical treatments and assignments Engaged patient in problem solving Psychoeducation provided regarding triggers for emotional eating  DSM-5 Diagnosis(es): 300.02 (F41.1) Generalized Anxiety Disorder  Treatment Goal & Progress: During the initial appointment with this provider, the following treatment goal was established: increase coping skills. Vanessa Doyle has demonstrated progress in her goal as evidenced by increased awareness of hunger patterns.   Plan: Based on appointment availability and Vanessa Doyle's schedule, the next appointment will be scheduled in approximately three weeks, which will be via MyChart Video Visit. The next session will focus on working towards the established treatment goal.

## 2019-12-26 ENCOUNTER — Ambulatory Visit (INDEPENDENT_AMBULATORY_CARE_PROVIDER_SITE_OTHER): Payer: 59 | Admitting: Psychiatry

## 2019-12-26 ENCOUNTER — Other Ambulatory Visit: Payer: Self-pay

## 2019-12-26 DIAGNOSIS — F411 Generalized anxiety disorder: Secondary | ICD-10-CM

## 2019-12-26 NOTE — Progress Notes (Signed)
[facility] Counselor/Therapist Progress Note  Patient ID: Vanessa Doyle, MRN: [id number],    Date: 12/26/2019  Time Spent: 60 minutes   10:00am to 11:00am  Treatment Type: Individual Therapy  Reported Symptoms:  Anxiety, stressed with transfer to another college to complete her education   Mental Status Exam:  Appearance:   Casual     Behavior:  Appropriate, Sharing and Motivated  Motor:  Normal  Speech/Language:   Clear and Coherent  Affect:  anxeity  Mood:  anxious  Thought process:  goal directed  Thought content:    some obsessive thoughts  Sensory/Perceptual disturbances:    WNL  Orientation:  oriented to person, place, time/date, situation, day of week, month of year and year  Attention:  Good  Concentration:  Good  Memory:  WNL  Fund of knowledge:   Good  Insight:    Good  Judgment:   Good  Impulse Control:  Good   Risk Assessment: Danger to Self:  No Self-injurious Behavior: No Danger to Others: No Duty to Warn:no Physical Aggression / Violence:No  Access to Firearms a concern: No  Gang Involvement:No   Subjective: Patient today reports anxiety, and some stress over moving away to complete her college education. Is showing some reduction in anxiety.  Interventions: Cognitive Behavioral Therapy and Solution-Oriented/Positive Psychology  Diagnosis:   ICD-10-CM   1. Generalized anxiety disorder  F41.1      Plan: Patient not signing any updates for tx plan on computer screen due to COVID.We altered her long term and short term goals, and strategies a bit to better assess progress or lack of progress.  Treatment Goals: Some goals may remain on tx plan as patient works on them, however progress will be noted each session in "Progress" sections.  Long term goal: Stabilize anxiety level while increasing ability to function on a daily basis, as Evidenced by patient being able to function daily without feeling her anxiety level impedes her  functioning level.Vanessa Doyle term goal: Increase understanding of beliefs and messages that produce worry and amxiety, as Evidenced by patient clearly stating her improved understanding of negative/anxious thought patterns and how they affect how they lead to worry and anxiety.  Strategies: 1)Help patient develop reality-based, positive cognitive messages. 2)Increase dailyuse of positive self-talk.  Progressing: Patient in today reporting anxiety and feeling stressed in her upcoming transfer to another college out of town to complete her education. Is to work some this summer with [employer] and has some anxiety about that but also looking forward to it.  Recent trip to the college where she'll be this upcoming fall.  Excited and nervous, first time she's lived out of town from family.  Processed some of her thoughts and feelings about leaving. IIt is noticeable that patient is making progress in reducing her overall anxiety some as noted in her participating in some activities with others that she does not know, speaking up for herself some more, Not as frightened by difficulties and able to see possibilities for her negotiating stressful circumstances a little more. Is taking summer classes to help finish her work at [employer]. Encouraged to continue her strategies that have helped reduce her anxiety including positive self-talk per goal/stratey above, exercise, deep breathing exercises, focusing on the positives versus negatives, looking at what might go right versus wrong, good sleep habits, and working to improve her self esteem.   Goal review and progress noted with patient.  Next appt within 2-3  weeks.   Vanessa Ace, LCSW

## 2020-01-02 ENCOUNTER — Ambulatory Visit: Payer: 59 | Admitting: Psychiatry

## 2020-01-05 ENCOUNTER — Ambulatory Visit (INDEPENDENT_AMBULATORY_CARE_PROVIDER_SITE_OTHER): Payer: 59 | Admitting: Family Medicine

## 2020-01-05 ENCOUNTER — Encounter (INDEPENDENT_AMBULATORY_CARE_PROVIDER_SITE_OTHER): Payer: Self-pay | Admitting: Family Medicine

## 2020-01-05 ENCOUNTER — Telehealth (INDEPENDENT_AMBULATORY_CARE_PROVIDER_SITE_OTHER): Payer: 59 | Admitting: Psychology

## 2020-01-05 ENCOUNTER — Other Ambulatory Visit: Payer: Self-pay

## 2020-01-05 VITALS — BP 110/69 | HR 69 | Temp 98.4°F | Ht 64.0 in | Wt 228.0 lb

## 2020-01-05 DIAGNOSIS — Z6839 Body mass index (BMI) 39.0-39.9, adult: Secondary | ICD-10-CM

## 2020-01-05 DIAGNOSIS — F411 Generalized anxiety disorder: Secondary | ICD-10-CM | POA: Diagnosis not present

## 2020-01-05 DIAGNOSIS — E559 Vitamin D deficiency, unspecified: Secondary | ICD-10-CM | POA: Diagnosis not present

## 2020-01-05 DIAGNOSIS — Z9189 Other specified personal risk factors, not elsewhere classified: Secondary | ICD-10-CM | POA: Diagnosis not present

## 2020-01-05 MED ORDER — VITAMIN D (ERGOCALCIFEROL) 1.25 MG (50000 UNIT) PO CAPS: 50000.0000 [IU] | ORAL_CAPSULE | ORAL | 0 refills | Status: DC

## 2020-01-05 NOTE — Progress Notes (Signed)
Chief Complaint:   OBESITY Vanessa Doyle is here to discuss her progress with her obesity treatment plan along with follow-up of her obesity related diagnoses. Vanessa Doyle is on the Category 2 Plan and states she is following her eating plan approximately 90% of the time. Vanessa Doyle states she is walking for 45 minutes 2-3 times per week.  Today's visit was #: 3 Starting weight: 229 lbs Starting date: 12/07/2019 Today's weight: 228 lbs Today's date: 01/05/2020 Total lbs lost to date: 1 lb Total lbs lost since last in-office visit: 0  Interim History: Vanessa Doyle says she has been doing well over the last few weeks.  For breakfast, she has been having yogurt with a wrap and cheese.  Lunch is a salad with chicken (eats break calories as wraps throughout the day).  She eats 6-8 ounces of meat and vegetables at dinner but sometimes cannot finish it all.  Snacks consist of lowfat cottage cheese and jerky.  She denies hunger.  Subjective:   1. Vitamin D deficiency Vanessa Doyle's Vitamin D level was 30.5 on 12/07/2019. She is currently taking prescription vitamin D 50,000 IU each week. She denies nausea, vomiting or muscle weakness.  She endorses fatigue.  2. At risk for osteoporosis Vanessa Doyle is at higher risk of osteopenia and osteoporosis due to Vitamin D deficiency.   Assessment/Plan:   1. Vitamin D deficiency Low Vitamin D level contributes to fatigue and are associated with obesity, breast, and colon cancer. She agrees to continue to take prescription Vitamin D @50 ,000 IU every week and will follow-up for routine testing of Vitamin D, at least 2-3 times per year to avoid over-replacement. - Vitamin D, Ergocalciferol, (DRISDOL) 1.25 MG (50000 UNIT) CAPS capsule; Take 1 capsule (50,000 Units total) by mouth every 7 (seven) days.  Dispense: 4 capsule; Refill: 0  2. At risk for osteoporosis Vanessa Doyle was given approximately 15 minutes of osteoporosis prevention counseling today. Vanessa Doyle is at risk for osteopenia and  osteoporosis due to her Vitamin D deficiency. She was encouraged to take her Vitamin D and follow her higher calcium diet and increase strengthening exercise to help strengthen her bones and decrease her risk of osteopenia and osteoporosis.  Repetitive spaced learning was employed today to elicit superior memory formation and behavioral change.  3. Class 2 severe obesity with serious comorbidity and body mass index (BMI) of 39.0 to 39.9 in adult, unspecified obesity type (HCC) Vanessa Doyle is currently in the action stage of change. As such, her goal is to continue with weight loss efforts. She has agreed to the Category 2 Plan.   Exercise goals: As is.  Behavioral modification strategies: increasing lean protein intake, increasing vegetables, meal planning and cooking strategies and planning for success.  Vanessa Doyle has agreed to follow-up with our clinic in 2 weeks. She was informed of the importance of frequent follow-up visits to maximize her success with intensive lifestyle modifications for her multiple health conditions.   Objective:   Blood pressure 110/69, pulse 69, temperature 98.4 F (36.9 C), temperature source Oral, height 5\' 4"  (1.626 m), weight 228 lb (103.4 kg), last menstrual period 12/27/2019, SpO2 97 %. Body mass index is 39.14 kg/m.  General: Cooperative, alert, well developed, in no acute distress. HEENT: Conjunctivae and lids unremarkable. Cardiovascular: Regular rhythm.  Lungs: Normal work of breathing. Neurologic: No focal deficits.   Lab Results  Component Value Date   CREATININE 0.72 12/07/2019   BUN 9 12/07/2019   NA 139 12/07/2019   K 4.2 12/07/2019  CL 102 12/07/2019   CO2 21 12/07/2019   Lab Results  Component Value Date   ALT 66 (H) 12/07/2019   AST 24 12/07/2019   ALKPHOS 95 12/07/2019   BILITOT 0.3 12/07/2019   Lab Results  Component Value Date   HGBA1C 5.6 12/07/2019   Lab Results  Component Value Date   INSULIN 14.0 12/07/2019   Lab Results    Component Value Date   TSH 0.788 12/07/2019   Lab Results  Component Value Date   CHOL 136 12/07/2019   HDL 55 12/07/2019   LDLCALC 67 12/07/2019   TRIG 67 12/07/2019   Lab Results  Component Value Date   WBC 5.7 12/07/2019   HGB 13.0 12/07/2019   HCT 39.8 12/07/2019   MCV 86 12/07/2019   PLT 285 12/07/2019   Attestation Statements:   Reviewed by clinician on day of visit: allergies, medications, problem list, medical history, surgical history, family history, social history, and previous encounter notes.  I, Insurance claims handler, CMA, am acting as transcriptionist for Reuben Likes, MD. I have reviewed the above documentation for accuracy and completeness, and I agree with the above. - Katherina Mires, MD

## 2020-01-18 ENCOUNTER — Other Ambulatory Visit: Payer: Self-pay

## 2020-01-18 ENCOUNTER — Ambulatory Visit (INDEPENDENT_AMBULATORY_CARE_PROVIDER_SITE_OTHER): Payer: 59 | Admitting: Psychiatry

## 2020-01-18 DIAGNOSIS — F411 Generalized anxiety disorder: Secondary | ICD-10-CM | POA: Diagnosis not present

## 2020-01-18 NOTE — Progress Notes (Unsigned)
Office: [phone]  /  Fax: [phone]    Date: January 30, 2020   Appointment Start Time: *** Duration: *** minutes Provider: Lawerance Cruel, Psy.D. Type of Session: Individual Therapy  Location of Patient: {gbptloc:23249} Location of Provider: Provider's Home Type of Contact: Telepsychological Visit via MyChart Video Visit  Session Content: This provider called Vanessa Doyle at 8:03am as she did not present for the telepsychological appointment. *** As such, today's appointment was initiated *** minutes late.  Vanessa Doyle is a 19 y.o. female presenting via MyChart Video Visit for a follow-up appointment to address the previously established treatment goal of increasing coping skills. Today's appointment was a telepsychological visit due to COVID-19. Vanessa Doyle provided verbal consent for today's telepsychological appointment and she is aware she is responsible for securing confidentiality on her end of the session. Prior to proceeding with today's appointment, Vanessa Doyle's physical location at the time of this appointment was obtained as well a phone number she could be reached at in the event of technical difficulties. Vanessa Doyle and this provider participated in today's telepsychological service.   This provider conducted a brief check-in and verbally administered the PHQ-9 and GAD-7. *** Vanessa Doyle was receptive to today's appointment as evidenced by openness to sharing, responsiveness to feedback, and {gbreceptiveness:23401}.  Mental Status Examination:  Appearance: well groomed and appropriate hygiene  Behavior: appropriate to circumstances Mood: euthymic Affect: mood congruent Speech: normal in rate, volume, and tone Eye Contact: appropriate Psychomotor Activity: appropriate Gait: unable to assess Thought Process: linear, logical, and goal directed  Thought Content/Perception: no hallucinations, delusions, bizarre thinking or behavior reported or observed and no evidence of suicidal and homicidal ideation, plan,  and intent Orientation: time, person, place, and purpose of appointment Memory/Concentration: memory, attention, language, and fund of knowledge intact  Insight/Judgment: good  Structured Assessments Results: The Patient Health Questionnaire-9 (PHQ-9) is a self-report measure that assesses symptoms and severity of depression over the course of the last two weeks. Vanessa Doyle obtained a score of *** suggesting {GBPHQ9SEVERITY:21752}. Vanessa Doyle finds the endorsed symptoms to be {gbphq9difficulty:21754}. [0= Not at all; 1= Several days; 2= More than half the days; 3= Nearly every day] Little interest or pleasure in doing things ***  Feeling down, depressed, or hopeless ***  Trouble falling or staying asleep, or sleeping too much ***  Feeling tired or having little energy ***  Poor appetite or overeating ***  Feeling bad about yourself --- or that you are a failure or have let yourself or your family down ***  Trouble concentrating on things, such as reading the newspaper or watching television ***  Moving or speaking so slowly that other people could have noticed? Or the opposite --- being so fidgety or restless that you have been moving around a lot more than usual ***  Thoughts that you would be better off dead or hurting yourself in some way ***  PHQ-9 Score ***    The Generalized Anxiety Disorder-7 (GAD-7) is a brief self-report measure that assesses symptoms of anxiety over the course of the last two weeks. Vanessa Doyle obtained a score of *** suggesting {gbgad7severity:21753}. Vanessa Doyle finds the endorsed symptoms to be {gbphq9difficulty:21754}. [0= Not at all; 1= Several days; 2= Over half the days; 3= Nearly every day] Feeling nervous, anxious, on edge ***  Not being able to stop or control worrying ***  Worrying too much about different things ***  Trouble relaxing ***  Being so restless that it's hard to sit still ***  Becoming easily annoyed or irritable ***  Feeling afraid  as if something awful might  happen ***  GAD-7 Score ***   Interventions:  {Interventions for Progress Notes:23405}  DSM-5 Diagnosis(es): 300.02 (F41.1) Generalized Anxiety Disorder  Treatment Goal & Progress: During the initial appointment with this provider, the following treatment goal was established: increase coping skills. Vanessa Doyle has demonstrated progress in her goal as evidenced by {gbtxprogress:22839}. Vanessa Doyle also {gbtxprogress2:22951}.  Plan: The next appointment will be scheduled in {gbweeks:21758}, which will be {gbtxmodality:23402}. The next session will focus on {Plan for Next Appointment:23400}.

## 2020-01-18 NOTE — Progress Notes (Signed)
[facility] Counselor/Therapist Progress Note  Patient ID: Vanessa Doyle, MRN: [id number],    Date: 01/18/2020  Time Spent: 60 minutes  8:00am to 9:00am  Treatment Type: Individual Therapy  Reported Symptoms: anxiety, stressed "with upcoming transition to 4 yr college and life" Mental Status Exam:  Appearance:   Casual     Behavior:  Appropriate and Sharing  Motor:  Normal  Speech/Language:   Normal Rate  Affect:  anxious  Mood:  anxious  Thought process:  goal directed  Thought content:    some obsessiveness tied to her anxiety  Sensory/Perceptual disturbances:    WNL  Orientation:  oriented to person, place, time/date, situation, day of week, month of year and year  Attention:  Good  Concentration:  Good and Fair  Memory:  WNL  Fund of knowledge:   Good  Insight:    Good and Fair  Judgment:   Good and Fair  Impulse Control:  Good   Risk Assessment: Danger to Self:  No Self-injurious Behavior: No Danger to Others: No Duty to Warn:no Physical Aggression / Violence:No  Access to Firearms a concern: No  Gang Involvement:No   Subjective: Patient today reports anxiety is her main symptom and relates more to upcoming school transition and "life".  Interventions: Cognitive Behavioral Therapy and Solution-Oriented/Positive Psychology  Diagnosis:   ICD-10-CM   1. Generalized anxiety disorder  F41.1      Plan: Patient not signing any updates for tx plan on computer screen due to COVID.We altered her long term and short term goals, and strategies a bit to better assess progress or lack of progress.  Treatment Goals: Some goals may remain on tx plan as patient works on them, however progress will be noted each session in "Progress" sections.  Long term goal: Stabilize anxiety level while increasing ability to function on a daily basis, as Evidenced by patient being able to function daily without feeling her anxiety level impedes her functioning level.[name] term goal: Increase understanding of beliefs and messages that produce worry and amxiety, as Evidenced by patient clearly stating her improved understanding of negative/anxious thought patterns and how they affect how they lead to worry and anxiety.  Strategies: 1)Help patient develop reality-based, positive cognitive messages. 2)Increase dailyuse of positive self-talk.  Progressing: Patient in today reporting anxiety and adds that she is working hard to better manage her anxiety.  States it's a little easier to manage the anxiety when she is able to talk through it with someone that understands, and that's not always possible. Adds that anxiety management is more difficult for her when she's more isolated or not able to talk things through.  Is following through on some of our prior sessions where we named certain activities that can help her with her anxiety including walks, games on her computer, dancing (which she enjoys), listening to podcasts, time with family, reaching out to friends.  Worked with patient on some of her anxious thoughts today as she thinks about school transition, anxious thoughts about moving and living away from home, meeting new people, and dealing with all the stressors of school (deadlines, etc.). Is showing some progress in these areas and motivated to keep working towards better anxiety management and feeling calmer within herself.  Continues to work on positive self-talk leading to improved self-esteem.  Goal review and progress noted with patient.  Next appt within 2-3 week.   [name], LCSW

## 2020-01-23 ENCOUNTER — Other Ambulatory Visit: Payer: Self-pay

## 2020-01-23 ENCOUNTER — Encounter (INDEPENDENT_AMBULATORY_CARE_PROVIDER_SITE_OTHER): Payer: Self-pay | Admitting: Adult Health

## 2020-01-23 ENCOUNTER — Ambulatory Visit (INDEPENDENT_AMBULATORY_CARE_PROVIDER_SITE_OTHER): Payer: 59 | Admitting: Adult Health

## 2020-01-23 VITALS — BP 115/76 | HR 83 | Temp 98.7°F | Ht 64.0 in | Wt 232.0 lb

## 2020-01-23 DIAGNOSIS — Z9189 Other specified personal risk factors, not elsewhere classified: Secondary | ICD-10-CM | POA: Diagnosis not present

## 2020-01-23 DIAGNOSIS — R7303 Prediabetes: Secondary | ICD-10-CM

## 2020-01-23 DIAGNOSIS — E559 Vitamin D deficiency, unspecified: Secondary | ICD-10-CM | POA: Diagnosis not present

## 2020-01-23 DIAGNOSIS — Z6839 Body mass index (BMI) 39.0-39.9, adult: Secondary | ICD-10-CM

## 2020-01-23 MED ORDER — VITAMIN D (ERGOCALCIFEROL) 1.25 MG (50000 UNIT) PO CAPS: 50000.0000 [IU] | ORAL_CAPSULE | ORAL | 0 refills | Status: DC

## 2020-01-24 ENCOUNTER — Ambulatory Visit (INDEPENDENT_AMBULATORY_CARE_PROVIDER_SITE_OTHER): Payer: 59 | Admitting: Psychiatry

## 2020-01-24 ENCOUNTER — Encounter: Payer: Self-pay | Admitting: Psychiatry

## 2020-01-24 VITALS — Ht 64.0 in | Wt 234.0 lb

## 2020-01-24 DIAGNOSIS — F411 Generalized anxiety disorder: Secondary | ICD-10-CM

## 2020-01-24 DIAGNOSIS — F5105 Insomnia due to other mental disorder: Secondary | ICD-10-CM | POA: Diagnosis not present

## 2020-01-24 DIAGNOSIS — R7303 Prediabetes: Secondary | ICD-10-CM | POA: Insufficient documentation

## 2020-01-24 DIAGNOSIS — E559 Vitamin D deficiency, unspecified: Secondary | ICD-10-CM | POA: Insufficient documentation

## 2020-01-24 MED ORDER — MELATONIN 10 MG PO CAPS: 10.0000 | ORAL_CAPSULE | Freq: Every evening | ORAL | 0 refills | Status: DC | PRN

## 2020-01-24 NOTE — Progress Notes (Signed)
[facility] Med Check  Patient ID: Vanessa Doyle,  MRN: [id number]  PCP: Seward Carol, MD  Date of Evaluation: 01/24/2020 Time spent:15 minutes from 0905 to 0920  Chief Complaint:  Chief Complaint    Anxiety      HISTORY/CURRENT STATUS: Vanessa Doyle is seen onsite in office 15 minutes face-to-face individually with consent with epic collateral for psychiatric interview and exam in 34-month evaluation and management of generalized anxiety and associated insomnia no longer having any depersonalization or derealization dissociative symptoms.  In the interim she has discontinued BuSpar for anxiety and Restoril for insomnia now using just melatonin as needed with reasonable success and no hangover impacting her need to get up at 0630 for completing [place] in which she has succeeded.  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  She reviews all current and past diagnosis and treatment needing no rescue medications and instilling confidence in others and vice versa for process and the future.  She has no mania, suicidality, psychosis, or delirium   Individual Medical History/ Review of Systems: Changes? :Yes Weight is up 3 pounds having no other current medical concerns needing with weight management 12/21/2019 and a week later in follow-up.  Allergies: Patient has no known allergies.  Current Medications:  Current Outpatient Medications:  .  acetaminophen (TYLENOL) 325 MG tablet, Take 2 tablets (650 mg total) by mouth every 6 (six) hours as needed for fever or mild pain., Disp: , Rfl:  .  ibuprofen (ADVIL) 200 MG tablet, Take 200 mg by mouth every 6 (six) hours as needed., Disp: , Rfl:  .  Melatonin 10 MG CAPS, Take 10 capsules by mouth at bedtime as needed., Disp: 30 capsule, Rfl: 0 .  Vitamin D, Ergocalciferol, (DRISDOL) 1.25 MG (50000  UNIT) CAPS capsule, Take 1 capsule (50,000 Units total) by mouth every 7 (seven) days., Disp: 4 capsule, Rfl: 0  Medication Side Effects: none  Family Medical/ Social History: Changes? No  MENTAL HEALTH EXAM:  Height 5\' 4"  (1.626 m), weight 234 lb (106.1 kg), last menstrual period 12/27/2019.Body mass index is 40.17 kg/m. Muscle strengths and tone 5/5, postural reflexes and gait 0/0, and AIMS = 0.  General Appearance: Casual, Well Groomed and Obese  Eye Contact:  Good  Speech:  Clear and Coherent, Normal Rate and Talkative  Volume:  Normal  Mood:  Anxious and Euthymic  Affect:  Appropriate, Full Range and Anxious  Thought Process:  Coherent, Goal Directed and Descriptions of Associations: Tangential  Orientation:  Full (Time, Place, and Person)  Thought Content: Logical and Tangential   Suicidal Thoughts:  No  Homicidal Thoughts:  No  Memory:  Immediate;   Good Remote;   Good  Judgement:  Good  Insight:  Fair  Psychomotor Activity:  Normal and Mannerisms  Concentration:  Concentration: Good and Attention Span: Good  Recall:  Good  Fund of Knowledge: Good  Language: Good  Assets:  Desire for Improvement Resilience Talents/Skills Vocational/Educational  ADL's:  Intact  Cognition: WNL  Prognosis:  Good    DIAGNOSES:    ICD-10-CM   1. Generalized anxiety disorder  F41.1   2. Insomnia disorder, with non-sleep disorder mental comorbidity, persistent  F51.05     Receiving Psychotherapy: Yes  with Shanon Ace, LCSW.   RECOMMENDATIONS: Psychosupportive psychoeducation review of past and present psychological functioning may provide a baseline for  the patient's weight management work with psychologist.  She also continues therapy with [name] expecting she can have sessions possibly monthly planning to come home for a weekend each month likely to get off early on Friday.  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  She completes Restoril and BuSpar closure for use of OTC melatonin when needed for insomnia.  She understands the resources that can potentially be provided on an as-needed basis in the interim over the next year but does not schedule return appointment expecting in confidence her closure and generalization will be successful.   Chauncey Mann, MD

## 2020-01-24 NOTE — Progress Notes (Signed)
Chief Complaint:   OBESITY Vanessa Doyle is here to discuss her progress with her obesity treatment plan along with follow-up of her obesity related diagnoses. Vanessa Doyle is on the Category 2 Plan and states she is following her eating plan approximately 85% of the time. Vanessa Doyle states she is walking for 45 minutes 3 times per week.  Today's visit was #: 4 Starting weight: 229 lbs Starting date: 12/07/2019 Today's weight: 232 lbs Today's date: 01/23/2020 Total lbs lost to date: 0 Total lbs lost since last in-office visit: 0  Interim History: Vanessa Doyle recently traveled out of town to visit family and was challenged to remain on plan consistently.  She reports increase in hunger, often between lunch and dinner.  Subjective:   1. Vitamin D deficiency Vanessa Doyle's Vitamin D level was 30.5 on 12/07/2019. She is currently taking prescription vitamin D 50,000 IU each week. She denies nausea, vomiting or muscle weakness.  2. Prediabetes Vanessa Doyle has a diagnosis of prediabetes based on her elevated HgA1c and was informed this puts her at greater risk of developing diabetes. She continues to work on diet and exercise to decrease her risk of diabetes. She denies nausea or hypoglycemia.  She has family history of diabetes.  She reports increased hunger levels between lunch and dinner 2-3 times per week.   Lab Results  Component Value Date   HGBA1C 5.6 12/07/2019   Lab Results  Component Value Date   INSULIN 14.0 12/07/2019   3. At risk for diabetes mellitus Vanessa Doyle is at higher than average risk for developing diabetes due to IR, prediabetes, and obesity.  She deferred metformin at this time.   Assessment/Plan:   1. Vitamin D deficiency Low Vitamin D level contributes to fatigue and are associated with obesity, breast, and colon cancer. She agrees to continue to take prescription Vitamin D @50 ,000 IU every week and will follow-up for routine testing of Vitamin D, at least 2-3 times per year to avoid  over-replacement. - Vitamin D, Ergocalciferol, (DRISDOL) 1.25 MG (50000 UNIT) CAPS capsule; Take 1 capsule (50,000 Units total) by mouth every 7 (seven) days.  Dispense: 4 capsule; Refill: 0  2. Prediabetes Vanessa Doyle will continue to work on weight loss, exercise, and decreasing simple carbohydrates to help decrease the risk of diabetes.  We discussed the risks and benefits of metformin.  She would like to defer at this time.  Will check labs in 3 months.  If weight loss stalls or cravings/hunger increase, will consider initiation of metformin.  3. At risk for diabetes mellitus Vanessa Doyle was given approximately 15 minutes of diabetes education and counseling today. We discussed intensive lifestyle modifications today with an emphasis on weight loss as well as increasing exercise and decreasing simple carbohydrates in her diet. We also reviewed medication options with an emphasis on risk versus benefit of those discussed.   Repetitive spaced learning was employed today to elicit superior memory formation and behavioral change.  4. Class 2 severe obesity with serious comorbidity and body mass index (BMI) of 39.0 to 39.9 in adult, unspecified obesity type (HCC) Vanessa Doyle is currently in the action stage of change. As such, her goal is to continue with weight loss efforts. She has agreed to the Category 2 Plan.   Exercise goals: As is.  Behavioral modification strategies: increasing lean protein intake, increasing water intake, no skipping meals, meal planning and cooking strategies and celebration eating strategies.  Handout provided:  Protein Equivalent.  Vanessa Doyle has agreed to follow-up with our clinic in  2 weeks. She was informed of the importance of frequent follow-up visits to maximize her success with intensive lifestyle modifications for her multiple health conditions.   Objective:   Blood pressure 115/76, pulse 83, temperature 98.7 F (37.1 C), temperature source Oral, height 5\' 4"  (1.626 m), weight  232 lb (105.2 kg), last menstrual period 12/27/2019, SpO2 97 %. Body mass index is 39.82 kg/m.  General: Cooperative, alert, well developed, in no acute distress. HEENT: Conjunctivae and lids unremarkable. Cardiovascular: Regular rhythm.  Lungs: Normal work of breathing. Neurologic: No focal deficits.   Lab Results  Component Value Date   CREATININE 0.72 12/07/2019   BUN 9 12/07/2019   NA 139 12/07/2019   K 4.2 12/07/2019   CL 102 12/07/2019   CO2 21 12/07/2019   Lab Results  Component Value Date   ALT 66 (H) 12/07/2019   AST 24 12/07/2019   ALKPHOS 95 12/07/2019   BILITOT 0.3 12/07/2019   Lab Results  Component Value Date   HGBA1C 5.6 12/07/2019   Lab Results  Component Value Date   INSULIN 14.0 12/07/2019   Lab Results  Component Value Date   TSH 0.788 12/07/2019   Lab Results  Component Value Date   CHOL 136 12/07/2019   HDL 55 12/07/2019   LDLCALC 67 12/07/2019   TRIG 67 12/07/2019   Lab Results  Component Value Date   WBC 5.7 12/07/2019   HGB 13.0 12/07/2019   HCT 39.8 12/07/2019   MCV 86 12/07/2019   PLT 285 12/07/2019   Attestation Statements:   Reviewed by clinician on day of visit: allergies, medications, problem list, medical history, surgical history, family history, social history, and previous encounter notes.  I, 12/09/2019, CMA, am acting as Insurance claims handler for Energy manager, NP.  I have reviewed the above documentation for accuracy and completeness, and I agree with the above. -  William Hamburger, NP

## 2020-01-26 ENCOUNTER — Other Ambulatory Visit: Payer: Self-pay

## 2020-01-26 ENCOUNTER — Ambulatory Visit (INDEPENDENT_AMBULATORY_CARE_PROVIDER_SITE_OTHER): Payer: 59 | Admitting: Psychiatry

## 2020-01-26 DIAGNOSIS — F411 Generalized anxiety disorder: Secondary | ICD-10-CM | POA: Diagnosis not present

## 2020-01-26 NOTE — Progress Notes (Signed)
      [facility] Counselor/Therapist Progress Note  Patient ID: Vanessa Doyle, MRN: [id number],    Date: 01/26/2020  Time Spent: 50 minutes   8:00am to 8:50am  Treatment Type: Individual Therapy  Reported Symptoms: anxiety (improving), motivation improving  Mental Status Exam:  Appearance:   Casual     Behavior:  Appropriate and Sharing  Motor:  Normal  Speech/Language:   Normal Rate  Affect:  anxious  Mood:  anxious  Thought process:  goal directed  Thought content:    WNL  Sensory/Perceptual disturbances:    WNL  Orientation:  oriented to person, place, time/date, situation, day of week, month of year and year  Attention:  Good  Concentration:  Good and Fair  Memory:  some forgetfulness  Fund of knowledge:   Good  Insight:    Good and Fair  Judgment:   Good and Fair  Impulse Control:  Good and Fair   Risk Assessment: Danger to Self:  No Self-injurious Behavior: No Danger to Others: No Duty to Warn:no Physical Aggression / Violence:No  Access to Firearms a concern: No  Gang Involvement:No   Subjective: Patient today reports some improvement in her anxiety and motivation is better.  Interventions: Cognitive Behavioral Therapy, Solution-Oriented/Positive Psychology and Ego-Supportive  Diagnosis:   ICD-10-CM   1. Generalized anxiety disorder  F41.1      Plan: Patient not signing any updates for tx plan on computer screen due to COVID.We altered her long term and short term goals, and strategies a bit to better assess progress or lack of progress.  Treatment Goals: Some goals may remain on tx plan as patient works on them, however progress will be noted each session in "Progress" sections.  Long term goal: Stabilize anxiety level while increasing ability to function on a daily basis, as Evidenced by patient being able to function daily without feeling her anxiety level impedes her functioning level.[name] term goal: Increase understanding of  beliefs and messages that produce worry and amxiety, as Evidenced by patient clearly stating her improved understanding of negative/anxious thought patterns and how they affect how they lead to worry and anxiety.  Strategies: 1)Help patient develop reality-based, positive cognitive messages. 2)Increase dailyuse of positive self-talk.  Progressing: Patient in today reporting anxiety but noticing more improvement including better sleep routine, some lessening of anxiety in situation where she'd normally be quite anxious, getting up better without drowsiness, and increase in her being productive and"has become less of a procrastinator."  More intentionally working to better manage her anxiety and is having some success and feeling encouraged by this. 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 Continues activities of dancing, walking, games on computer, time with family, reaching out to friends, and listening to podcasts, all of which she reports helps with anxiety and stress.  Goal review and progress noted with patient.  Next appt within 2 weeks.   [name], LCSW

## 2020-01-30 ENCOUNTER — Telehealth (INDEPENDENT_AMBULATORY_CARE_PROVIDER_SITE_OTHER): Payer: 59 | Admitting: Psychology

## 2020-01-30 ENCOUNTER — Telehealth (INDEPENDENT_AMBULATORY_CARE_PROVIDER_SITE_OTHER): Payer: Self-pay | Admitting: Psychology

## 2020-01-30 NOTE — Telephone Encounter (Signed)
  Office: [phone]  /  Fax: [phone]  Date of Call: January 30, 2020  Time of Call: 8:03am Duration of Call: ~1.5 minutes Provider: Lawerance Cruel, PsyD  CONTENT: This provider called Vanessa Doyle to check-in as she did not present for today's MyChart Video Visit appointment at 8:00am. Vanessa Doyle indicated she forgot today's appointment, but shared "Things are going well." She was receptive to rescheduling the appointment and acknowledged understanding the one time no show fee waiver would be applied to today's appointment. No evidence of suicidal and homicidal ideation, plan, or intent.   PLAN: Vanessa Doyle is scheduled for an appointment on February 01, 2020 at 8:00am via MyChart Video Visit.

## 2020-01-30 NOTE — Progress Notes (Signed)
  Office: [phone]  /  Fax: [phone]    Date: February 01, 2020   Appointment Start Time: 7:49am Duration: 22 minutes Provider: Lawerance Cruel, Psy.D. Type of Session: Individual Therapy  Location of Patient: Home Location of Provider: Provider's Home Type of Contact: Telepsychological Visit via MyChart Video Visit  Session Content: Vanessa Doyle is a 19 y.o. female presenting via MyChart Video Visit for a follow-up appointment to address the previously established treatment goal of increasing coping skills. Today's appointment was a telepsychological visit due to COVID-19. Vanessa Doyle provided verbal consent for today's telepsychological appointment and she is aware she is responsible for securing confidentiality on her end of the session. Prior to proceeding with today's appointment, Vanessa Doyle's physical location at the time of this appointment was obtained as well a phone number she could be reached at in the event of technical difficulties. Vanessa Doyle and this provider participated in today's telepsychological service.   This provider conducted a brief check-in. Vanessa Doyle reported an increase in physical activity. She discussed getting hungry after meals recently, noting she is following her structured meal plan. Triggers for emotional eating were reviewed. She shared a reduction in emotional eating, adding she will try to make better choices if she engages in emotional eating. Additionally, psychoeducation regarding mindfulness was provided. A handout was provided to Vanessa Doyle with further information regarding mindfulness, including exercises. This provider also explained the benefit of mindfulness as it relates to emotional eating. Vanessa Doyle was encouraged to engage in the provided exercises between now and the next appointment with this provider. Vanessa Doyle agreed. During today's appointment, Vanessa Doyle was led through a mindfulness exercise involving her senses. Vanessa Doyle provided verbal consent during today's appointment for this  provider to send a handout about mindfulness via e-mail. Vanessa Doyle was receptive to today's appointment as evidenced by openness to sharing, responsiveness to feedback, and willingness to engage in mindfulness exercises to assist with coping.  Mental Status Examination:  Appearance: well groomed and appropriate hygiene  Behavior: appropriate to circumstances Mood: euthymic Affect: mood congruent Speech: normal in rate, volume, and tone Eye Contact: appropriate Psychomotor Activity: appropriate Gait: unable to assess Thought Process: linear, logical, and goal directed  Thought Content/Perception: no hallucinations, delusions, bizarre thinking or behavior reported or observed and no evidence of suicidal and homicidal ideation, plan, and intent Orientation: time, person, place, and purpose of appointment Memory/Concentration: memory, attention, language, and fund of knowledge intact  Insight/Judgment: good  Interventions:  Conducted a brief chart review Provided empathic reflections and validation Reviewed content from the previous session Employed supportive psychotherapy interventions to facilitate reduced distress and to improve coping skills with identified stressors Psychoeducation provided regarding mindfulness Engaged patient in mindfulness exercise(s) Employed acceptance and commitment interventions to emphasize mindfulness and acceptance without struggle  DSM-5 Diagnosis(es): 300.02 (F41.1) Generalized Anxiety Disorder  Treatment Goal & Progress: During the initial appointment with this provider, the following treatment goal was established: increase coping skills. Vanessa Doyle has demonstrated progress in her goal as evidenced by increased awareness of hunger patterns, increased awareness of triggers for emotional eating and reduction in emotional eating. Vanessa Doyle also demonstrates willingness to engage in mindfulness exercises.  Plan: The next appointment will be scheduled in two weeks, which  will be via MyChart Video Visit. The next session will focus on working towards the established treatment goal.

## 2020-02-01 ENCOUNTER — Telehealth (INDEPENDENT_AMBULATORY_CARE_PROVIDER_SITE_OTHER): Payer: 59 | Admitting: Psychology

## 2020-02-01 ENCOUNTER — Other Ambulatory Visit: Payer: Self-pay

## 2020-02-01 DIAGNOSIS — F411 Generalized anxiety disorder: Secondary | ICD-10-CM

## 2020-02-01 NOTE — Progress Notes (Signed)
  Office: [phone]  /  Fax: [phone]    Date: February 15, 2020   Appointment Start Time: 8:03am Duration: 21 minutes Provider: Lawerance Cruel, Psy.D. Type of Session: Individual Therapy  Location of Patient: Home Location of Provider: [facility] Type of Contact: Telepsychological Visit via MyChart Video Visit  Session Content:  Vanessa Doyle is a 19 y.o. female presenting via MyChart Video Visit for a follow-up appointment to address the previously established treatment goal of increasing coping skills. Today's appointment was a telepsychological visit due to COVID-19. Vanessa Doyle provided verbal consent for today's telepsychological appointment and she is aware she is responsible for securing confidentiality on her end of the session. Prior to proceeding with today's appointment, Vanessa Doyle's physical location at the time of this appointment was obtained as well a phone number she could be reached at in the event of technical difficulties. [name] and this provider participated in today's telepsychological service.   This provider conducted a brief check-in. Vanessa Doyle shared about recent events. She reported an increase in physical activity and an improvement in eating habits. Session focused further on mindfulness to assist with coping. She discussed engaging in shared exercises. Vanessa Doyle was led through a mindfulness exercise (A Taste of Mindfulness) and her experience was processed. Vanessa Doyle provided verbal consent during today's appointment for this provider to send a handout for today's exercise via e-mail. This provider also discussed the utilization of YouTube for mindfulness exercises (e.g., exercises by Rhae Hammock). Furthermore, termination planning was discussed. Vanessa Doyle was receptive to a follow-up appointment in 3-4 weeks and an additional follow-up/termination appointment in 3-4 weeks after that. Vanessa Doyle was receptive to today's appointment as evidenced by openness to sharing, responsiveness to  feedback, and willingness to continue engaging in mindfulness exercises.  Mental Status Examination:  Appearance: well groomed and appropriate hygiene  Behavior: appropriate to circumstances Mood: euthymic Affect: mood congruent Speech: normal in rate, volume, and tone Eye Contact: appropriate Psychomotor Activity: appropriate Gait: unable to assess Thought Process: linear, logical, and goal directed  Thought Content/Perception: no hallucinations, delusions, bizarre thinking or behavior reported or observed and no evidence of suicidal and homicidal ideation, plan, and intent Orientation: time, person, place, and purpose of appointment Memory/Concentration: memory, attention, language, and fund of knowledge intact  Insight/Judgment: good  Interventions:  Conducted a brief chart review Provided empathic reflections and validation Employed supportive psychotherapy interventions to facilitate reduced distress and to improve coping skills with identified stressors Engaged patient in mindfulness exercise(s) Employed acceptance and commitment interventions to emphasize mindfulness and acceptance without struggle Discussed termination planning  DSM-5 Diagnosis(es): 300.02 (F41.1) Generalized Anxiety Disorder  Treatment Goal & Progress: During the initial appointment with this provider, the following treatment goal was established: increase coping skills. Vanessa Doyle has demonstrated progress in her goal as evidenced by increased awareness of hunger patterns and increased awareness of triggers for emotional eating. Vanessa Doyle also continues to demonstrate willingness to engage in learned skill(s).  Plan: The next appointment will be scheduled in three weeks, which will be via MyChart Video Visit. The next session will focus on working towards the established treatment goal.

## 2020-02-02 ENCOUNTER — Other Ambulatory Visit: Payer: Self-pay

## 2020-02-02 ENCOUNTER — Ambulatory Visit (INDEPENDENT_AMBULATORY_CARE_PROVIDER_SITE_OTHER): Payer: 59 | Admitting: Psychiatry

## 2020-02-02 DIAGNOSIS — F411 Generalized anxiety disorder: Secondary | ICD-10-CM | POA: Diagnosis not present

## 2020-02-02 NOTE — Progress Notes (Signed)
[facility] Counselor/Therapist Progress Note  Patient ID: Vanessa Doyle, MRN: [id number],    Date: 02/02/2020  Time Spent: 60 minutes   7:59am to 8:58am  Treatment Type: Individual Therapy  Reported Symptoms: anxiety, stressed (but making progress)  Mental Status Exam:  Appearance:   Casual     Behavior:  Appropriate and Sharing  Motor:  Normal  Speech/Language:   Normal Rate  Affect:  anxious (improving some)  Mood:  anxious  Thought process:  normal  Thought content:    WNL  Sensory/Perceptual disturbances:    WNL  Orientation:  oriented to person, place, time/date, situation, day of week, month of year and year  Attention:  Good  Concentration:  Good and Fair  Memory:  "occasional forgetfulness but not bad"  Fund of knowledge:   Good  Insight:    Good  Judgment:   Good  Impulse Control:  Good and Fair   Risk Assessment: Danger to Self:  No Self-injurious Behavior: No Danger to Others: No Duty to Warn:no Physical Aggression / Violence:No  Access to Firearms a concern: No  Gang Involvement:No   Subjective: Patient today reporting anxiety and being stressed but she is showing progress also, maybe sometimes surprising herself.   Interventions: Cognitive Behavioral Therapy and Solution-Oriented/Positive Psychology  Diagnosis:   ICD-10-CM   1. Generalized anxiety disorder  F41.1      Plan: Patient not signing any updates for tx plan on computer screen due to COVID.We altered her long term and short term goals, and strategies a bit to better assess progress or lack of progress.  Treatment Goals: Some goals may remain on tx plan as patient works on them, however progress will be noted each session in "Progress" sections.  Long term goal: Stabilize anxiety level while increasing ability to function on a daily basis, as Evidenced by patient being able to function daily without feeling her anxiety level impedes her functioning level.[name] term  goal: Increase understanding of beliefs and messages that produce worry and amxiety, as Evidenced by patient clearly stating her improved understanding of negative/anxious thought patterns and how they affect how they lead to worry and anxiety.  Strategies: 1)Help patient develop reality-based, positive cognitive messages. 2)Increase dailyuse of positive self-talk.  Progressing: Patient in today and reports anxiety and often feeling stressed but is also making progress that is more noticeable. Wrapping up her work/courses at [employer] and getting ready to go to 4 yr college in August. Discussed some of her improvement including some decrease in her anxiety and in feeling less tired.  States she's able to use melatonin instead of previously prescribed sleep med and that seems to be working well.  Is trying to use some better health habits and has been taking a Barre exercise class.  Recognizes she still needs to work on decreasing her anxiety, continue to reduce her stress level and be able to have a good skill set for managing the anxiety and stress. To continue working on elevating her self-esteem and part of that is to also improve her self-talk to be more positive. Shares that she is becoming more aware of her negative self-talk, and needs to more consistently interrupt it. Encouraged to keep up the activities that she enjoys or finds helpful including time with family, listening to podcasts. Dancing, contact with friends, games on computer, and walking outside.  Goal review and progress noted with patient.  Next appt within 2 weeks.   [name],  LCSW

## 2020-02-07 ENCOUNTER — Other Ambulatory Visit: Payer: Self-pay

## 2020-02-07 ENCOUNTER — Ambulatory Visit (INDEPENDENT_AMBULATORY_CARE_PROVIDER_SITE_OTHER): Payer: 59 | Admitting: Family Medicine

## 2020-02-07 ENCOUNTER — Encounter (INDEPENDENT_AMBULATORY_CARE_PROVIDER_SITE_OTHER): Payer: Self-pay | Admitting: Family Medicine

## 2020-02-07 VITALS — BP 99/64 | HR 82 | Temp 98.2°F | Ht 64.0 in | Wt 229.0 lb

## 2020-02-07 DIAGNOSIS — Z9189 Other specified personal risk factors, not elsewhere classified: Secondary | ICD-10-CM | POA: Diagnosis not present

## 2020-02-07 DIAGNOSIS — F411 Generalized anxiety disorder: Secondary | ICD-10-CM | POA: Diagnosis not present

## 2020-02-07 DIAGNOSIS — E559 Vitamin D deficiency, unspecified: Secondary | ICD-10-CM | POA: Diagnosis not present

## 2020-02-07 DIAGNOSIS — R7303 Prediabetes: Secondary | ICD-10-CM | POA: Diagnosis not present

## 2020-02-07 DIAGNOSIS — Z6839 Body mass index (BMI) 39.0-39.9, adult: Secondary | ICD-10-CM

## 2020-02-08 NOTE — Progress Notes (Signed)
Chief Complaint:   OBESITY Vanessa Doyle is here to discuss her progress with her obesity treatment plan along with follow-up of her obesity related diagnoses. Vanessa Doyle is on the Category 2 Plan and states she is following her eating plan approximately 95% of the time. Vanessa Doyle states she is doing barre and strength training for 45-60 minutes 3-4 times per week.  Today's visit was #: 5 Starting weight: 229 lbs Starting date: 12/07/2019 Today's weight: 229 lbs Today's date: 02/07/2020 Total lbs lost to date: 0 Total lbs lost since last in-office visit: 3 lbs  Interim History: Vanessa Doyle started [employer] and likes it!  She has increased muscle and decreased fat according to bioimpedence results.  Subjective:   1. Prediabetes Vanessa Doyle has a diagnosis of prediabetes based on her elevated HgA1c and was informed this puts her at greater risk of developing diabetes. She continues to work on diet and exercise to decrease her risk of diabetes. She denies nausea or hypoglycemia.  Lab Results  Component Value Date   HGBA1C 5.6 12/07/2019   Lab Results  Component Value Date   INSULIN 14.0 12/07/2019   2. Vitamin D deficiency Vanessa Doyle's Vitamin D level was 30.5 on 12/07/2019. She is currently taking prescription vitamin D 50,000 IU each week. She denies nausea, vomiting or muscle weakness.  3. Generalized anxiety disorder Vanessa Doyle has been seeing Dr. Dewaine Conger for help with her anxiety.  Assessment/Plan:   1. Prediabetes Vanessa Doyle will continue to work on weight loss, exercise, and decreasing simple carbohydrates to help decrease the risk of diabetes.   2. Vitamin D deficiency Low Vitamin D level contributes to fatigue and are associated with obesity, breast, and colon cancer. She agrees to continue to take prescription Vitamin D @50 ,000 IU every week and will follow-up for routine testing of Vitamin D, at least 2-3 times per year to avoid over-replacement.  3. Generalized anxiety disorder Behavior modification  techniques were discussed today to help Vanessa Doyle deal with her anxiety.  Orders and follow up as documented in patient record.   4. At risk for deficient intake of food Behavior modification techniques were discussed today to help Vanessa Doyle deal with her anxiety.  Orders and follow up as documented in patient record.   5. Class 2 severe obesity with serious comorbidity and body mass index (BMI) of 39.0 to 39.9 in adult, unspecified obesity type (HCC) Vanessa Doyle is currently in the action stage of change. As such, her goal is to continue with weight loss efforts. She has agreed to the Category 2 Plan.   Exercise goals: For substantial health benefits, adults should do at least 150 minutes (2 hours and 30 minutes) a week of moderate-intensity, or 75 minutes (1 hour and 15 minutes) a week of vigorous-intensity aerobic physical activity, or an equivalent combination of moderate- and vigorous-intensity aerobic activity. Aerobic activity should be performed in episodes of at least 10 minutes, and preferably, it should be spread throughout the week.  Behavioral modification strategies: increasing lean protein intake.  Brelyn has agreed to follow-up with our clinic in 2-3 weeks. She was informed of the importance of frequent follow-up visits to maximize her success with intensive lifestyle modifications for her multiple health conditions.   Objective:   Blood pressure 99/64, pulse 82, temperature 98.2 F (36.8 C), temperature source Oral, height 5\' 4"  (1.626 m), weight 229 lb (103.9 kg), SpO2 98 %. Body mass index is 39.31 kg/m.  General: Cooperative, alert, well developed, in no acute distress. HEENT: Conjunctivae and lids  unremarkable. Cardiovascular: Regular rhythm.  Lungs: Normal work of breathing. Neurologic: No focal deficits.   Lab Results  Component Value Date   CREATININE 0.72 12/07/2019   BUN 9 12/07/2019   NA 139 12/07/2019   K 4.2 12/07/2019   CL 102 12/07/2019   CO2 21 12/07/2019   Lab  Results  Component Value Date   ALT 66 (H) 12/07/2019   AST 24 12/07/2019   ALKPHOS 95 12/07/2019   BILITOT 0.3 12/07/2019   Lab Results  Component Value Date   HGBA1C 5.6 12/07/2019   Lab Results  Component Value Date   INSULIN 14.0 12/07/2019   Lab Results  Component Value Date   TSH 0.788 12/07/2019   Lab Results  Component Value Date   CHOL 136 12/07/2019   HDL 55 12/07/2019   LDLCALC 67 12/07/2019   TRIG 67 12/07/2019   Lab Results  Component Value Date   WBC 5.7 12/07/2019   HGB 13.0 12/07/2019   HCT 39.8 12/07/2019   MCV 86 12/07/2019   PLT 285 12/07/2019   Attestation Statements:   Reviewed by clinician on day of visit: allergies, medications, problem list, medical history, surgical history, family history, social history, and previous encounter notes.  I, Insurance claims handler, CMA, am acting as transcriptionist for Helane Rima, DO  I have reviewed the above documentation for accuracy and completeness, and I agree with the above. Helane Rima, DO

## 2020-02-15 ENCOUNTER — Other Ambulatory Visit: Payer: Self-pay

## 2020-02-15 ENCOUNTER — Telehealth (INDEPENDENT_AMBULATORY_CARE_PROVIDER_SITE_OTHER): Payer: 59 | Admitting: Psychology

## 2020-02-15 DIAGNOSIS — F411 Generalized anxiety disorder: Secondary | ICD-10-CM

## 2020-02-16 ENCOUNTER — Other Ambulatory Visit: Payer: Self-pay

## 2020-02-16 ENCOUNTER — Ambulatory Visit (INDEPENDENT_AMBULATORY_CARE_PROVIDER_SITE_OTHER): Payer: 59 | Admitting: Psychiatry

## 2020-02-16 DIAGNOSIS — F411 Generalized anxiety disorder: Secondary | ICD-10-CM | POA: Diagnosis not present

## 2020-02-16 NOTE — Progress Notes (Signed)
[facility] Counselor/Therapist Progress Note  Patient ID: Vanessa Doyle, MRN: [id number],    Date: 02/16/2020  Time Spent: 48 minutes   9:12pm to 10:00am  Treatment Type: Individual Therapy  Reported Symptoms: anxiety, stressed "but improving"  Mental Status Exam:  Appearance:   Casual     Behavior:  Appropriate, Sharing and improvement in motivation  Motor:  Normal  Speech/Language:   Normal Rate  Affect:  anxious  Mood:  anxious  Thought process:  normal  Thought content:    WNL  Sensory/Perceptual disturbances:    WNL  Orientation:  oriented to person, place, time/date, situation, day of week, month of year and year  Attention:  Good  Concentration:  Good and Fair  Memory:  some forgetfulness worse under stress  Fund of knowledge:   Good  Insight:    Good  Judgment:   Good  Impulse Control:  Good and Fair   Risk Assessment: Danger to Self:  No Self-injurious Behavior: No Danger to Others: No Duty to Warn:no Physical Aggression / Violence:No  Access to Firearms a concern: No  Gang Involvement:No   Subjective:  Patient today reports anxiety, nervousness about moving to college within 1 month, stressed and has begun to manage some stressors more effectively.  Interventions: Cognitive Behavioral Therapy and Solution-Oriented/Positive Psychology  Diagnosis:   ICD-10-CM   1. Generalized anxiety disorder  F41.1      Plan: Patient not signing any updates for tx plan on computer screen due to COVID.We altered her long term and short term goals, and strategies a bit to better assess progress or lack of progress.  Treatment Goals: Some goals may remain on tx plan as patient works on them, however progress will be noted each session in "Progress" sections.  Long term goal: Stabilize anxiety level while increasing ability to function on a daily basis, as Evidenced by patient being able to function daily without feeling her anxiety level impedes her  functioning level.Vanessa Doyle term goal: Increase understanding of beliefs and messages that produce worry and amxiety, as Evidenced by patient clearly stating her improved understanding of negative/anxious thought patterns and how they affect how they lead to worry and anxiety.  Strategies: 1)Help patient develop reality-based, positive cognitive messages. 2)Increase dailyuse of positive self-talk.  Progressing: Patient is progressing especially in managing her stress as she works to be more proactive rather than reactive which is helping her.  Is having significant anxiety re: moving off to college.  "I'm stressed and anxious but I'm trying to keep working on being more positive and hopeful.  Trying to plan out my time and remaining assignments in current classes which end this month before she heads off to college where she is transferring in as a Junior and plans to complete her undergrad degree. Is showing some progress with her overthinking as it had been extreme but is now noticeably less as she continues to work on it.  Has been doing [place] classes for more physical movement, and sometimes is setting better boundaries which is also a work in progress. Some decrease in being critical of herself.  Patient to contine working on the overthinking, setting clear boundaries, maintaining some physical exercise, planning her time better, practicing the stress management skills that have been helpful more recently,and work more assertively on making her self-talk more positive.   Goal review and progress noted with patient.  Next appt within 2-3 weeks.   Vanessa Fare, LCSW

## 2020-02-22 NOTE — Progress Notes (Signed)
  Office: [phone]  /  Fax: [phone]    Date: March 07, 2020   Appointment Start Time: 8:04am Duration: 24 minutes Provider: Lawerance Cruel, Psy.D. Type of Session: Individual Therapy  Location of Patient: Home Location of Provider: Provider's Home Type of Contact: Telepsychological Visit via MyChart Video Visit  Session Content: This provider called [name] at 8:02am as she did not present for the telepsychological appointment. She noted she forgot about today's appointment, but stated she could join. As such, today's appointment was initiated 4 minutes late. Vanessa Doyle is a 19 y.o. female presenting via MyChart Video Visit for a follow-up appointment to address the previously established treatment goal of increasing coping skills. Today's appointment was a telepsychological visit due to COVID-19. Jadira provided verbal consent for today's telepsychological appointment and she is aware she is responsible for securing confidentiality on her end of the session. Prior to proceeding with today's appointment, [name]'s physical location at the time of this appointment was obtained as well a phone number she could be reached at in the event of technical difficulties. Charle and this provider participated in today's telepsychological service.   This provider conducted a brief check-in. [name] stated she finished her program and is in the process of packing for her move to [place]. Regarding eating, Bular stated it has been "going pretty good." Session focused on strategies for eating while moving and adjusting to her new apartment and class schedule. [name] expressed confidence in eating on plan for breakfast and stated a plan to engage in meal prep for the other meals. She also discussed a plan to go grocery shopping as soon as possible once she moves and preparing a master grocery plan to utilize for each shopping trip that includes foods from her meal plan. Additionally, this provider and [name] discussed the  importance of hydration. [name] was receptive to today's appointment as evidenced by openness to sharing, responsiveness to feedback, and willingness to implement discussed strategies .  Mental Status Examination:  Appearance: well groomed and appropriate hygiene  Behavior: appropriate to circumstances Mood: euthymic Affect: mood congruent Speech: normal in rate, volume, and tone Eye Contact: appropriate Psychomotor Activity: appropriate Gait: unable to assess Thought Process: linear, logical, and goal directed  Thought Content/Perception: no hallucinations, delusions, bizarre thinking or behavior reported or observed and no evidence of suicidal and homicidal ideation, plan, and intent Orientation: time, person, place, and purpose of appointment Memory/Concentration: memory, attention, language, and fund of knowledge intact  Insight/Judgment: good  Interventions:  Conducted a brief chart review Provided empathic reflections and validation Employed supportive psychotherapy interventions to facilitate reduced distress and to improve coping skills with identified stressors Engaged patient in problem solving  DSM-5 Diagnosis(es): 300.02 (F41.1) Generalized Anxiety Disorder  Treatment Goal & Progress: During the initial appointment with this provider, the following treatment goal was established: increase coping skills. [name] has demonstrated progress in her goal as evidenced by increased awareness of hunger patterns and triggers for emotional eating. [name] also continues to demonstrate willingness to engage in learned skill(s).  Plan: The next appointment will be scheduled in one month, which will be via MyChart Video Visit. The next session will focus on working towards the established treatment goal.

## 2020-02-23 ENCOUNTER — Ambulatory Visit (INDEPENDENT_AMBULATORY_CARE_PROVIDER_SITE_OTHER): Payer: 59 | Admitting: Psychiatry

## 2020-02-23 ENCOUNTER — Other Ambulatory Visit: Payer: Self-pay

## 2020-02-23 DIAGNOSIS — F411 Generalized anxiety disorder: Secondary | ICD-10-CM | POA: Diagnosis not present

## 2020-02-23 NOTE — Progress Notes (Signed)
      [facility] Counselor/Therapist Progress Note  Patient ID: Vanessa Doyle, MRN: [id number],    Date: 02/23/2020  Time Spent: 60 minutes   9:00am to 10:00am  Treatment Type: Individual Therapy  Reported Symptoms: anxiety, "nervously" stressed  Mental Status Exam:  Appearance:   Casual     Behavior:  Appropriate, Sharing and Motivated  Motor:  Normal  Speech/Language:   Clear and Coherent  Affect:  anxious  Mood:  anxious and nervous  Thought process:  normal  Thought content:    some obsessiveness  Sensory/Perceptual disturbances:    WNL  Orientation:  oriented to person, place, time/date, situation, day of week, month of year and year  Attention:  Good  Concentration:  Good and Fair  Memory:  WNL  Fund of knowledge:   Good  Insight:    Good  Judgment:   Good  Impulse Control:  Good   Risk Assessment: Danger to Self:  No Self-injurious Behavior: No Danger to Others: No Duty to Warn:no Physical Aggression / Violence:No  Access to Firearms a concern: No  Gang Involvement:No   Subjective:  Patient reports anxiety and feeling of "nervously stressed" about closing out her community college courses and her upcoming transition to complete her last 2 yrs of college and [employer].  Interventions: Cognitive Behavioral Therapy and Solution-Oriented/Positive Psychology  Diagnosis:   ICD-10-CM   1. Generalized anxiety disorder  F41.1     Plan: Patient not signing any updates for tx plan on computer screen due to COVID.We altered her long term and short term goals, and strategies a bit to better assess progress or lack of progress.  Treatment Goals: Some goals may remain on tx plan as patient works on them, however progress will be noted each session in "Progress" sections.  Long term goal: Stabilize anxiety level while increasing ability to function on a daily basis, as Evidenced by patient being able to function daily without feeling her anxiety level  impedes her functioning level.Vanessa Doyle term goal: Increase understanding of beliefs and messages that produce worry and amxiety, as Evidenced by patient clearly stating her improved understanding of negative/anxious thought patterns and how they affect how they lead to worry and anxiety.  Strategies: 1)Help patient develop reality-based, positive cognitive messages. 2)Increase dailyuse of positive self-talk.  Progressing: Patient in today with anxiety and feeling "nervously stressed mostly about current projects and portfolio to complete her community college courses and the transition to [employer] to complete her 3rd and 4th year of college.' Used session today to name and process her stressors especially school transition, leaving home and moving and living on her own, will miss her church, will miss friends she's made at [employer]. Discussed strategies for her in managing the transitions, the losses,and the temporary changes. Continues her Barre exercise classes and trying to be proactive vs reactive in looking at situations and changes/rearranges. Showing less criticalness of herself and will keep working on it, in addition to overthinking, doing some frequent physical exercise, practicing stress management discussed in sessions, and having more positive self-talk.   Goal review and progress noted with patient.  Next appt within 2 weeks.   [name], LCSW

## 2020-03-01 ENCOUNTER — Other Ambulatory Visit: Payer: Self-pay

## 2020-03-01 ENCOUNTER — Ambulatory Visit (INDEPENDENT_AMBULATORY_CARE_PROVIDER_SITE_OTHER): Payer: 59 | Admitting: Family Medicine

## 2020-03-01 ENCOUNTER — Ambulatory Visit (INDEPENDENT_AMBULATORY_CARE_PROVIDER_SITE_OTHER): Payer: 59 | Admitting: Psychiatry

## 2020-03-01 DIAGNOSIS — F411 Generalized anxiety disorder: Secondary | ICD-10-CM

## 2020-03-01 NOTE — Progress Notes (Signed)
      [facility] Counselor/Therapist Progress Note  Patient ID: Vanessa Doyle, MRN: [id number],    Date: 03/01/2020  Time Spent: 60 minutes  9:00am to 10:00pm   Treatment Type: Individual Therapy  Reported Symptoms: anxiety and stressed "but some better most recently"  Mental Status Exam:  Appearance:   Casual     Behavior:  Appropriate, Sharing and Motivated  Motor:  Normal  Speech/Language:   Normal Rate  Affect:  anxious  Mood:  anxious  Thought process:  goal directed  Thought content:    WNL  Sensory/Perceptual disturbances:    WNL  Orientation:  oriented to person, place, time/date, situation, day of week, month of year and year  Attention:  Good  Concentration:  Good and Fair  Memory:  WNL  Fund of knowledge:   Good  Insight:    Good and Fair  Judgment:   Good  Impulse Control:  Good   Risk Assessment: Danger to Self:  No Self-injurious Behavior: No Danger to Others: No Duty to Warn:no Physical Aggression / Violence:No  Access to Firearms a concern: No  Gang Involvement:No   Subjective: Patient report anxiety and feeling stressed today but also notices improvement in her management of some anxiety and stressful situations.   Interventions: Cognitive Behavioral Therapy and Solution-Oriented/Positive Psychology  Diagnosis:   ICD-10-CM   1. Generalized anxiety disorder  F41.1     Plan: Patient not signing any updates for tx plan on computer screen due to COVID.We altered her long term and short term goals, and strategies a bit to better assess progress or lack of progress.  Treatment Goals: Some goals may remain on tx plan as patient works on them, however progress will be noted each session in "Progress" sections.  Long term goal: Stabilize anxiety level while increasing ability to function on a daily basis, as Evidenced by patient being able to function daily without feeling her anxiety level impedes her functioning level.Vanessa Doyle term  goal: Increase understanding of beliefs and messages that produce worry and amxiety, as Evidenced by patient clearly stating her improved understanding of negative/anxious thought patterns and how they affect how they lead to worry and anxiety.  Strategies: 1)Help patient develop reality-based, positive cognitive messages. 2)Increase dailyuse of positive self-talk.  Progressing: Patient today not quite as stressed. Does report some anxiety but has noticed most recently it has decreased.  Self-confidence is showing some increase. Feels she still needs to work on motivation (especially with school work), creating a helpful routine when classes begin, and positive self-talk. Processing a lot of her feelings/emotions about upcoming transition to [employer].  Excited, anxious, more self-confident and looking forward to getting started with another academic year. Today she shares that she's found some activities that will be going on up at [employer] and she plans to get involved in some as a way to meet new people, which is a new behavior for patient that was strongly supported by therapist and her parents.  Reviewed strategies from last session to help manage life transitions which seemed helpful to patient.  Is also continuing her barre exercise class her before she leaves for school.  Emphasized positive self-talk.  Goal review and progress noted with patient.  Will have 1 more appt before leaving for school.   [name], LCSW

## 2020-03-04 ENCOUNTER — Encounter (INDEPENDENT_AMBULATORY_CARE_PROVIDER_SITE_OTHER): Payer: Self-pay

## 2020-03-05 ENCOUNTER — Ambulatory Visit (INDEPENDENT_AMBULATORY_CARE_PROVIDER_SITE_OTHER): Payer: 59 | Admitting: Family Medicine

## 2020-03-07 ENCOUNTER — Telehealth (INDEPENDENT_AMBULATORY_CARE_PROVIDER_SITE_OTHER): Payer: 59 | Admitting: Psychology

## 2020-03-07 DIAGNOSIS — F411 Generalized anxiety disorder: Secondary | ICD-10-CM | POA: Diagnosis not present

## 2020-03-08 ENCOUNTER — Other Ambulatory Visit: Payer: Self-pay

## 2020-03-08 ENCOUNTER — Ambulatory Visit (INDEPENDENT_AMBULATORY_CARE_PROVIDER_SITE_OTHER): Payer: 59 | Admitting: Psychiatry

## 2020-03-08 DIAGNOSIS — F411 Generalized anxiety disorder: Secondary | ICD-10-CM | POA: Diagnosis not present

## 2020-03-08 NOTE — Progress Notes (Signed)
[facility] Counselor/Therapist Progress Note  Patient ID: Vanessa Doyle, MRN: [id number],    Date: 03/08/2020  Time Spent: 60 minutes  9:00am to 10:00am  Treatment Type: Individual Therapy  Reported Symptoms: anxiety, stressed, but also some positive excitement about heading off to college to continue her education and 4 yr degree.  Mental Status Exam:  Appearance:   Casual     Behavior:  Appropriate, Sharing and Motivated  Motor:  Normal  Speech/Language:   Clear and Coherent  Affect:  anxious  Mood:  anxious  Thought process:  goal directed  Thought content:    WNL  Sensory/Perceptual disturbances:    WNL  Orientation:  oriented to person, place, time/date, situation, day of week, month of year and year  Attention:  Good  Concentration:  Good and Fair  Memory:  some forgetfulness worse under stress  Fund of knowledge:   Good  Insight:    Good and Fair  Judgment:   Good  Impulse Control:  Good   Risk Assessment: Danger to Self:  No Self-injurious Behavior: No Danger to Others: No Duty to Warn:no Physical Aggression / Violence:No  Access to Firearms a concern: No  Gang Involvement:No   Subjective: Patient today reports anxiety and stress. Showing some definite improvement and that is very encouraging to patient.   Interventions: Cognitive Behavioral Therapy, Solution-Oriented/Positive Psychology and Ego-Supportive  Diagnosis:   ICD-10-CM   1. Generalized anxiety disorder  F41.1      Plan: Patient not signing any updates for tx plan on computer screen due to COVID.We altered her long term and short term goals, and strategies a bit to better assess progress or lack of progress.  Treatment Goals: Some goals may remain on tx plan as patient works on them, however progress will be noted each session in "Progress" sections.  Long term goal: Stabilize anxiety level while increasing ability to function on a daily basis, as Evidenced by patient being able  to function daily without feeling her anxiety level impedes her functioning level.[name] term goal: Increase understanding of beliefs and messages that produce worry and amxiety, as Evidenced by patient clearly stating her improved understanding of negative/anxious thought patterns and how they affect how they lead to worry and anxiety.  Strategies: 1)Help patient develop reality-based, positive cognitive messages. 2)Increase dailyuse of positive self-talk.  Progressing: Patient in today with anxiety but also showing more improvement. 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 Her self-talk is becoming more encouraging rather than judgemental or harsh. Has already begun thinking about supportive strategies and skills in handling stress, decision-making, and change that can be helpful to her in setting a positive tone for her going forward. Processes more of her emotions and feelings about her transition to [employer], and is feeling more motivated and ready, in addition to some excitement and pride in what she has accomplished so far, even with the challenges she has faced. Encouraged her to be involved in some of the school activities offered as a way of meeting new people, which she envisions being challenging, although she states she is feeling more confident.   Goal review and progress noted with patient.  Not scheduling another appt yet due to going to college.  To find out her schedule and call later for appt. as  needed.   [name], LCSW

## 2020-03-14 ENCOUNTER — Ambulatory Visit (INDEPENDENT_AMBULATORY_CARE_PROVIDER_SITE_OTHER): Payer: 59 | Admitting: Family Medicine

## 2020-03-14 ENCOUNTER — Other Ambulatory Visit: Payer: Self-pay

## 2020-03-14 ENCOUNTER — Encounter (INDEPENDENT_AMBULATORY_CARE_PROVIDER_SITE_OTHER): Payer: Self-pay | Admitting: Family Medicine

## 2020-03-14 VITALS — BP 108/70 | HR 84 | Temp 98.0°F | Ht 64.0 in | Wt 233.0 lb

## 2020-03-14 DIAGNOSIS — Z9189 Other specified personal risk factors, not elsewhere classified: Secondary | ICD-10-CM | POA: Diagnosis not present

## 2020-03-14 DIAGNOSIS — E8881 Metabolic syndrome: Secondary | ICD-10-CM

## 2020-03-14 DIAGNOSIS — E559 Vitamin D deficiency, unspecified: Secondary | ICD-10-CM

## 2020-03-14 DIAGNOSIS — Z6841 Body Mass Index (BMI) 40.0 and over, adult: Secondary | ICD-10-CM

## 2020-03-15 LAB — HEMOGLOBIN A1C
Est. average glucose Bld gHb Est-mCnc: 111 mg/dL
Hgb A1c MFr Bld: 5.5 % (ref 4.8–5.6)

## 2020-03-15 MED ORDER — VITAMIN D (ERGOCALCIFEROL) 1.25 MG (50000 UNIT) PO CAPS: 50000.0000 [IU] | ORAL_CAPSULE | ORAL | 0 refills | Status: DC

## 2020-03-15 NOTE — Progress Notes (Signed)
Chief Complaint:   OBESITY Vanessa Doyle is here to discuss her progress with her obesity treatment plan along with follow-up of her obesity related diagnoses. Vanessa Doyle is on the Category 2 Plan and states she is following her eating plan approximately 95% of the time. Vanessa Doyle states she is doing barre for 45 minutes 5 times per week.  Today's visit was #: 6 Starting weight: 229 lbs Starting date: 12/07/2019 Today's weight: 233 lbs Today's date: 03/14/2020 Total lbs lost to date: 0 Total lbs lost since last in-office visit: 0  Interim History: Vanessa Doyle was last seen by Dr. Earlene Plater on 02/07/2020, and was told to follow-up in 2-3 weeks.  She has had a lot of stress in her life lately, she says.  She decided in May 2021 that she is going to move to [place].  She is in school full time and works 40 hours per week.  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  She finds the dairy to be too much or heavy on her tummy.  She likes chicken, [place], and seafood.  She denies that she is skipping meals.  Subjective:   1. Vitamin D deficiency Vanessa Doyle's Vitamin D level was 30.5 on 12/07/2019. She is currently taking prescription vitamin D 50,000 IU each week. She denies nausea, vomiting or muscle weakness.  Tolerating vitamin D well without concerns.  She says it has improved her energy levels.  2. Insulin resistance Vanessa Doyle has a diagnosis of insulin resistance based on her elevated fasting insulin level >5. She continues to work on diet and exercise to decrease her risk of diabetes.  Lab Results  Component Value Date   INSULIN 14.0 12/07/2019   Lab Results  Component Value Date   HGBA1C 5.5 03/14/2020   3. At risk for dehydration Vanessa Doyle is at risk for dehydration due to inadequate water intake.  Assessment/Plan:   1. Vitamin D deficiency Low Vitamin D level contributes to fatigue and are associated with obesity, breast, and colon cancer. She agrees to continue to take prescription Vitamin D @50 ,000 IU  every week and will follow-up for routine testing of Vitamin D, at least 2-3 times per year to avoid over-replacement.  Will recheck vitamin D levels in 3-4 months.  Continue prudent nutritional plan and weight loss. - Vitamin D, Ergocalciferol, (DRISDOL) 1.25 MG (50000 UNIT) CAPS capsule; Take 1 capsule (50,000 Units total) by mouth every 7 (seven) days.  Dispense: 4 capsule; Refill: 0  2. Insulin resistance Vanessa Doyle will continue to work on weight loss, exercise, and decreasing simple carbohydrates to help decrease the risk of diabetes. Vanessa Doyle agreed to follow-up with Vanessa Doyle as directed to closely monitor her progress.  Check A1c today.  Continue prudent nutritional plan and weight loss. - Hemoglobin A1c  3. At risk for dehydration Vanessa Doyle was given approximately 15 minutes dehydration prevention counseling today. Vanessa Doyle is at risk for dehydration due to weight loss and current medication(s). She was encouraged to hydrate and monitor fluid status to avoid dehydration as well as weight loss plateaus.   4. Class 3 severe obesity with serious comorbidity and body mass index (BMI) of 40.0 to 44.9 in adult, unspecified obesity type (HCC) Vanessa Doyle is currently in the action stage of change. As such, her goal is to continue with weight loss efforts. She has agreed to the Category 2 Plan.   Exercise goals: As is.  Behavioral modification strategies: increasing lean protein intake, decreasing simple carbohydrates, increasing vegetables, no skipping meals, meal planning and cooking  strategies and planning for success.  Gave handout on hidden calories and stressed the importance of measuring/weighing food.  Vanessa Doyle has agreed to follow-up with our clinic in 2 weeks. She was informed of the importance of frequent follow-up visits to maximize her success with intensive lifestyle modifications for her multiple health conditions.   Objective:   Blood pressure 108/70, pulse 84, temperature 98 F (36.7 C), height 5\' 4"   (1.626 m), weight 233 lb (105.7 kg), SpO2 98 %. Body mass index is 39.99 kg/m.  General: Cooperative, alert, well developed, in no acute distress. HEENT: Conjunctivae and lids unremarkable. Cardiovascular: Regular rhythm.  Lungs: Normal work of breathing. Neurologic: No focal deficits.   Lab Results  Component Value Date   CREATININE 0.72 12/07/2019   BUN 9 12/07/2019   NA 139 12/07/2019   K 4.2 12/07/2019   CL 102 12/07/2019   CO2 21 12/07/2019   Lab Results  Component Value Date   ALT 66 (H) 12/07/2019   AST 24 12/07/2019   ALKPHOS 95 12/07/2019   BILITOT 0.3 12/07/2019   Lab Results  Component Value Date   HGBA1C 5.5 03/14/2020   HGBA1C 5.6 12/07/2019   Lab Results  Component Value Date   INSULIN 14.0 12/07/2019   Lab Results  Component Value Date   TSH 0.788 12/07/2019   Lab Results  Component Value Date   CHOL 136 12/07/2019   HDL 55 12/07/2019   LDLCALC 67 12/07/2019   TRIG 67 12/07/2019   Lab Results  Component Value Date   WBC 5.7 12/07/2019   HGB 13.0 12/07/2019   HCT 39.8 12/07/2019   MCV 86 12/07/2019   PLT 285 12/07/2019   Attestation Statements:   Reviewed by clinician on day of visit: allergies, medications, problem list, medical history, surgical history, family history, social history, and previous encounter notes.  I, 12/09/2019, CMA, am acting as Insurance claims handler for Energy manager, DO.  I have reviewed the above documentation for accuracy and completeness, and I agree with the above. [employer], DO

## 2020-03-21 NOTE — Progress Notes (Signed)
Office: [phone]  /  Fax: [phone]    Date: April 04, 2020   Appointment Start Time: 7:58am Duration: 20 minutes Provider: Lawerance Cruel, Psy.D. Type of Session: Individual Therapy  Location of Patient: Home Location of Provider: Provider's Home Type of Contact: Telepsychological Visit via MyChart Video Visit  Session Content: Vanessa Doyle is a 19 y.o. female presenting for a follow-up appointment to address the previously established treatment goal of increasing coping skills. Today's appointment was a telepsychological visit due to COVID-19. Vanessa Doyle provided verbal consent for today's telepsychological appointment and she is aware she is responsible for securing confidentiality on her end of the session. Prior to proceeding with today's appointment, Vanessa Doyle's physical location at the time of this appointment was obtained as well a phone number she could be reached at in the event of technical difficulties. Vanessa Doyle and this provider participated in today's telepsychological service.   This provider conducted a brief check-in and verbally administered the PHQ-9 and GAD-7. Vanessa Doyle stated, "The move went well. I love my apartment." She indicated she started classes. Since moving, Vanessa Doyle reported she feels more control in regard to what she eats. A plan was developed to help Vanessa Doyle cope with emotional eating in the future using learned skills. She wrote down the following plan: focus on hydration; be prepared with snacks congruent to the meal plan; pause to ask questions when triggered to eat (e.g., Am I really hungry?, Is there something bothering me?, and Will I feel better if I eat?); and engage in discussed coping strategies after going through the aforementioned questions. [name] was receptive to today's appointment as evidenced by openness to sharing, responsiveness to feedback, and willingness to continue engaging in learned skills.  Mental Status Examination:  Appearance: well groomed and appropriate  hygiene  Behavior: appropriate to circumstances Mood: euthymic Affect: mood congruent Speech: normal in rate, volume, and tone Eye Contact: appropriate Psychomotor Activity: appropriate Gait: unable to assess Thought Process: linear, logical, and goal directed  Thought Content/Perception: no hallucinations, delusions, bizarre thinking or behavior reported or observed and no evidence of suicidal and homicidal ideation, plan, and intent Orientation: time, person, place, and purpose of appointment Memory/Concentration: memory, attention, language, and fund of knowledge intact  Insight/Judgment: good  Structured Assessments Results: The Patient Health Questionnaire-9 (PHQ-9) is a self-report measure that assesses symptoms and severity of depression over the course of the last two weeks. Vanessa Doyle obtained a score of 1 suggesting minimal depression. Vanessa Doyle finds the endorsed symptoms to be somewhat difficult. [0= Not at all; 1= Several days; 2= More than half the days; 3= Nearly every day] Little interest or pleasure in doing things 0  Feeling down, depressed, or hopeless 0  Trouble falling or staying asleep, or sleeping too much 1  Feeling tired or having little energy 0  Poor appetite or overeating 0  Feeling bad about yourself --- or that you are a failure or have let yourself or your family down 0  Trouble concentrating on things, such as reading the newspaper or watching television 0  Moving or speaking so slowly that other people could have noticed? Or the opposite --- being so fidgety or restless that you have been moving around a lot more than usual 0  Thoughts that you would be better off dead or hurting yourself in some way 0  PHQ-9 Score 1    The Generalized Anxiety Disorder-7 (GAD-7) is a brief self-report measure that assesses symptoms of anxiety over the course of the last two weeks. Vanessa Doyle obtained  a score of 2 suggesting minimal anxiety. Vanessa Doyle finds the endorsed symptoms to be  somewhat difficult. [0= Not at all; 1= Several days; 2= Over half the days; 3= Nearly every day] Feeling nervous, anxious, on edge 0  Not being able to stop or control worrying 1  Worrying too much about different things 1  Trouble relaxing 0  Being so restless that it's hard to sit still 0  Becoming easily annoyed or irritable 0  Feeling afraid as if something awful might happen 0  GAD-7 Score 2   Interventions:  Conducted a brief chart review Verbally administered PHQ-9 and GAD-7 for symptom monitoring Provided empathic reflections and validation Employed supportive psychotherapy interventions to facilitate reduced distress and to improve coping skills with identified stressors Reviewed learned skills  DSM-5 Diagnosis(es): 300.02 (F41.1) Generalized Anxiety Disorder  Treatment Goal & Progress: During the initial appointment with this provider, the following treatment goal was established: increase coping skills. Vanessa Doyle demonstrated progress in her goal as evidenced by increased awareness of hunger patterns, increased awareness of triggers for emotional eating and reduction in emotional eating. Vanessa Doyle also continues to demonstrate willingness to engage in learned skill(s).  Plan: As previously planned, today was Vanessa Doyle's last appointment with this provider. She acknowledged understanding that she may request a follow-up appointment with this provider in the future as long as she is still established with the clinic. No further follow-up planned by this provider.

## 2020-04-04 ENCOUNTER — Telehealth (INDEPENDENT_AMBULATORY_CARE_PROVIDER_SITE_OTHER): Payer: 59 | Admitting: Psychology

## 2020-04-04 ENCOUNTER — Other Ambulatory Visit: Payer: Self-pay

## 2020-04-04 DIAGNOSIS — F411 Generalized anxiety disorder: Secondary | ICD-10-CM

## 2020-04-30 ENCOUNTER — Ambulatory Visit: Payer: 59 | Admitting: Psychiatry

## 2020-05-14 ENCOUNTER — Ambulatory Visit (INDEPENDENT_AMBULATORY_CARE_PROVIDER_SITE_OTHER): Payer: 59 | Admitting: Psychiatry

## 2020-05-14 DIAGNOSIS — F411 Generalized anxiety disorder: Secondary | ICD-10-CM | POA: Diagnosis not present

## 2020-05-14 NOTE — Progress Notes (Signed)
[facility] Counselor/Therapist Progress Note  Patient ID: Vanessa Doyle, MRN: [id number],    Date: 05/14/2020  Time Spent: 60 minutes  7:55am to 8:55am  Virtual Visit Note via Programmer, systems with patient by a video enabled telemedicine/telehealth application or telephone, with their informed consent, and verified patient privacy and that I am speaking with the correct person using two identifiers. I discussed the limitations, risks, security and privacy concerns of performing psychotherapy and management service by telephone and the availability of in person appointments. I also discussed with the patient that there may be a patient responsible charge related to this service. The patient expressed understanding and agreed to proceed. I discussed the treatment planning with the patient. The patient was provided an opportunity to ask questions and all were answered. The patient agreed with the plan and demonstrated an understanding of the instructions. The patient was advised to call  our office if  symptoms worsen or feel they are in a crisis state and need immediate contact.   Therapist Location: [facility] Patient Location: home  Treatment Type: Individual Therapy  Reported Symptoms: anxiety, stressed  Mental Status Exam:  Appearance:   n/a   telehealth  (per video, is casual)  Behavior:  Appropriate and Sharing  Motor:  WNL  Speech/Language:   Clear and Coherent  Affect:  anxious  Mood:  anxious and stressed  Thought process:  goal directed  Thought content:    obsessiveness "at times"  Sensory/Perceptual disturbances:    WNL  Orientation:  oriented to person, place, time/date, situation, day of week, month of year and year  Attention:  Good  Concentration:  Good and Fair  Memory:  WNL  Fund of knowledge:   Good  Insight:    Good and Fair  Judgment:   Good  Impulse Control:  Fair   Risk Assessment: Danger to Self:  No Self-injurious Behavior:  No Danger to Others: No Duty to Warn:no Physical Aggression / Violence:No  Access to Firearms a concern: No  Gang Involvement:No   Subjective: Patient today reports anxiety and feeling stressed as 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 Feels she has improved her self-talk to be a little more positive and her stress level is "not quite as bad".  Encouraged.   Interventions: Cognitive Behavioral Therapy, Solution-Oriented/Positive Psychology and Ego-Supportive  Diagnosis:   ICD-10-CM   1. Generalized anxiety disorder  F41.1     Plan: Patient not signing any updates for tx plan on computer screen due to [place].We altered her long term and short term goals, and strategies a bit to better assess progress or lack of progress.  Treatment Goals: Some goals may remain on tx plan as patient works on them, however progress will be noted each session in "Progress" sections.  Long term goal: Stabilize anxiety level while increasing ability to function on a daily basis, as Evidenced by patient being able to function daily without feeling her anxiety level impedes her functioning level.Sheran Fava term goal: Increase understanding of beliefs and messages that produce worry and amxiety, as Evidenced by patient clearly stating her improved understanding of negative/anxious thought patterns and how they affect how they lead to worry and anxiety.  Strategies: 1)Help patient develop reality-based, positive cognitive messages. 2)Increase dailyuse of positive self-talk.  Progressing: Patient reports anxiety and feeling stressed.  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 Notes she has improved some in self-talk being more positive  and overall stress level has decreased some.  States she is adjusting to new school gradually and experiencing some stressors that so far she is managing some better with a couple situations that have been more problematic but "overall  doing some better." Is having some anxiety re: credits transferring from prior school and not sure she has accurate info so is following up to get more information on that, and trying "not to assume the worst which is hard for me."  Worked with her on her short term goal above in (tx plan) better understanding the beliefs/messages that lead to her anxiety as well as review of strategies in tx plan to support her continued work of improving self-talk which is very important for patient's progress. Has not met many other students yet but is starting to meet some that are in her classes. Encouraged good self-care including positive self-talk, physical activity, making efforts to meet more friends on campus and within her apt building, staying in the present versus past or future, and looking for more positives than negatives.   Goal review and progress/challenges noted with patient.  Next appt within 3-4 weeks to help maintain her gains and continued work on her goals.   Shanon Ace, LCSW

## 2020-05-29 ENCOUNTER — Ambulatory Visit (INDEPENDENT_AMBULATORY_CARE_PROVIDER_SITE_OTHER): Payer: 59 | Admitting: Psychiatry

## 2020-05-29 DIAGNOSIS — F411 Generalized anxiety disorder: Secondary | ICD-10-CM | POA: Diagnosis not present

## 2020-05-29 NOTE — Progress Notes (Signed)
[facility] Counselor/Therapist Progress Note  Patient ID: KALY MCQUARY, MRN: [id number],    Date: 05/29/2020  Time Spent: 45 minutes  8:10am to 8:55am  Virtual Visit Note via MyChart Video Connected with patient by a video enabled telemedicine/telehealth application or telephone, with their informed consent, and verified patient privacy and that I am speaking with the correct person using two identifiers. I discussed the limitations, risks, security and privacy concerns of performing psychotherapy and management service by telephone and the availability of in person appointments. I also discussed with the patient that there may be a patient responsible charge related to this service. The patient expressed understanding and agreed to proceed. I discussed the treatment planning with the patient. The patient was provided an opportunity to ask questions and all were answered. The patient agreed with the plan and demonstrated an understanding of the instructions. The patient was advised to call  our office if  symptoms worsen or feel they are in a crisis state and need immediate contact.   Therapist Location: [facility] Patient Location: home   Treatment Type: Individual Therapy  Reported Symptoms: anxious, stressed, depressed  Mental Status Exam:  Appearance:   Casual     Behavior:  Appropriate, Sharing and Motivated  Motor:  Normal  Speech/Language:   Clear and Coherent  Affect:  anxious, some depression  Mood:  anxious and depressed  Thought process:  goal directed  Thought content:    some obsessiveness  Sensory/Perceptual disturbances:    WNL  Orientation:  oriented to person, place, time/date, situation, day of week, month of year and year  Attention:  Good  Concentration:  Fair  Memory:  WNL  Fund of knowledge:   Good  Insight:    Good  Judgment:   Good  Impulse Control:  Good   Risk Assessment: Danger to Self:  No Self-injurious Behavior: No Danger  to Others: No Duty to Warn:no Physical Aggression / Violence:No  Access to Firearms a concern: No  Gang Involvement:No   Subjective: Patient today reports anxiety and some depression more recently.  She feels she has managed better at times but overall "not so good due to some things happening recently."   Interventions: Cognitive Behavioral Therapy, Solution-Oriented/Positive Psychology and Ego-Supportive  Diagnosis:   ICD-10-CM   1. Generalized anxiety disorder  F41.1      Plan: Patient not signing any updates for tx plan on computer screen due to COVID.We altered her long term and short term goals, and strategies a bit to better assess progress or lack of progress.  Treatment Goals: Some goals may remain on tx plan as patient works on them, however progress will be noted each session in "Progress" sections.  Long term goal: Stabilize anxiety level while increasing ability to function on a daily basis, as Evidenced by patient being able to function daily without feeling her anxiety level impedes her functioning level.[name] term goal: Increase understanding of beliefs and messages that produce worry and amxiety, as Evidenced by patient clearly stating her improved understanding of negative/anxious thought patterns and how they affect how they lead to worry and anxiety.  Strategies: 1)Help patient develop reality-based, positive cognitive messages. 2)Increase dailyuse of positive self-talk.  Progressing: Patient today reporting anxiety and some depression more recently. Mom was in a wreck "caused by deer trying to cross road" and the following week her brother was in a wreck caused by another driver.  Both mom and brother are both ok  physically.  Has been struggling with a project she did at school and it "turned out badly", leaving patient feeling embarassed, depressed, anxious, and frustrated at herself. Also found out that she needing more general education classes  in order for her transfer to [employer] to go through smoothly and her graduated with 4 yr degree within 2 1/2 yrs, when she thought it would be within 2 years.  Is beginning to accept this some in order to move forward.  Processed her feelings of frustration, anxiety, depression, and embarassment during session and plans to work further on "letting go" based on therapeutic strategies she's used previously with success. Encouraged good self-care including her work on "letting go of the negatives" and focusing more on the positives, staying in touch with supportive people,  remaining in the present versus the past or the future, and making her self-talk more positive while not letting negative things define her.  Goal review and progress/challenges noted with patient.  Next appt within 2-3 weeks.   [name], LCSW

## 2020-05-30 ENCOUNTER — Encounter: Payer: Self-pay | Admitting: Psychiatry

## 2020-06-18 ENCOUNTER — Telehealth (INDEPENDENT_AMBULATORY_CARE_PROVIDER_SITE_OTHER): Payer: 59 | Admitting: Psychiatry

## 2020-06-18 DIAGNOSIS — F411 Generalized anxiety disorder: Secondary | ICD-10-CM

## 2020-06-18 NOTE — Progress Notes (Signed)
[facility] Counselor/Therapist Progress Note  Patient ID: Vanessa Doyle, MRN: [id number],    Date: 06/18/2020  Time Spent: 60 minutes   8:00am to 9:00am  Virtual Visit Note via Copywriter, advertising with patient by a video enabled telemedicine/telehealth application or telephone, with their informed consent, and verified patient privacy and that I am speaking with the correct person using two identifiers. I discussed the limitations, risks, security and privacy concerns of performing psychotherapy and management service by telephone and the availability of in person appointments. I also discussed with the patient that there may be a patient responsible charge related to this service. The patient expressed understanding and agreed to proceed. I discussed the treatment planning with the patient. The patient was provided an opportunity to ask questions and all were answered. The patient agreed with the plan and demonstrated an understanding of the instructions. The patient was advised to call  our office if  symptoms worsen or feel they are in a crisis state and need immediate contact.   Therapist Location: [facility] Patient Location: home   Treatment Type: Individual Therapy  Reported Symptoms: anxiety, stressed, trying to get more motivated  Mental Status Exam:  Appearance:   Casual     Behavior:  Appropriate and Sharing  Motor:  Normal  Speech/Language:   Clear and Coherent  Affect:  anxious  Mood:  anxious  Thought process:  goal directed  Thought content:    WNL  Sensory/Perceptual disturbances:    WNL  Orientation:  oriented to person, place, time/date, situation, day of week, month of year and year  Attention:  Good  Concentration:  Good  Memory:  WNL  Fund of knowledge:   Good  Insight:    Good  Judgment:   Good  Impulse Control:  Good   Risk Assessment: Danger to Self:  No Self-injurious Behavior: No Danger to Others: No Duty to  Warn:no Physical Aggression / Violence:No  Access to Firearms a concern: No  Gang Involvement:No   Subjective:  Patient today reports anxiety, some overwhelmedness, stressed.  Trying to be more motivated and not as overwhelmed, and states she does sometimes reach a point of not being as overwhelmed.  Wanting to feel more productive with her academic work.  Interventions: Cognitive Behavioral Therapy and Solution-Oriented/Positive Psychology  Diagnosis:   ICD-10-CM   1. Generalized anxiety disorder  F41.1      Plan: Patient not signing any updates for tx plan on computer screen due to COVID.We altered her long term and short term goals, and strategies a bit to better assess progress or lack of progress.  Treatment Goals: Some goals may remain on tx plan as patient works on them, however progress will be noted each session in "Progress" sections.  Long term goal: Stabilize anxiety level while increasing ability to function on a daily basis, as Evidenced by patient being able to function daily without feeling her anxiety level impedes her functioning level.[name] term goal: Increase understanding of beliefs and messages that produce worry and amxiety, as Evidenced by patient clearly stating her improved understanding of negative/anxious thought patterns and how they affect how they lead to worry and anxiety.  Strategies: 1)Help patient develop reality-based, positive cognitive messages. 2)Increase dailyuse of positive self-talk.  Progressing: Patient today reporting anxiety, overwhelmedness (mostly with her college academics), stressed, but trying to balance life out more. Talked through a lot of her school-related stressors in session, and worked on how to set  better boundaries re: getting all her assignments done on time, saying "no" to others when needed and not feel guilty, setting limits with others and sticking to them, and taking better care of herself and her  needs/priorities a she tries to make better decisions. Rates her anxiety on a 1-10 scale today as a "5" and feeling some more self-confident, which is encouraging for her.  Worked with her short term goal and strategies in Tx Plan above during session today to use more current examples of her anxious thoughts and challenging them to replace with more positive, reality-based, and empowering thoughts that do not support anxiety.  She is to continue working on this and improving her self-talk to be more positive, between now and next session. Encouraged patient to practice good self-care including the strategies cited above and also getting outside daily, some exercise or walking daily, healthy nutrition, staying in contact with supportive people, setting boundaries, look more for positive than negatives, and staying in the present versus past or future.   Goal review and progress/challenges noted with patient.  Next appt within 3 weeks.   [name], LCSW

## 2020-07-23 ENCOUNTER — Other Ambulatory Visit: Payer: Self-pay

## 2020-07-23 ENCOUNTER — Ambulatory Visit (INDEPENDENT_AMBULATORY_CARE_PROVIDER_SITE_OTHER): Payer: 59 | Admitting: Psychiatry

## 2020-07-23 DIAGNOSIS — F411 Generalized anxiety disorder: Secondary | ICD-10-CM | POA: Diagnosis not present

## 2020-07-23 NOTE — Progress Notes (Addendum)
[facility] Counselor/Therapist Progress Note  Patient ID: Vanessa Doyle, MRN: [id number],    Date: 07/23/2020  Time Spent: 60 minutes    7:58am to 8:58am  Treatment Type: Individual Therapy  Reported Symptoms: anxiety, stressed but is "making progress and not letting anxiety control my life quite as much, need to keep working at it."  Mental Status Exam:  Appearance:   Casual     Behavior:  Appropriate, Sharing and Motivated  Motor:  Normal  Speech/Language:   Clear and Coherent  Affect:  anxious and stressed with school semester ending and heading into next semester  Mood:  anxious  Thought process:  goal directed  Thought content:    WNL  Sensory/Perceptual disturbances:    WNL  Orientation:  oriented to person, place, time/date, situation, day of week, month of year and year  Attention:  Good  Concentration:  Good and Fair  Memory:  WNL  Fund of knowledge:   Good  Insight:    Good  Judgment:   Good and Fair  Impulse Control:  Good and Fair   Risk Assessment: Danger to Self:  No Self-injurious Behavior: No Danger to Others: No Duty to Warn:no Physical Aggression / Violence:No  Access to Firearms a concern: No  Gang Involvement:No   Subjective: Patient today reports anxiety but also noticing some improvement she is making.   Interventions: Cognitive Behavioral Therapy and Solution-Oriented/Positive Psychology  Diagnosis:   ICD-10-CM   1. Generalized anxiety disorder  F41.1     Plan: Patient not signing any updates for tx plan on computer screen due to COVID.We altered her long term and short term goals, and strategies a bit to better assess progress or lack of progress.  Treatment Goals: Some goals may remain on tx plan as patient works on them, however progress will be noted each session in "Progress" sections.  Long term goal: Stabilize anxiety level while increasing ability to function on a daily basis, as Evidenced by patient being able to  function daily without feeling her anxiety level impedes her functioning level.[name] term goal: Increase understanding of beliefs and messages that produce worry and amxiety, as Evidenced by patient clearly stating her improved understanding of negative/anxious thought patterns and how they affect how they lead to worry and anxiety.  Strategies: 1)Help patient develop reality-based, positive cognitive messages. 2)Increase dailyuse of positive self-talk.  Progressing: Patient in today reporting continued anxieties and frustration but also acknowledging progress she is making. Realizing that it has helped her mature and grow by living out on her own in apartment at [employer]. Some challenges that she had to work out and some she is still working out. Is trying to get back into exercise using a program that is online. Decrease in her feelings of overwhelmedness, and at this point her overwhelmedness is more episodic and not chronic. Learning to depend on herself and is working to believe more in herself and "what do I want to do in the future." Less guilt. Discussed anxiety management, setting limits and boundaries, meeting deadlines with school/work/other areas, improving self confident, better decision making, and overall better life balance. Is a 3/4 on 1-10 scale of anixiety today.  Encouraged patient in re-starting her exercise, stay hydrated (esp due to past issues), set limits that are reasonable, get good sleep, stay in present, focus on what she can control versus can't, healthy nutrition, keep working to make self talk more positive and encouraging.  Goal  review and progress/challenges noted with patients.  Next appt within 3-4 weeks.   [name], LCSW

## 2020-12-13 ENCOUNTER — Encounter (INDEPENDENT_AMBULATORY_CARE_PROVIDER_SITE_OTHER): Payer: Self-pay

## 2022-03-17 ENCOUNTER — Ambulatory Visit: Payer: 59 | Admitting: Psychiatry

## 2022-03-17 DIAGNOSIS — F411 Generalized anxiety disorder: Secondary | ICD-10-CM

## 2022-03-17 NOTE — Progress Notes (Signed)
[facility] Counselor/Therapist Progress Note  Patient ID: Vanessa Doyle, MRN: [id number],    Date: 03/17/2022  Time Spent: 55 minutes   Treatment Type: Individual Therapy  Reported Symptoms: anxiety, depression  Mental Status Exam:  Appearance:   Neat     Behavior:  Appropriate, Sharing, and Motivated  Motor:  Normal  Speech/Language:   Clear and Coherent  Affect:  anxiety  Mood:  anxious and depressed  Thought process:  goal directed  Thought content:    Obssessive thoughts and overthinking  Sensory/Perceptual disturbances:    WNL  Orientation:  oriented to person, place, time/date, situation, day of week, month of year, year, and stated date of March 17, 2022  Attention:  Good  Concentration:  Good and Fair  Memory:  WNL  Fund of knowledge:   Good  Insight:    Good  Judgment:   Good  Impulse Control:  Good and Fair   Risk Assessment: Danger to Self:  No Self-injurious Behavior: No Danger to Others: No Duty to Warn:no Physical Aggression / Violence:No  Access to Firearms a concern: No  Gang Involvement:No   Subjective:  Patient in today after having made progress in earlier times of her treatment.  Has been away at college and that has been a good experience for her. States "life has happened and I knew I needed to get back in for therapy." Grandmother, 106 yr old, with whom patient is very close, was diagnosed with leukemia, "and I was just blindsided". Is responding some to treatments. Grandfather (her husband) has also been in and out of hospital for various concerns. Patient has had long close relationship with grandparents. Dad broke heel bone in May but is continuing to heal and using crutches. Brother getting married next month and patient involved with some "issues that are going on with that". Feeling overwhelmed and "I'm getting irritable with other." Needing to take some time for herself and better self-care. Made a list of some things that can help her be  more limit-setting with healthier boundaries as well as behaviors/actions that can help patient feel more grounded emotionally. Reviewed stress and anxiety management strategies from prior sessions. Looked at ways of better balancing her responsibilities personally and to help out with other family members.   Interventions: Cognitive Behavioral Therapy  Treatment Goals: Some goals may remain on tx plan as patient works on them, however progress will be noted each session in "Progress" sections. Long term goal: Stabilize anxiety level while increasing ability to function on a daily basis, as Evidenced by patient being able to function daily without feeling her anxiety level impedes her functioning level.[name] term goal: Increase understanding of beliefs and messages that produce worry and amxiety, as Evidenced by patient clearly stating her improved understanding of negative/anxious thought patterns and how they affect how they lead to worry and anxiety. Strategies: 1)Help patient develop reality-based, positive cognitive messages. 2)Increase daily use of positive self-talk.  Diagnosis:   ICD-10-CM   1. Generalized anxiety disorder  F41.1      Plan:  Patient in today showing good motivation and participation as patient processed as noted above a lot of family and personal stressors which have led to increased anxiety due to no healthy boundaries, feeling she had to say "yes" to everyone was asking her to do something. Practiced some scenarios in session today which seemed helpful to patient, especially in her realizing that boundary setting is very necessary for her and is  not a negative behavior and can actually preserve and enrich relationships versus actively impacting them which was her concern and feeling that she could not say "no" to others especially within the family.  Encouraged her in her practice of positive behaviors including: Practicing saying "no" to others as needed, recognize  and emphasize her strengths, staying in the present focusing on what she can change or control, getting outside some daily and walking, staying in touch with people who are supportive, looking for more positives versus negatives daily, healthy nutrition and exercise, refrain from assuming negatives and worst case scenarios, practice consistent positive self talk, challenge and counteract her self doubt, interrupt negative/anxious thoughts and challenge them to replace with more reality based and empowering thoughts, saying no without feeling guilty or pressured, continue to work on some perfectionistic issues, and realize the strength she shows working with goal directed behaviors to move in a direction that supports her increased confidence, improved emotional health, and overall wellbeing.  Goal review and progress/challenges noted with patient.  Next appointment within approximately 4 weeks.  This record has been created using [employer].  Chart creation errors have been sought, but may not always have been located and corrected.  Such creation errors do not reflect on the standard of medical care provided.   [name], LCSW

## 2022-03-19 ENCOUNTER — Encounter (INDEPENDENT_AMBULATORY_CARE_PROVIDER_SITE_OTHER): Payer: Self-pay

## 2022-04-11 ENCOUNTER — Ambulatory Visit: Payer: 59 | Admitting: Psychiatry

## 2022-04-11 DIAGNOSIS — F411 Generalized anxiety disorder: Secondary | ICD-10-CM | POA: Diagnosis not present

## 2022-04-11 NOTE — Progress Notes (Signed)
[facility] Counselor/Therapist Progress Note  Patient ID: Vanessa Doyle, MRN: [id number],    Date: 04/11/2022  Time Spent: 55 minutes   Treatment Type: Individual Therapy  Reported Symptoms: anxiety, stressed, some depression  Mental Status Exam:  Appearance:   Neat     Behavior:  Appropriate, Sharing, and Motivated  Motor:  Normal  Speech/Language:   Clear and Coherent  Affect:  anxious  Mood:  anxious  Thought process:  goal directed  Thought content:    overthinking  Sensory/Perceptual disturbances:    WNL  Orientation:  oriented to person, place, time/date, situation, day of week, month of year, year, and stated date of Sept. 1, 2023  Attention:  Good  Concentration:  Fair  Memory:  WNL  Fund of knowledge:   Good  Insight:    Good  Judgment:   Good  Impulse Control:  Good   Risk Assessment: Danger to Self:  No Self-injurious Behavior: No Danger to Others: No Duty to Warn:no Physical Aggression / Violence:No  Access to Firearms a concern: No  Gang Involvement:No   Subjective:   Patient in today reporting anxiety, stressed, and some depression "but anxiety is the strongest". Significant family concerns (health and interpersonal). Patient trying to intentionally manage stress better as she is in her final semester at [employer]. Grandmother responding some better with her treatments for leukemia. Has been worried about g'mother and also her dad with a severely broken heel and he is improving. Brother's wedding to be in late September 2023, and she discusses more today related stressors within family, and she's understanding how she can't really control everything "much better than I used to." Encountering some relationship challenges within family, and discussed this today, looking at some ways of relating and communicating in challenging situations. Self confidence improving. Stress management improving.   Interventions: Cognitive Behavioral Therapy and  Ego-Supportive  Treatment Goals: Some goals may remain on tx plan as patient works on them, however progress will be noted each session in "Progress" sections. Long term goal: Stabilize anxiety level while increasing ability to function on a daily basis, as Evidenced by patient being able to function daily without feeling her anxiety level impedes her functioning level.Vanessa Doyle term goal: Increase understanding of beliefs and messages that produce worry and amxiety, as Evidenced by patient clearly stating her improved understanding of negative/anxious thought patterns and how they affect how they lead to worry and anxiety. Strategies: 1)Help patient develop reality-based, positive cognitive messages. 2)Increase daily use of positive self-talk.  Diagnosis:   ICD-10-CM   1. Generalized anxiety disorder  F41.1      Plan: Patient today showing good participation and motivation as she focused on her anxiety primarily and some family and school stressors.  Is showing more belief in herself very gradually, and also able to modulate her stress reactions better at times.  Showing some strength in her self-esteem, even though she still doubts herself at times, the doubt is not as strong as it was for a long time previously.  Smiling more and recognizing ways of dealing with difficult situations more readily at times.  Encouraged to maintain healthier boundaries and knows that she does not always have to say "yes" to everything and everybody which has been an issue that she has struggled with.  Is feeling more pride in the fact she will be graduating at the end of the semester.  Encouraged Vanessa Doyle in her practice of positive behaviors including: Saying  no when she needs to say no, recognize and emphasize her strengths, stay in the present focusing on what she can change or control, getting outside some daily and walking, staying in touch with people who are supportive, looking for more positives versus negatives  daily, healthy nutrition and exercise, refrain from assuming negatives and worst case scenarios, practice consistent positive self talk, challenge and counteract her self doubt, continue to work on perfectionistic issues, and recognize the strengths she shows working with goal directed behaviors to move in a direction that supports her improved emotional health.  Goal review and progress/challenges noted with patient.  Next appointment within and 3 weeks.  This record has been created using [employer].  Chart creation errors have been sought, but may not always have been located and corrected.  Such creation errors do not reflect on the standard of medical care provided.   [name], LCSW

## 2022-05-09 ENCOUNTER — Ambulatory Visit: Payer: 59 | Admitting: Psychiatry

## 2022-05-09 DIAGNOSIS — F411 Generalized anxiety disorder: Secondary | ICD-10-CM | POA: Diagnosis not present

## 2022-05-09 NOTE — Progress Notes (Signed)
[facility] Counselor/Therapist Progress Note  Patient ID: Vanessa Doyle, MRN: [id number],    Date: 05/09/2022  Time Spent: 55 minutes   Treatment Type: Individual Therapy  Reported Symptoms: anxiety, depression,stressed  Mental Status Exam:  Appearance:   Well Groomed     Behavior:  Appropriate, Sharing, and Motivated  Motor:  Normal  Speech/Language:   Clear and Coherent  Affect:  Anxious, some depression  Mood:  anxious and depressed  Thought process:  goal directed  Thought content:    overthinking  Sensory/Perceptual disturbances:    WNL  Orientation:  oriented to person, place, time/date, situation, day of week, month of year, year, and stated date of Sept. 29, 2023  Attention:  Good/Fair  Concentration:  Good  Memory:  WNL  Fund of knowledge:   Good  Insight:    Good and Fair  Judgment:   Good  Impulse Control:  Good   Risk Assessment: Danger to Self:  No Self-injurious Behavior: No Danger to Others: No Duty to Warn:no Physical Aggression / Violence:No  Access to Firearms a concern: No  Gang Involvement:No   Subjective: Patient in today reporting anxiety (stronger symptom), some depression, and stressed. Depression she reports is linked to her getting overwhelmed at times with demands on her time with school, personal, and family past relationship issues. Brother getting married this weekend and patient feeling stress over multiple issues related to that. Needed session today to focus on and work through some personal and family issues of stress and conflict creating strained relationships. (Not all details included in this note due to patient privacy needs.")  Patient intentional in her efforts to manage stress and think before speaking which was helpful for her and in talking through situations. Showing good progress even in the midst of high stress. Encouraged patient in her coping skills discussed today and to keep believing more in herself, realizing and  reflecting on the progress she has made.  Self-confidence and stress management is improving but being tested with all the current issues going on for patient.  Interventions: Cognitive Behavioral Therapy and Ego-Supportive  Treatment Goals: Some goals may remain on tx plan as patient works on them, however progress will be noted each session in "Progress" sections. Long term goal: Stabilize anxiety level while increasing ability to function on a daily basis, as Evidenced by patient being able to function daily without feeling her anxiety level impedes her functioning level.Sheran Fava term goal: Increase understanding of beliefs and messages that produce worry and amxiety, as Evidenced by patient clearly stating her improved understanding of negative/anxious thought patterns and how they affect how they lead to worry and anxiety. Strategies: 1)Help patient develop reality-based, positive cognitive messages. 2)Increase daily use of positive self-talk.  Diagnosis:   ICD-10-CM   1. Generalized anxiety disorder  F41.1      Plan:  Patient in today showing good participation and motivation focusing on her anxiety related to personal and family stressors/conflict which is heightened more recently.  Patient showing some good growth as she handles some of these situations more effectively and not letting herself get manipulated and pulled in different directions, but rather staying focused and sticking with what she feels to be the best for her, and trying to maintain healthy interactions with all involved.  Brother's wedding is also coming up which adds to the overall stress.  Patient, although quite stressed and working through a lot of that today in session, is also showing significant  personal growth and maturity.  Continues to follow through with goal directed behaviors, paying attention to her progress and also her challenges.  Self-esteem is improving.  Working to have healthier boundaries within  family and beyond and realizing she does not have to say "yes" to everything and everybody.  Feeling good about her upcoming college graduation at the end of the semester. Encouraged patient in practicing more positive behaviors including: Recognizing and emphasizing her strengths, staying in the present focusing on what she can change or control, saying no when she needs to say no, getting outside some daily and walking, staying in touch with people who are supportive, looking for more positives versus negatives each day, refrain from assuming negatives and worst case scenarios, healthy nutrition and exercise, practicing consistent positive self talk, challenge and counteract her self doubt, continue working on perfectionistic issues, and realize the strength she shows working with goal directed behaviors to move in a direction that supports her improved emotional health and self-confidence.  Goal review and progress/challenges noted with patient.  Next appointment within 2-3 weeks.  This record has been created using [employer].  Chart creation errors have been sought, but may not always have been located and corrected.  Such creation errors do not reflect on the standard of medical care provided.   Shanon Ace, LCSW

## 2022-05-23 ENCOUNTER — Ambulatory Visit (INDEPENDENT_AMBULATORY_CARE_PROVIDER_SITE_OTHER): Payer: 59 | Admitting: Psychiatry

## 2022-05-23 DIAGNOSIS — F411 Generalized anxiety disorder: Secondary | ICD-10-CM

## 2022-05-23 NOTE — Progress Notes (Deleted)
[facility] Counselor/Therapist Progress Note  Patient ID: Vanessa Doyle, MRN: [id number],    Date: 05/23/2022  Time Spent: ***   Treatment Type: {[facility] AMB THERAPY [phone]}  Reported Symptoms: ***  Mental Status Exam:  Appearance:   {PSY:22683}     Behavior:  {PSY:21022743}  Motor:  {PSY:22302}  Speech/Language:   {PSY:22685}  Affect:  {PSY:22687}  Mood:  {PSY:31886}  Thought process:  {PSY:31888}  Thought content:    {[phone]}  Sensory/Perceptual disturbances:    {PSY:[phone]}  Orientation:  {PSY:30297}  Attention:  {PSY:22877}  Concentration:  {PSY:8255256058}  Memory:  {[phone]}  Fund of knowledge:   {PSY:8255256058}  Insight:    {PSY:8255256058}  Judgment:   {PSY:[id number]}  Impulse Control:  {PSY:[id number]}   Risk Assessment: Danger to Self:  {PSY:22692} Self-injurious Behavior: {PSY:22692} Danger to Others: {PSY:22692} Duty to Warn:{PSY:311194} Physical Aggression / Violence:{PSY:21197} Access to Firearms a concern: {PSY:21197} Gang Involvement:{PSY:21197}  Subjective: ***   Patient in today reporting anxiety    Patient in today reporting anxiety (stronger symptom), some depression, and stressed. Depression she reports is linked to her getting overwhelmed at times with demands on her time with school, personal, and family past relationship issues. Brother getting married this weekend and patient feeling stress over multiple issues related to that. Needed session today to focus on and work through some personal and family issues of stress and conflict creating strained relationships. (Not all details included in this note due to patient privacy needs.")  Patient intentional in her efforts to manage stress and think before speaking which was helpful for her and in talking through situations. Showing good progress even in the midst of high stress. Encouraged patient in her coping skills discussed today and to keep believing more in  herself, realizing and reflecting on the progress she has made.  Self-confidence and stress management is improving but being tested with all the current issues going on for patient.   Interventions: {PSY:[phone]}  Treatment Goals: Some goals may remain on tx plan as patient works on them, however progress will be noted each session in "Progress" sections. Long term goal: Stabilize anxiety level while increasing ability to function on a daily basis, as Evidenced by patient being able to function daily without feeling her anxiety level impedes her functioning level.Sheran Fava term goal: Increase understanding of beliefs and messages that produce worry and amxiety, as Evidenced by patient clearly stating her improved understanding of negative/anxious thought patterns and how they affect how they lead to worry and anxiety. Strategies: 1)Help patient develop reality-based, positive cognitive messages. 2)Increase daily use of positive self-talk.  Diagnosis:No diagnosis found.  Plan: ***     Patient in today showing active participation and good motivation working further on her anxiety that is mostly connected with her family and personal stressors, along with some conflict and differing points of view from others with which she is involved or has close relationships.      Patient showing some good growth as she handles some of these situations more effectively and not letting herself get manipulated and pulled in different directions, but rather staying focused and sticking with what she feels to be the best for her, and trying to maintain healthy interactions with all involved.  Brother's wedding is also coming up which adds to the overall stress.  Patient, although quite stressed and working through a lot of that today in session, is also showing significant personal growth and maturity.  Continues to follow through with goal  directed behaviors, paying attention to her progress and also her  challenges.  Self-esteem is improving.  Working to have healthier boundaries within family and beyond and realizing she does not have to say "yes" to everything and everybody.  Feeling good about her upcoming college graduation at the end of the semester.     ////////////////////////////////////////////////////////////////////////////////////////////////////////////////////////////  Encouraged patient in practicing positive behaviors as noted in session including: Recognizing and emphasizing her strengths, remaining in the present focusing when she can change her control, getting outside some daily and walking, staying in touch with supportive people, saying no when she needs to say no, looking for more positives versus negatives in herself each day, refrain from assuming negatives and worst-case scenarios, healthy nutrition and exercise, practice consistent positive self talk, challenging counteract her self-doubt, continue working on perfectionistic issues, and and recognize the strengths she shows working with goal-directed behaviors to move in a direction that supports her improved emotional health and overall outlook.  Goal review and progress/challenges noted with patient.  Next appointment within 2 to 3 weeks.  This record has been created using [employer].  Chart creation errors have been sought, but may not always have been located and corrected.  Such creation errors do not reflect on the standard of medical care provided.   Shanon Ace, LCSW

## 2022-05-23 NOTE — Progress Notes (Signed)
[facility] Counselor/Therapist Progress Note  Patient ID: [name], MRN: [id number],    Date: 05/23/2022  Time Spent: 55 minutes   Treatment Type: Individual Therapy  Reported Symptoms: Anxiety, depression some better, stressed  Mental Status Exam:  Appearance:   Casual and Neat     Behavior:  Appropriate, Sharing, and Motivated  Motor:  Normal  Speech/Language:   Clear and Coherent  Affect:  Anxious, some depression but improved  Mood:  anxious and some depression (improved)  Thought process:  goal directed  Thought content:    WNL  Sensory/Perceptual disturbances:    WNL  Orientation:  oriented to person, place, time/date, situation, day of week, month of year, year, and stated date of Oct. 13, 2023  Attention:  Good  Concentration:  Good and Fair  Memory:  WNL  Fund of knowledge:   Good  Insight:    Good  Judgment:   Good  Impulse Control:  Good and Fair   Risk Assessment: Danger to Self:  No Self-injurious Behavior: No Danger to Others: No Duty to Warn:no Physical Aggression / Violence:No  Access to Firearms a concern: No  Gang Involvement:No   Subjective:  Patient in today reporting anxiety, feeling stressed particularly with school, and depression although "it is some better".  Her anxiety and depression is mostly related to personal, family, and school/work related issues which she was able to process a lot of of in session today.  No tearfulness.  Reports her focus is some better.  Impulse control is improved.  Brother's recent wedding "ended up okay for the most part but was very stressful".  Processed a good bit of her stressors and seem to conclude that she actually managed them "better than usual".  States that her sleep overall is some better but she still goes through waves of good and bad sleep and plans to discuss this with her med provider.  Has found that "tart cherry juice helps her sleep.  Is in the midst of a very busy time in her senior  year in college which adds to her stress but admits and gave several examples of how she is managing some things better but still a work in progress.  Her self-esteem is noticeably better.  Is not as quick to assume the negative.  Finds that she tries to "look more on the bright side than I used to".  She is really working hard in college and is majoring in Technical bre[employer] and is already following up on leads and potential opportunities beyond graduation a little later this year.  Recognizes how she is coping better and multiple ways and seemed to really appreciate some positive confirmation of that in session today.  Still struggles with her anxiety but notices how she is trying to manage it better and even when things do not go well she is working to not let it really trip her up and tries to keep moving forward.  Notices that in busy noisy areas she does not work as well and that she is much better focused when it is a quieter environment for her to work.  Pleased with some of her progress and states her family is noticing it to.  Encouraged her to keep believing in herself and reflect more on the positives versus the negatives as she continues working with her improved coping skills.  Stress management showing some progress as well as her self-confidence and overall outlook.  Interventions: Cognitive Behavioral Therapy and Ego-Supportive  Treatment Goals: Some goals may remain on tx plan as patient works on them, however progress will be noted each session in "Progress" sections. Long term goal: Stabilize anxiety level while increasing ability to function on a daily basis, as Evidenced by patient being able to function daily without feeling her anxiety level impedes her functioning level.[name] term goal: Increase understanding of beliefs and messages that produce worry and amxiety, as Evidenced by patient clearly stating her improved understanding of negative/anxious thought patterns and how they  affect how they lead to worry and anxiety. Strategies: 1)Help patient develop reality-based, positive cognitive messages. 2)Increase daily use of positive self-talk.  Diagnosis:   ICD-10-CM   1. Generalized anxiety disorder  F41.1      Plan:  Patient in today showing active participation and good motivation working further on her anxiety and some depression that is mostly connected with her family and personal stressors, along with some conflict and differing points of view from others with which she is involved or has close relationships.  Symptoms are noticeably improving and patient seems to be feeling better about herself and seeing some of her own progress.  Maintaining healthier boundaries, not feeling she has to say "yes" to everybody and everything. Encouraged patient in practicing positive behaviors as noted in session including: Recognizing and emphasizing her strengths, remaining in the present focusing when she can change her control, getting outside some daily and walking, staying in touch with supportive people, saying no when she needs to say no, looking for more positives versus negatives in herself each day, refrain from assuming negatives and worst-case scenarios, healthy nutrition and exercise, practice consistent positive self talk, challenging counteract her self-doubt, continue working on perfectionistic issues, and and recognize the strengths she shows working with goal-directed behaviors to move in a direction that supports her improved emotional health and overall outlook.  Goal review and progress/challenges noted with patient.  Next appointment within 2 to 3 weeks.  This record has been created using [employer].  Chart creation errors have been sought, but may not always have been located and corrected.  Such creation errors do not reflect on the standard of medical care provided.   [name], LCSW

## 2022-05-23 NOTE — Progress Notes (Deleted)
    [facility] Counselor/Therapist Progress Note  Patient ID: Vanessa Doyle, MRN: [id number],    Date: 05/23/2022  Time Spent: ***   Treatment Type: {CHL AMB THERAPY TYPES:2103500027}  Reported Symptoms: ***  Mental Status Exam:  Appearance:   {PSY:22683}     Behavior:  {PSY:21022743}  Motor:  {PSY:22302}  Speech/Language:   {PSY:22685}  Affect:  {PSY:22687}  Mood:  {PSY:31886}  Thought process:  {PSY:31888}  Thought content:    {PSY:2103500036}  Sensory/Perceptual disturbances:    {PSY:2103500037}  Orientation:  {PSY:30297}  Attention:  {PSY:22877}  Concentration:  {PSY:2103500040}  Memory:  {PSY:2103500038}  Fund of knowledge:   {PSY:2103500040}  Insight:    {PSY:2103500040}  Judgment:   {PSY:2103500040}  Impulse Control:  {PSY:2103500040}   Risk Assessment: Danger to Self:  {PSY:22692} Self-injurious Behavior: {PSY:22692} Danger to Others: {PSY:22692} Duty to Warn:{PSY:311194} Physical Aggression / Violence:{PSY:21197} Access to Firearms a concern: {PSY:21197} Gang Involvement:{PSY:21197}  Subjective: ***   Patient in today reporting anxiety    Patient in today reporting anxiety (stronger symptom), some depression, and stressed. Depression she reports is linked to her getting overwhelmed at times with demands on her time with school, personal, and family past relationship issues. Brother getting married this weekend and patient feeling stress over multiple issues related to that. Needed session today to focus on and work through some personal and family issues of stress and conflict creating strained relationships. (Not all details included in this note due to patient privacy needs.")  Patient intentional in her efforts to manage stress and think before speaking which was helpful for her and in talking through situations. Showing good progress even in the midst of high stress. Encouraged patient in her coping skills discussed today and to keep believing more in  herself, realizing and reflecting on the progress she has made.  Self-confidence and stress management is improving but being tested with all the current issues going on for patient.   Interventions: {PSY:2103500035}  Treatment Goals: Some goals may remain on tx plan as patient works on them, however progress will be noted each session in "Progress" sections. Long term goal: Stabilize anxiety level while increasing ability to function on a daily basis, as Evidenced by patient being able to function daily without feeling her anxiety level impedes her functioning level..  Short term goal: Increase understanding of beliefs and messages that produce worry and amxiety, as Evidenced by patient clearly stating her improved understanding of negative/anxious thought patterns and how they affect how they lead to worry and anxiety. Strategies: 1)Help patient develop reality-based, positive cognitive messages. 2)Increase daily use of positive self-talk.  Diagnosis:No diagnosis found.  Plan: ***     Patient in today showing active participation and good motivation working further on her anxiety that is mostly connected with her family and personal stressors, along with some conflict and differing points of view from others with which she is involved or has close relationships.      Patient showing some good growth as she handles some of these situations more effectively and not letting herself get manipulated and pulled in different directions, but rather staying focused and sticking with what she feels to be the best for her, and trying to maintain healthy interactions with all involved.  Brother's wedding is also coming up which adds to the overall stress.  Patient, although quite stressed and working through a lot of that today in session, is also showing significant personal growth and maturity.  Continues to follow through with goal   directed behaviors, paying attention to her progress and also her  challenges.  Self-esteem is improving.  Working to have healthier boundaries within family and beyond and realizing she does not have to say "yes" to everything and everybody.  Feeling good about her upcoming college graduation at the end of the semester.     ////////////////////////////////////////////////////////////////////////////////////////////////////////////////////////////  Encouraged patient in practicing positive behaviors as noted in session including: Recognizing and emphasizing her strengths, remaining in the present focusing when she can change her control, getting outside some daily and walking, staying in touch with supportive people, saying no when she needs to say no, looking for more positives versus negatives in herself each day, refrain from assuming negatives and worst-case scenarios, healthy nutrition and exercise, practice consistent positive self talk, challenging counteract her self-doubt, continue working on perfectionistic issues, and and recognize the strengths she shows working with goal-directed behaviors to move in a direction that supports her improved emotional health and overall outlook.  Goal review and progress/challenges noted with patient.  Next appointment within 2 to 3 weeks.  This record has been created using [employer].  Chart creation errors have been sought, but may not always have been located and corrected.  Such creation errors do not reflect on the standard of medical care provided.   Shanon Ace, LCSW

## 2022-06-06 ENCOUNTER — Ambulatory Visit: Payer: 59 | Admitting: Psychiatry

## 2022-06-06 DIAGNOSIS — F411 Generalized anxiety disorder: Secondary | ICD-10-CM

## 2022-06-06 NOTE — Progress Notes (Signed)
[facility] Counselor/Therapist Progress Note  Patient ID: Vanessa Doyle, MRN: [id number],    Date: 06/06/2022  Time Spent: 55 minutes   Treatment Type: Individual Therapy  Reported Symptoms: anxiety, stressed VV:OHYWVP, "life" and time management  Mental Status Exam:  Appearance:   Casual and Neat     Behavior:  Appropriate, Sharing, and Motivated  Motor:  Normal  Speech/Language:   Clear and Coherent  Affect:  anxious  Mood:  anxious  Thought process:  goal directed  Thought content:    Some obsessive thoughts re: school and commitments  Sensory/Perceptual disturbances:    WNL  Orientation:  oriented to person, place, time/date, situation, day of week, month of year, year, and stated date of Oct. 27, 2023  Attention:  Good  Concentration:  Fair  Memory:  WNL  Fund of knowledge:   Good  Insight:    Good and Fair  Judgment:   Good  Impulse Control:  Good   Risk Assessment: Danger to Self:  No Self-injurious Behavior: No Danger to Others: No Duty to Warn:no Physical Aggression / Violence:No  Access to Firearms a concern: No  Gang Involvement:No   Subjective:  Patient in today reporting anxiety, and feeling stressed with school and  "a lot of time management issues". Worked with patient and her time management concerns as "it affects all areas of my life".  Is to graduate at end of this current semester. Discussed problem areas and focused on setting better limits, not over-committing and being able to say "No" when needed, using her phone and notes to self which she feels will be helpful, prioritizing her time more effectively, needing to improve her self-talk to be less critical, and not feeling "guilty when I have to say "no".  Impulse control and her focus remain better."Notice that noises bother me", especially loud noises especially on TV but turning volume way down or muting it. Harder to focus when in noisy areas or people talking in a room.Waves of good and bad  sleep but better more recently. Finds her sleep does tend to be more "regular" when using melatonin gummies or tart cherry juice occasionally. Self esteem improving. Relationship with parents continues growing in positive direction. Becoming more patient with self. Reports her tendency to look more for what's going well versus not going well, which is significant for her.  Tries to look on brighter side of situations and maintain the improvement in self-confidence and belief in herself.   Interventions: Cognitive Behavioral Therapy and Ego-Supportive  Treatment Goals: Some goals may remain on tx plan as patient works on them, however progress will be noted each session in "Progress" sections. Long term goal: Stabilize anxiety level while increasing ability to function on a daily basis, as Evidenced by patient being able to function daily without feeling her anxiety level impedes her functioning level.Vanessa Doyle term goal: Increase understanding of beliefs and messages that produce worry and amxiety, as Evidenced by patient clearly stating her improved understanding of negative/anxious thought patterns and how they affect how they lead to worry and anxiety. Strategies: 1)Help patient develop reality-based, positive cognitive messages. 2)Increase daily use of positive self-talk.  Diagnosis:   ICD-10-CM   1. Generalized anxiety disorder  F41.1      Plan:  Patient today actively participating and showing good motivation in session focusing on her anxiety, feeling more stressed with school and significant time management issues.  Patient did quite well in addressing her time management concerns  giving very specific examples so that we could work with the one she struggles with the most, while in session today.  As noted above a lot of the focus was on having better limits and not over committing, being able to say no, and prioritizing her time more effectively so is also have some "personal time" where  she is not committed.  She is excited about her upcoming graduation as she should graduate at the end of the semester.  Also very nervous and talked about those feelings today as well.  Sleep is some better and she continues to work with that noticing what helps somewhat does not help.  Definitely showing more strength and trying to be more self excepting and positive with her self, given herself a break every now and then and knows that she is him and and will make mistakes at times but to learn from those and keep moving forward, versus being punitive.  Talking back to her self-doubt and "combating the negative thoughts".  Symptoms decreasing overall.  Healthier boundaries.  More belief in herself.  Really good session for patient and she is showing some good strength and growth through the use of goal-directed behaviors. Encouraged patient in her practice of positive behaviors as noted in sessions including: Recognize and emphasize her strengths, stay in the present focusing on what she can control, getting outside some each day and walking, staying in touch with supportive people, saying no when she needs to say no, looking for more positives versus negatives in herself each day, refrain from assuming negatives and worst-case scenarios, healthy nutrition and exercise, rectus consistent positive self talk, challenge and counteract her self-doubt, continue working on perfectionistic issues, and realize the strengths she shows working with goal-directed behaviors to move in the direction that supports her improved emotional health  Goal review and progress/challenges noted with patient.  Next appointment within 2 to 3 weeks.  This record has been created using [employer].  Chart creation errors have been sought, but may not always have been located and corrected.  Such creation errors do not reflect on the standard of medical care provided.   Shanon Ace, LCSW

## 2022-06-20 ENCOUNTER — Ambulatory Visit: Payer: 59 | Admitting: Psychiatry

## 2022-06-20 DIAGNOSIS — F411 Generalized anxiety disorder: Secondary | ICD-10-CM | POA: Diagnosis not present

## 2022-06-20 NOTE — Progress Notes (Signed)
[facility] Counselor/Therapist Progress Note  Patient ID: Vanessa Doyle, MRN: [id number],    Date: 06/20/2022  Time Spent: 55 minutes   Treatment Type: Individual Therapy  Reported Symptoms: anxiety, overthinking/overanalyzing/obsessive  Mental Status Exam:  Appearance:   Casual     Behavior:  Appropriate, Sharing, and Motivated  Motor:  Normal  Speech/Language:   Clear and Coherent  Affect:  anxiety  Mood:  anxious  Thought process:  goal directed  Thought content:    Some obsessive thoughts  Sensory/Perceptual disturbances:    WNL  Orientation:  oriented to person, place, time/date, situation, day of week, month of year, year, and stated date of Nov. 10, 2023  Attention:  Good  Concentration:  Good and Fair  Memory:  WNL  Fund of knowledge:   Good  Insight:    Good  Judgment:   Good  Impulse Control:  Good   Risk Assessment: Danger to Self:  No Self-injurious Behavior: No Danger to Others: No Duty to Warn:no Physical Aggression / Violence:No  Access to Firearms a concern: No  Gang Involvement:No   Subjective: Patient in today reporting anxiety mostly related school and "the unknowns of after graduation this year."  Stressed for "when I move out of my apt" as there's been some scheduling glitches. Am stressed about completing everything I have to do for classes before graduation on Dec. 15, 2023. Outlined today the tasks that feel most stressful for her which we were able to process together and look at some prioritization of tasks that seemed helpful to patient. Has continued work on her time-management issues (that affects me in all areas of my life) as discussed some last session and followed up on this more today, recognizing some progress. Continues gradual improvement in "saying No when I need to say No, setting healthier limits with others and my schedule, improving her self-talk, prioritizing her time more effectively, and refraining from feeling guilty when  saying No". Self-esteem improving gradually and showing increased patience with herself. Working to be more habitual in "looking more for the positives versus negatives especially about herself.   Interventions: Cognitive Behavioral Therapy and Ego-Supportive  Long term goal: Stabilize anxiety level while increasing ability to function on a daily basis, as Evidenced by patient being able to function daily without feeling her anxiety level impedes her functioning level.Vanessa Doyle term goal: Increase understanding of beliefs and messages that produce worry and amxiety, as Evidenced by patient clearly stating her improved understanding of negative/anxious thought patterns and how they affect how they lead to worry and anxiety. Strategies: 1)Help patient develop reality-based, positive cognitive messages. 2)Increase daily use of positive self-talk.  Diagnosis:   ICD-10-CM   1. Generalized anxiety disorder  F41.1      Plan:  Patient today participating actively in session and showing good motivation as she focused on her stress management, anxiety, time management, setting and keeping priorities, more positive self-talk, healthy boundaries, and believing more in herself as she faces pulling together final assignments and projects due in the near future prior to graduation from college on Dec. 15, 2023. Has continued to "talk back to her negative thoughts and self-doubt" with benefit. Showing determination even with her anxiety on board although improving. To continue working towards goal achievement using goal-directed behaviors between sessions, and will review again next session in 2-3 weeks. Encouraged patient in her practice of positive behaviors as noted in session including: Recognize and emphasize her strengths, stay in the  present focusing on what she can control, getting outside some each day and walking, staying in touch with supportive people, saying no when she needs to say no, looking for  more positives versus negatives in herself and others each day, refrain from assuming negatives and worst-case scenarios, healthy nutrition and exercise, consistent positive self talk, challenging counteract her self-doubt, continue working on perfectionistic issues, and recognize the strength she shows working with goal-directed behaviors to move in a direction that supports her improved emotional health.  Goal review and progress/challenges noted with patient.  Next appointment within 2 to 3 weeks.  This record has been created using [employer].  Chart creation errors have been sought, but may not always have been located and corrected.  Such creation errors do not reflect on the standard of medical care provided.   [name], LCSW

## 2022-07-11 ENCOUNTER — Ambulatory Visit (INDEPENDENT_AMBULATORY_CARE_PROVIDER_SITE_OTHER): Payer: 59 | Admitting: Psychiatry

## 2022-07-11 DIAGNOSIS — F411 Generalized anxiety disorder: Secondary | ICD-10-CM

## 2022-07-11 NOTE — Progress Notes (Signed)
[facility] Counselor/Therapist Progress Note  Patient ID: Vanessa Doyle, MRN: [id number],    Date: 07/11/2022  Time Spent: 55 minutes   Treatment Type: Individual Therapy  Reported Symptoms: anxiety, exhaustion due to so many things coming up between now and Dec. 15th graduate school graduation.  Mental Status Exam:  Appearance:   Casual and Neat     Behavior:  Appropriate, Sharing, and Motivated  Motor:  Normal  Speech/Language:   Clear and Coherent  Affect:  anxious  Mood:  anxious  Thought process:  goal directed  Thought content:    Some obsessive thoughts  Sensory/Perceptual disturbances:    WNL  Orientation:  oriented to person, place, time/date, situation, day of week, month of year, year, and stated date of Dec. 1, 2023  Attention:  Good  Concentration:  Good  Memory:  WNL  Fund of knowledge:   Good  Insight:    Good  Judgment:   Good  Impulse Control:  Good   Risk Assessment: Danger to Self:  No Self-injurious Behavior: No Danger to Others: No Duty to Warn:no Physical Aggression / Violence:No  Access to Firearms a concern: No  Gang Involvement:No   Subjective: Patient today reporting anxiety as her primary symptom and wanting to work on today. To graduate in 2 wks from grad school and starting to look for and over opportunities that she may want to pursue. Thanksgiving went ok for most part except anxiety due to some illness of people including patient, and extended family. Some difficult personal relationship issues within family which she processed in detail and looking at better ways of managing stressful interactions with family and beyond.Showing increased self confidence.Working more on self-acceptance, in preparation to move forward.  Working further on healthier boundaries with others and shared several examples. Recognizing some of her growth and feeling encouraged about it, and wanting build upon current success/progress.  Interventions: Cognitive  Behavioral Therapy, Solution-Oriented/Positive Psychology, and Ego-Supportive  Long term goal: Stabilize anxiety level while increasing ability to function on a daily basis, as Evidenced by patient being able to function daily without feeling her anxiety level impedes her functioning level.[name] term goal: Increase understanding of beliefs and messages that produce worry and amxiety, as Evidenced by patient clearly stating her improved understanding of negative/anxious thought patterns and how they affect how they lead to worry and anxiety. Strategies: 1)Help patient develop reality-based, positive cognitive messages. 2)Increase daily use of positive self-talk.  Diagnosis:   ICD-10-CM   1. Generalized anxiety disorder  F41.1      Plan: Patient today showing good participation and motivation continuing her work on goal-directed behaviors more specifically today on anxiety and multiple stressful interactions with family.  Is showing progress and not being quite as overwhelmed when things do not go as planned or expected.  Also showing some progress and not taking things quite as personally and being hurt when other people do not always make good choices or speak and hurtful ways.  Working through some acceptance issues with new remember the family.  Focused also today on some self calming skills especially for the next couple of weeks coming up with her graduating from graduate school and soon after that the Christmas holiday.  Is showing increased motivation and working on her goals, some of which seems to be fueled by her recognizing more progress, and next steps involve continued work on treatment goals to build upon that progress she is already noticing.  Decreased self-doubt  and trying to improve her setting and keeping priorities, in addition to believing more in herself. Encouraged patient and practicing more positive behaviors as noted in session including: Emphasizing her strengths, stay in  the present focusing on what she can change her control, getting outside some each day, stay in touch with supportive people, saying no when she needs to say no, looking for more positives versus negatives in herself and others daily, refrain from assuming negatives of worst-case scenarios, healthy nutrition and exercise, consistent positive self talk, challenge and counteract her self-doubt, continue working on perfectionistic issues, and realize the strength she shows working with goal-directed behaviors to move in a direction that supports her improved emotional health and improve self-confidence.  Goal review and progress/challenges noted with patient.  Next appointment within 3 weeks.  This record has been created using [employer].  Chart creation errors have been sought, but may not always have been located and corrected.  Such creation errors do not reflect on the standard of medical care provided.   [name], LCSW

## 2022-08-21 ENCOUNTER — Ambulatory Visit: Payer: 59 | Admitting: Psychiatry

## 2022-08-21 DIAGNOSIS — F411 Generalized anxiety disorder: Secondary | ICD-10-CM

## 2022-08-21 NOTE — Progress Notes (Signed)
[facility] Counselor/Therapist Progress Note  Patient ID: Vanessa Doyle, MRN: [id number],    Date: 08/21/2022  Time Spent: 55 minutes   Treatment Type: Individual Therapy  Reported Symptoms: anxiety, some depression, difficulty motivating  Mental Status Exam:  Appearance:   Casual and Neat     Behavior:  Appropriate, Sharing, and some challenges with motivation  Motor:  Normal  Speech/Language:   Clear and Coherent  Affect:  Anxious, some depression  Mood:  anxious and some depression  Thought process:  goal directed  Thought content:    Obsessive thoughts especially re: graduating college and not having job yet  Sensory/Perceptual disturbances:    WNL  Orientation:  oriented to person, place, time/date, situation, day of week, month of year, year, and stated date of Jan. 11, 2024  Attention:  Good  Concentration:  Good  Memory:  WNL  Fund of knowledge:   Good  Insight:    Good  Judgment:   Good  Impulse Control:  Good   Risk Assessment: Danger to Self:  No Self-injurious Behavior: No Danger to Others: No Duty to Warn:no Physical Aggression / Violence:No  Access to Firearms a concern: No  Gang Involvement:No   Subjective: Patient in today reporting anxiety and some depression, and some difficulty feeling motivated. Worked first with her motivation and trying to strengthen it. States things that typically help her motivation are : routine, consistency, less chaos, a sense of commitment. Processed each of these and how she can use them to strengthen her motivation and her desire to make changes, working with goal-directed behaviors. Also processed some recent hurt with a parent that contributed to her sadness, stress, and feeling misunderstood. Difficult inter-family interactions, and trying to trust herself more. Holidays were difficult with some family members and gave patient more insight into extended family and issues she needs to work on. Is trying to manage  family-related stressor more effectively but often finds it difficult. Needing a more regular sleep pattern, managing stress more effectively as discussed some today, and commits to working on these between now and next session.   Interventions: Cognitive Behavioral Therapy and Ego-Supportive  Long term goal: Stabilize anxiety level while increasing ability to function on a daily basis, as Evidenced by patient being able to function daily without feeling her anxiety level impedes her functioning level.Sheran Fava term goal: Increase understanding of beliefs and messages that produce worry and amxiety, as Evidenced by patient clearly stating her improved understanding of negative/anxious thought patterns and how they affect how they lead to worry and anxiety. Strategies: 1)Help patient develop reality-based, positive cognitive messages. 2)Increase daily use of positive self-talk  Diagnosis:   ICD-10-CM   1. Generalized anxiety disorder  F41.1      Plan: Patient today working to strengthen her motivation especially in working more on goal-directed behaviors more specifically related to personal, family situations and also getting a job after having graduated from college.  Experiencing some hurtful situations within immediate family more recently and needed part of session to really work on those experiences and how to better manage and cope, including being able to express herself more effectively and in ways that she can be better heard and understood which might could help the conflict and misunderstanding as well.  Trying to manage stressful interactions more effectively especially within the family.  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  Showing progress and needing to continue her work with goal-directed behaviors to  help her feel more stability and negotiate personal and family challenges.  Continued work needed in her self-doubt where she had made some previous progress.  Encouraged patient in practicing more positive behaviors as noted in session including: Emphasizing her strengths, stay in the present focusing on what she can change or control, getting outside some each day, stay in touch with supportive people , saying no when she needs to say no, looking for more positives versus negatives in herself and others daily, refrain from assuming negatives or worst-case scenarios, healthy nutrition and exercise, consistent positive self-talk, challenge and counteract her self-doubt, continue working on perfectionistic issues, and realize the strength she shows working with goal-directed behaviors to move in a direction that supports her improved emotional health and overall well being.  Goal review and progress/challenges noted with patient.  Next appt within 1 month.  This record has been created using [employer].  Chart creation errors have been sought, but may not always have been located and corrected.  Such creation errors do not reflect on the standard of medical care provided.   Shanon Ace, LCSW

## 2022-09-04 ENCOUNTER — Ambulatory Visit: Payer: 59 | Admitting: Psychiatry

## 2022-09-04 DIAGNOSIS — F411 Generalized anxiety disorder: Secondary | ICD-10-CM | POA: Diagnosis not present

## 2022-09-04 NOTE — Progress Notes (Signed)
[facility] Counselor/Therapist Progress Note  Patient ID: CHINA DEITRICK, MRN: [id number],    Date: 09/04/2022  Time Spent: 55 minutes   Treatment Type: Individual Therapy  Reported Symptoms: anxiety, depression, lower motivation  Mental Status Exam:  Appearance:   Casual and Neat     Behavior:  Appropriate and Sharing  Motor:  Normal  Speech/Language:   Clear and Coherent  Affect:  Depressed; anxious  Mood:  anxious and depressed  Thought process:  goal directed  Thought content:    Obsessive thoughts  Sensory/Perceptual disturbances:    WNL  Orientation:  oriented to person, place, time/date, situation, day of week, month of year, year, and stated date of Jan. 25, 2024  Attention:  Good  Concentration:  Good and Fair  Memory:  WNL  Fund of knowledge:   Good  Insight:    Good and Fair  Judgment:   Good and Fair  Impulse Control:  Good and Fair   Risk Assessment: Danger to Self:  No Self-injurious Behavior: No Danger to Others: No Duty to Warn:no Physical Aggression / Violence:No  Access to Firearms a concern: No  Gang Involvement:No   Subjective: Patient in session today reporting anxiety, depression, and lower motivation mostly related to recent college graduation, personal and family situation. Hard to get motivated on job search. 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 Working today on her motivation issues, including what seems to be getting in the way of her feeling more motivated. Did well in looking at what is getting in her way and she identified several stressors that are getting in her way and leads her to feel overwhelmed. Discussed other ways of managing stress and challenges,rather than isolating and avoiding making any strides towards her goals. The more she talked, the calmer she looked. Talked further about what tends to get in her way when she tries to focus on applying for jobs, and she identified a couple obstacles that mostly  related to patient herself and trying to feel more confident, but also wanting to talk about how to set boundaries with others when "they ask a lot of questions" and patient begins to feel self-conscious and that she has to answer everyone's question. Encouraged patient to be ok with setting healthy boundaries with others.   Interventions: Cognitive Behavioral Therapy and Ego-Supportive  Long term goal: Stabilize anxiety level while increasing ability to function on a daily basis, as Evidenced by patient being able to function daily without feeling her anxiety level impedes her functioning level.Sheran Fava term goal: Increase understanding of beliefs and messages that produce worry and amxiety, as Evidenced by patient clearly stating her improved understanding of negative/anxious thought patterns and how they affect how they lead to worry and anxiety. Strategies: 1)Help patient develop reality-based, positive cognitive messages. 2)Increase daily use of positive self-talk  Diagnosis:   ICD-10-CM   1. Generalized anxiety disorder  F41.1      Plan:  Patient in today and focusing on trying to improve her motivation, while working on anxiety, depression, and some personal/family situations involved.  Acknowledges that things that help her and trying to motivate herself are being consistent, having less chaos, trying to keep some sort of routine, and having a sense of commitment and we were able to focus on each of these during part of the session today.  Did work hard in session today and seem to feel better towards the end of session.  Definitely felt  better about herself on the way she confronted issues and are talking, issues that are very hard for her.  Burst into tears at one point and that herself cried out, later admitting that actually felt good.  Was calm the remaining part of the session and able to come up with some short-term goals that felt reasonable for her especially around getting a few  task done at home and beginning more of a process of job searching and what all that involves for her.  Trying to manage family stressors more effectively and that is an area that she tends to be more sensitive about and has noted that there are several different personalities involved.  Working on improving her self-acceptance and maintaining boundaries.  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  Very gradually, seems to be feeling better about herself and having some increase strength, less self-doubt Encouraged patient in her practice of more positive behaviors as noted in session including: Emphasizing her strengths, staying in the present focusing on what she can change her control, getting outside some each day, staying in touch with supportive people, saying no when she needs to say no, looking for more positives versus negatives in herself and others daily, refrain from assuming negatives or worst-case scenarios, healthy nutrition and exercise, consistent positive self talk, challenging counteract her self-doubt, continue working on perfectionistic issues, and recognize the strength she shows working with goal-directed behaviors to move in a direction that supports her improved emotional health and overall wellbeing.  Goal review and progress/challenges noted with patient.  Next appt within 2 weeks.  This record has been created using [employer].  Chart creation errors have been sought, but may not always have been located and corrected.  Such creation errors do not reflect on the standard of medical care provided.   Shanon Ace, LCSW

## 2022-09-04 NOTE — Progress Notes (Incomplete)
[facility] Counselor/Therapist Progress Note  Patient ID: Vanessa Doyle, MRN: [id number],    Date: 09/04/2022  Time Spent: ***   Treatment Type: {[facility] AMB THERAPY TYPES:[id number]}  Reported Symptoms: ***  Mental Status Exam:  Appearance:   {PSY:22683}     Behavior:  {PSY:21022743}  Motor:  {PSY:22302}  Speech/Language:   {PSY:22685}  Affect:  {PSY:22687}  Mood:  {PSY:31886}  Thought process:  {PSY:31888}  Thought content:    {PSY:[id number]}  Sensory/Perceptual disturbances:    {PSY:661-824-2719}  Orientation:  {PSY:30297}  Attention:  {PSY:22877}  Concentration:  {PSY:226-014-2199}  Memory:  {[phone]}  Fund of knowledge:   {PSY:226-014-2199}  Insight:    {PSY:226-014-2199}  Judgment:   {PSY:226-014-2199}  Impulse Control:  {PSY:226-014-2199}   Risk Assessment: Danger to Self:  {PSY:22692} Self-injurious Behavior: {PSY:22692} Danger to Others: {PSY:22692} Duty to Warn:{PSY:311194} Physical Aggression / Violence:{PSY:21197} Access to Firearms a concern: {PSY:21197} Gang Involvement:{PSY:21197}  Subjective: ***    Patient in today reporting anxiety and some depression, and some difficulty feeling motivated. Worked first with her motivation and trying to strengthen it. States things that typically help her motivation are : routine, consistency, less chaos, a sense of commitment. Processed each of these and how she can use them to strengthen her motivation and her desire to make changes, working with goal-directed behaviors. Also processed some recent hurt with a parent that contributed to her sadness, stress, and feeling misunderstood. Difficult inter-family interactions, and trying to trust herself more. Holidays were difficult with some family members and gave patient more insight into extended family and issues she needs to work on. Is trying to manage family-related stressor more effectively but often finds it difficult. Needing a more regular sleep pattern, managing  stress more effectively as discussed some today, and commits to working on these between now and next session.      Interventions: {PSY:[id number]}  Long term goal: Stabilize anxiety level while increasing ability to function on a daily basis, as Evidenced by patient being able to function daily without feeling her anxiety level impedes her functioning level.Sheran Fava term goal: Increase understanding of beliefs and messages that produce worry and amxiety, as Evidenced by patient clearly stating her improved understanding of negative/anxious thought patterns and how they affect how they lead to worry and anxiety. Strategies: 1)Help patient develop reality-based, positive cognitive messages. 2)Increase daily use of positive self-talk  Diagnosis:No diagnosis found.  Plan: ***    Patient today working to strengthen her motivation especially in working more on goal-directed behaviors more specifically related to personal, family situations and also getting a job after having graduated from college.  Experiencing some hurtful situations within immediate family more recently and needed part of session to really work on those experiences and how to better manage and cope, including being able to express herself more effectively and in ways that she can be better heard and understood which might could help the conflict and misunderstanding as well.  Trying to manage stressful interactions more effectively especially within the family.  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  Showing progress and needing to continue her work with goal-directed behaviors to help her feel more stability and negotiate personal and family challenges.  Continued work needed in her self-doubt where she had made some previous progress.   ///////////////////////////////////////////////////////////////////////////////////////////////  Encouraged patient in her practice of more positive behaviors as  discussed in session including: Recognizing and emphasizing her strengths, staying in the present focusing on what she  can control or change, getting outside some each day, staying in touch with supportive people, saying no when she needs to say no, looking for more positives versus negatives in herself and others daily, refrain from assuming negatives and worst-case scenarios, healthy nutrition and exercise, consistent positive self talk, challenging counteract her self-doubt, continue working on perfectionistic issues, and recognize the strength she shows working with goal-directed behaviors to move in a direction that supports her improved emotional health and wellbeing.  Goal review and progress/challenges noted with patient.  Next appointment within 2 to 3 weeks.  This record has been created using [employer].  Chart creation errors have been sought, but may not always have been located and corrected.  Such creation errors do not reflect on the standard of medical care provided.   Shanon Ace, LCSW

## 2022-09-04 NOTE — Addendum Note (Signed)
Addended byShanon Ace on: 09/04/2022 05:24 PM   Modules accepted: Level of Service

## 2022-09-16 ENCOUNTER — Ambulatory Visit: Payer: 59 | Admitting: Psychiatry

## 2022-09-16 DIAGNOSIS — F411 Generalized anxiety disorder: Secondary | ICD-10-CM

## 2022-09-16 NOTE — Progress Notes (Signed)
[facility] Counselor/Therapist Progress Note  Patient ID: Vanessa Doyle, MRN: [id number],    Date: 09/16/2022  Time Spent: 55 minutes   Treatment Type: Individual Therapy  Reported Symptoms: anxiety, depression (some better), overwhelmed (job search, family)  Mental Status Exam:  Appearance:   Casual     Behavior:  Appropriate, Sharing, and Motivated  Motor:  Normal  Speech/Language:   Clear and Coherent  Affect:  Anxious, some depression  Mood:  anxious and some depression  Thought process:  goal directed  Thought content:    Rumination and obsessive thoughts  Sensory/Perceptual disturbances:    WNL  Orientation:  oriented to person, place, time/date, situation, day of week, month of year, year, and stated date of Feb. 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 Making some progress in job search (this will be first job since graduation in December of past yr. Getting more motivated. Some nail-biting when more stressed and we discussed strategies to stop this and patient receptive. Some adjustment issues moving from college to her parent's home and notes she is having some adjustments in all of this. Issues within relationship with her mom as "we clash sometimes and it's easy for [place] to upset each other now that we're back living in the same home."  Some tearfulness as she spoke more openly about some very sensitive topics in session.  Very sensitive about herself and how others see her,  which we discussed in more detail in session today.  Is working on her relationship with her mother.  (Not all details included in this note due to patient privacy needs).  Wanting to work further on feeling more confident, setting boundaries, and not feeling so self-conscious.  Interventions: Cognitive Behavioral Therapy and Ego-Supportive  Long term goal: Stabilize anxiety level while increasing ability to function on a daily basis, as Evidenced by patient being able to function daily without feeling her anxiety level impedes her functioning level.Sheran Fava term goal: Increase understanding of beliefs and messages that produce worry and amxiety, as Evidenced by patient clearly stating her improved understanding of negative/anxious thought patterns and how they affect how they lead to worry and anxiety. Strategies: 1)Help patient develop reality-based, positive cognitive messages. 2)Increase daily use of positive self-talk  Diagnosis:   ICD-10-CM   1. Generalized anxiety disorder  F41.1      Plan:  Patient showing motivation and active participation in session today as she worked more on her anxiety and feeling overwhelmed after having graduated from college and has not yet gotten a job.  A lot of her concerns revolve about her sense of self and also some issues in relationship to mother.  Showing better motivation and self disclosure even though she often feels like some disclosure makes her look bad or inadequate.  Assured patient that she is not an adequate nor if she bad and that all the things she is discussing in session, can be worked  on and improved.  Do the things that tend to help her feel more confident and balanced which includes having a set routine and predictability.  Is trying to handle family stressors more effectively and not be overwhelmed and not assuming that something is wrong with her personally.  Continues working on improving her self-acceptance, maintaining healthy  boundaries, and bleeding more in herself.  Has made some progress and needs to continue with her goal-directed behaviors that help her feel more stable as she continues to work through therapeutic issues and challenges. Encouraged patient and practicing more positive behaviors as discussed in session including: Emphasizing her strengths, remaining in the present focusing on what she can change or control, getting outside some each day and walking, staying in touch with supportive people, saying no when she needs to say no, looking for more positives versus negatives in herself and others, refrain from assuming negatives and worst-case scenarios, healthy nutrition and exercise, consistent positive self talk, challenge and counteract her self-doubt, continue working on perfectionistic issues, and realize the strengths she shows working with goal-directed behaviors to move in a direction that supports her improved emotional health and outlook.  Goal review and progress/challenges noted with patient.  Next appointment within 2 weeks.  This record has been created using [employer].  Chart creation errors have been sought, but may not always have been located and corrected.  Such creation errors do not reflect on the standard of medical care provided.   Shanon Ace, LCSW

## 2022-09-24 ENCOUNTER — Ambulatory Visit: Payer: 59 | Admitting: Psychiatry

## 2022-09-24 DIAGNOSIS — F411 Generalized anxiety disorder: Secondary | ICD-10-CM

## 2022-09-24 NOTE — Progress Notes (Signed)
[facility] Counselor/Therapist Progress Note  Patient ID: Vanessa Doyle, MRN: [id number],    Date: 09/24/2022  Time Spent: 50 minutes   Treatment Type: Individual Therapy  Reported Symptoms: anxiety, depression (improving)  Mental Status Exam:  Appearance:   Casual and Neat     Behavior:  Appropriate, Sharing, and Motivated  Motor:  Normal  Speech/Language:   Clear and Coherent  Affect:  Depressed and anxious  Mood:  anxious and depressed  Thought process:  goal directed  Thought content:    Obsessive thoughts  Sensory/Perceptual disturbances:    WNL  Orientation:  oriented to person, place, time/date, situation, day of week, month of year, year, and stated date of Feb. 14, 2024  Attention:  Good  Concentration:  Good  Memory:  WNL  Fund of knowledge:   Good  Insight:    Good  Judgment:   Good  Impulse Control:  Good and Fair   Risk Assessment: Danger to Self:  No Self-injurious Behavior: No Danger to Others: No Duty to Warn:no Physical Aggression / Violence:No  Access to Firearms a concern: No  Gang Involvement:No   Subjective:   Patient in today reporting some improved motivation and actually following through better on goal-related behaviors. Anxious about getting a job and showing more follow-through in submitting job applications including 5 this past week. Already heard back from one that position and it is filled. Waiting to hear from others, and still following through in submitting other applications. Smiling some more today and showing more strength, and admits it is challenging right now "to manage everything in her life." Trying to contact friends more and did plan 2 "get-togethers". Working to improve self-talk. Not as overwhelmed.  No tearfulness today.  Still quite sensitive as to how she perceives herself and how she feels others perceive her which tends to lean more in a negative direction. Focusing more on improving relationship with mother.  Nail-biting decreased some.  Sensing some improvement herself especially in her own sense of self worth and not focusing on perfection.  Continue to work on relationship with mother.  (Not all details included in this note due to patient privacy needs).  Working on her self-confidence, having healthy boundaries, and being able to take chances and trying some new behaviors and ways of managing stressors.  Interventions: Cognitive Behavioral Therapy and Ego-Supportive  Long term goal: Stabilize anxiety level while increasing ability to function on a daily basis, as Evidenced by patient being able to function daily without feeling her anxiety level impedes her functioning level.Sheran Fava term goal: Increase understanding of beliefs and messages that produce worry and amxiety, as Evidenced by patient clearly stating her improved understanding of negative/anxious thought patterns and how they affect how they lead to worry and anxiety. Strategies: 1)Help patient develop reality-based, positive cognitive messages. 2)Increase daily use of positive self-talk   Diagnosis:   ICD-10-CM   1. Generalized anxiety disorder  F41.1      Plan: Patient today participating well in session and showing motivation as she worked on her anxiety and some relationship issues within family.  Is noting progress as she became motivated enough to begin submitting job applications; has submitted 5 and plans to submit more.  Ongoing concerns with relationship with mother which is a work in progress as far as their being able to communicate and interact in healthier ways with each other.  Making progress and is to continue with goal-directed behaviors in order to move  forward more positively and build upon the strength and progress she is already showing.  Stressed again with patient that she is not inadequate and she seems to be believing this more.  Does feel better when she has a more predictable and set routine.  Family stressors  are a challenge at times.  Needs to continue work on self acceptance, healthy boundaries, and believing more in herself. Encouraged patient in her practice of more positive and self affirming behavior as noted in session including: Emphasizing her strengths, staying in the present focusing on what she can control or change, getting outside some each day and walking, staying in touch with supportive people, saying no when she needs to say no, looking for more positives versus negatives in herself and others, refrain from assuming negatives and worst-case scenarios, healthy nutrition and exercise, consistent positive self talk, challenging counteract her self-doubt, continue working on perfectionistic issues, and recognize the strength she shows when working with goal-directed behaviors to move in a direction that supports her improved emotional health and overall wellbeing.  Goal review and progress/challenges noted with patient.  Next appt within 2 weeks.  This record has been created using [employer].  Chart creation errors have been sought, but may not always have been located and corrected.  Such creation errors do not reflect on the standard of medical care provided.   Shanon Ace, LCSW

## 2022-09-30 ENCOUNTER — Ambulatory Visit: Payer: 59 | Admitting: Psychiatry

## 2022-09-30 DIAGNOSIS — F411 Generalized anxiety disorder: Secondary | ICD-10-CM | POA: Diagnosis not present

## 2022-09-30 NOTE — Progress Notes (Signed)
[facility] Counselor/Therapist Progress Note  Patient ID: Vanessa Doyle, MRN: [id number],    Date: 09/30/2022  Time Spent: 55 minutes   Treatment Type: Individual Therapy  Reported Symptoms: anxiety, depression, tearfulness  Mental Status Exam:  Appearance:   Casual     Behavior:  Appropriate, Sharing, and Motivated  Motor:  Normal  Speech/Language:   Clear and Coherent  Affect:  Depressed and anxiety  Mood:  anxious and depressed  Thought process:  goal directed  Thought content:    Obsessive thoughts  Sensory/Perceptual disturbances:    WNL  Orientation:  oriented to person, place, time/date, situation, day of week, month of year, year, and stated date of Feb. 20, 2024  Attention:  Good  Concentration:  Good  Memory:  WNL  Fund of knowledge:   Good  Insight:    Good and Fair  Judgment:   Good  Impulse Control:  Good   Risk Assessment: Danger to Self:  No Self-injurious Behavior: No Danger to Others: No Duty to Warn:no Physical Aggression / Violence:No  Access to Firearms a concern: No  Gang Involvement:No   Subjective:  Patient in today reporting anxiety and depression, with "depression being my stronger symptom." Reporting significant anxiety whenever "relatives" visit the home. Provided several examples that have stressed patient more recently. (Not all details included in this note due to patient privacy needs.) Processed with patient her anxious feelings and how they are impacting her relationships within the family and extended family. Sometimes not feeling a part of the family at times which is hurtful for patient and she was able to tearfully talk through this more today. Feels part of family is more judgmental and acknowledges that she does sometime overthink and make assumptions that may not be true. Trying not to overpersonalize. Working with some communication and relationship issues within family and extended family, and considering what she may need to  change in terms of how she views certain things within family and to accepting herself more.  Some tearfulness today and was able to talk through her tears related to "frustration, sometimes anger, and some sadness".  Seem to feel better after her tearfulness near the end of session.  Ongoing efforts to have healthier relationship with her mother and trying to refrain from focusing on "being perfect".  Trying to have healthier boundaries.  Wanting to feel more confident and manage stressors more effectively as we discussed in session earlier.   Interventions: Cognitive Behavioral Therapy and Ego-Supportive  Long term goal: Stabilize anxiety level while increasing ability to function on a daily basis, as Evidenced by patient being able to function daily without feeling her anxiety level impedes her functioning level.Sheran Fava term goal: Increase understanding of beliefs and messages that produce worry and amxiety, as Evidenced by patient clearly stating her improved understanding of negative/anxious thought patterns and how they affect how they lead to worry and anxiety. Strategies: 1)Help patient develop reality-based, positive cognitive messages. 2)Increase daily use of positive self-talk  Diagnosis:   ICD-10-CM   1. Generalized anxiety disorder  F41.1      Plan:  Patient showing good motivation and participation as she worked in session today on her anxiety and depression including issues that relate to family dynamics and relationships, along with patient's tendency to over personalize and overthink issues.  Is definitely making progress and needs to continue her work with goal-directed behaviors in order to keep moving forward in a positive direction.  Significant issues  around self-acceptance and healthy boundaries. Encouraged patient in practicing more positive and self affirming behaviors as noted in session including: Staying in the present focusing on what she can change her control,  emphasizing her strengths, getting outside some each day and walking, remaining in touch with supportive people, saying no when she needs to say no, looking for more positives versus negatives in herself and others, refrain from assuming worst-case scenarios, healthy nutrition and exercise, more consistent positive self talk, challenge and counteract her self-doubt, continue working on perfectionistic issues, and realize the strength she shows when working with goal-directed behaviors to move in a direction that supports her improved emotional health and outlook.  Goal review and progress/challenges noted with patient.  Next appointment within 2 weeks.  This record has been created using [employer].  Chart creation errors have been sought, but may not always have been located and corrected.  Such creation errors do not reflect on the standard of medical care provided.   Shanon Ace, LCSW

## 2022-10-08 ENCOUNTER — Ambulatory Visit: Payer: 59 | Admitting: Psychiatry

## 2022-10-08 DIAGNOSIS — F411 Generalized anxiety disorder: Secondary | ICD-10-CM | POA: Diagnosis not present

## 2022-10-08 NOTE — Progress Notes (Signed)
[facility] Counselor/Therapist Progress Note  Patient ID: Vanessa Doyle, MRN: [id number],    Date: 10/08/2022  Time Spent: 55 minutes  Treatment Type: Individual Therapy  Reported Symptoms: anxiety, "but not all day long", depression, low energy, some motivational challenges  Mental Status Exam:  Appearance:   Casual and Neat     Behavior:  Appropriate and Sharing  Motor:  Normal  Speech/Language:   Clear and Coherent  Affect:  Depressed and anxious  Mood:  anxious and depressed  Thought process:  goal directed  Thought content:    Rumination and obsessive thoughts  Sensory/Perceptual disturbances:    WNL  Orientation:  oriented to person, place, time/date, situation, day of week, month of year, year, and stated date of Feb. 28, 2024  Attention:  Good  Concentration:  Good  Memory:  WNL  Fund of knowledge:   Good  Insight:    Good and Fair  Judgment:   Good  Impulse Control:  Good and Fair   Risk Assessment: Danger to Self:  No Self-injurious Behavior: No Danger to Others: No Duty to Warn:no Physical Aggression / Violence:No  Access to Firearms a concern: No  Gang Involvement:No   Subjective:   Patient in today reporting anxiety, depression, low energy, some irritability, some tearfulness, and some motivational challenges.  Symptoms are mostly related to personal, family, job hunting. Is doing barre exercise group and that is going well. Is doing some occasional work on specific jobs that are typical 1-3 days. 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 Struggling more with depression "but some days are better than others."  Still reports sometimes not really feeling a part of the family mostly due to conflicts within family.  Feeling judged at times and patient acknowledges her tendency to overthink and make assumptions that may or may not be true.  States that she tries not to over personalize.  Maintains healthy boundaries "as much as I  can".  Relationship with mother definitely an ongoing issue right now which patient states are related to "lack of emotional understanding between the 2 of them, communication with each other and sometimes the family, and my difficulty in handling conflict within family and especially with mother."  Patient does recognize that her perception may not always be accurate. Wants and did work today on not responding in defensive ways with mom, working with several examples.  Definitely wants to feel that she is managing family stressors and relationships in healthier ways so as to feel more confident in that area of her life.  Reports feeling understood and is to continue working on this in between sessions and to follow-up within 1 to 2 weeks.  Interventions: Cognitive Behavioral Therapy, Solution-Oriented/Positive Psychology, and Ego-Supportive  Long term goal: Stabilize anxiety level while increasing ability to function on a daily basis, as Evidenced by patient being able to function daily without feeling her anxiety level impedes her functioning level.Sheran Fava term goal: Increase understanding of beliefs and messages that produce worry and amxiety, as Evidenced by patient clearly stating her improved understanding of negative/anxious thought patterns and how they affect how they lead to worry and anxiety. Strategies: 1)Help patient develop reality-based, positive cognitive messages. 2)Increase daily use of positive self-talk  Diagnosis:   ICD-10-CM   1. Generalized anxiety disorder  F41.1      Plan: Patient actively participating in session today focusing on her anxiety, depression, low energy, irritability, motivation, and  specific family relationships where there is a struggle for her.  Patient worked really well with some role-play in session and also talking through her concerns and behaviors that she wants to change so as to better manage stressful communication within the family.  Is making  progress and needs to continue working with goal-directed behaviors in order to move in a forward direction. Encouraged patient in her practice of more self affirming/positive/healing behaviors as noted in session including: Emphasizing her strengths, getting outside some each day and walking, staying in touch with supportive people, saying no when she needs to say no, remain in the present focusing on what she can change or control, looking for more positives versus negatives in herself and others, refrain from assuming worst-case scenarios, healthy nutrition and exercise, more consistent positive self talk, challenging counteract her self-doubt, continue working on perfectionistic issues, and recognize the strength she shows when working with goal-directed behaviors to move in a direction that supports her improved emotional health and overall wellbeing.  Goal review and progress/challenges noted with patient.  Next appointment within 2 weeks.  This record has been created using [employer].  Chart creation errors have been sought, but may not always have been located and corrected.  Such creation errors do not reflect on the standard of medical care provided.   Shanon Ace, LCSW

## 2022-10-14 ENCOUNTER — Ambulatory Visit: Payer: 59 | Admitting: Psychiatry

## 2022-10-14 DIAGNOSIS — F411 Generalized anxiety disorder: Secondary | ICD-10-CM

## 2022-10-14 NOTE — Progress Notes (Signed)
[facility] Counselor/Therapist Progress Note  Patient ID: Vanessa Doyle, MRN: [id number],    Date: 10/14/2022  Time Spent: 55 minutes   Treatment Type: Individual Therapy  Reported Symptoms: anxiety, less depression  Mental Status Exam:  Appearance:   Casual and Neat     Behavior:  Appropriate, Sharing, and Motivated  Motor:  Normal  Speech/Language:   Clear and Coherent  Affect:  Anxious, depression decreased  Mood:  anxious and depressed  Thought process:  goal directed  Thought content:    Obsessive thoughts  Sensory/Perceptual disturbances:    WNL  Orientation:  oriented to person, place, time/date, situation, day of week, month of year, year, and stated date of October 14, 2022  Attention:  Good  Concentration:  Good  Memory:  WNL  Fund of knowledge:   Good  Insight:    Good  Judgment:   Good  Impulse Control:  Good   Risk Assessment: Danger to Self:  No Self-injurious Behavior: No Danger to Others: No Duty to Warn:no Physical Aggression / Violence:No  Access to Firearms a concern: No  Gang Involvement:No   Subjective:   Patient in today reporting anxiety as main symptom and "depression has decreased some".  Less irritability, less tearfulness, motivation improving.  Some communication issues within family addressed.  Symptoms related to job hunting, family, and personal stressors. Barre exercises continue to help "ground" her. Did bring in her writing done between sessions to share today. "Lots of friends are either getting married of having kids and I'm the only one who is not." Was able to change part of that sentence to "I feel like I'm the only one who is not."  Worked further today on this issue as another family member mentioned it to her also, and patient is very sensitive and has some struggles with self-negating. Some recognition of her "long time inferior feelings about her brother having matured more quickly than she when they were growing up." Felt  other's comments to her growing up often meant "I wasn't smart." Patient processing this more today which did seem helpful to her. Also worked more on her feelings and sensitivity  re: herself as a person and a long history of feeling "less than my peers." A lot of these feelings being acknowledged today as she states it "fits in with some of her more recent feelings within the family".  Reminded patient again that her perceptions may not always be accurate and that that can happen with any of [place], so not to necessarily take that is a negative but rather just being mindful that we can often perceive things and accurately or may be just differently from another person that does not make it right along.  To follow-up more on this next session and patient commits to doing some journaling in between appointments.  Interventions: Cognitive Behavioral Therapy and Ego-Supportive  Long term goal: Stabilize anxiety level while increasing ability to function on a daily basis, as Evidenced by patient being able to function daily without feeling her anxiety level impedes her functioning level.Sheran Fava term goal: Increase understanding of beliefs and messages that produce worry and amxiety, as Evidenced by patient clearly stating her improved understanding of negative/anxious thought patterns and how they affect how they lead to worry and anxiety. Strategies: 1)Help patient develop reality-based, positive cognitive messages. 2)Increase daily use of positive self-talk  3) refrain from assuming worst-case scenarios.  Diagnosis:   ICD-10-CM   1. Generalized anxiety disorder  F41.1      Plan:  Patient today actively participating in session and showing good motivation as she worked on her anxiety, communication especially within the family, and her tendency to jump ahead and assume "the worst".  Is definitely making progress and needs to continue working with goal-directed behaviors in order to move forward in a  positive direction. Encouraged patient in practicing more positive/healing/self affirming behaviors as noted in session including: Recognizing and emphasizing her strengths, getting outside some each day to walk, stay in touch with supportive people, saying no when she needs to say no, stay in the present focusing on what she can control or change, looking for more positives versus negatives in herself and others, refrain from assuming worst-case scenarios, healthy nutrition and exercise, more consistent positive self talk, challenge and counteract her self-doubt, continue working on perfectionistic issues, and realize the strength she shows when working with goal-directed behaviors to move in a direction that supports her improved emotional health and outlook.  Goal review and progress/challenges noted with patient.  Next appt within 2 weeks.  This record has been created using [employer].  Chart creation errors have been sought, but may not always have been located and corrected.  Such creation errors do not reflect on the standard of medical care provided.   Shanon Ace, LCSW

## 2022-10-20 ENCOUNTER — Ambulatory Visit: Payer: 59 | Admitting: Psychiatry

## 2022-10-20 DIAGNOSIS — F411 Generalized anxiety disorder: Secondary | ICD-10-CM | POA: Diagnosis not present

## 2022-10-20 NOTE — Progress Notes (Signed)
[facility] Counselor/Therapist Progress Note  Patient ID: Vanessa Doyle, MRN: [id number],    Date: 10/20/2022  Time Spent: 55 minutes   Treatment Type: Individual Therapy  Reported Symptoms: anxiety, less depression  Mental Status Exam:  Appearance:   Casual     Behavior:  Appropriate, Sharing, and Motivated  Motor:  Normal  Speech/Language:   Clear and Coherent  Affect:  anxious  Mood:  anxious  Thought process:  goal directed  Thought content:    Some obsessive thoughts  Sensory/Perceptual disturbances:    WNL  Orientation:  oriented to person, place, time/date, situation, day of week, month of year, year, and stated date of October 20, 2022  Attention:  Fair  Concentration:  Good and Fair  Memory:  [place] of knowledge:   Good  Insight:    Good and Fair  Judgment:   Good  Impulse Control:  Good   Risk Assessment: Danger to Self:  No Self-injurious Behavior: No Danger to Others: No Duty to Warn:no Physical Aggression / Violence:No  Access to Firearms a concern: No  Gang Involvement:No   Subjective:  Patient in session today reporting anxiety and depression continues to be decreased. Feels that some of her "letting go" is helping her in decreasing her depression. Anxiety has been more difficult to decrease and maintain that decrease. Ongoing family situations/relationships which she processes more today and looking at what's in her control and what is not. 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 Stressful situations at home processed more today. Reports communication within family is challenging, mostly between her and mom. Rates her communication (mostly with mom) and felt it was hard to communicate with her, and patient worked in session today on communication especially with her mother. Feels that "not communicating my thoughts very well" has created some difficulty in relationship with mother especially,  and they are in midst of working on improving their relationship with special focus on their communication. Trying not to compare her self to other people, nor feeling "less than" others, and be able to notice her strengths in addition to  her "need areas".   Interventions: Cognitive Behavioral Therapy and Ego-Supportive  Long term goal: Stabilize anxiety level while increasing ability to function on a daily basis, as Evidenced by patient being able to function daily without feeling her anxiety level impedes her functioning level.Sheran Fava term goal: Increase understanding of beliefs and messages that produce worry and amxiety, as Evidenced by patient clearly stating her improved understanding of negative/anxious thought patterns and how they affect how they lead to worry and anxiety. Strategies: 1)Help patient develop reality-based, positive cognitive messages. 2)Increase daily use of positive self-talk  3) refrain from assuming worst-case scenarios.  Diagnosis:   ICD-10-CM   1. Generalized anxiety disorder  F41.1      Plan:   Patient today motivated and participating well in session as she continued working on her anxiety and multiple areas of her life, her communication within the family, and more efforts being made to "not assume the worst".  Is making progress and needs to continue her work with goal-directed behaviors in order to keep moving forward and a positive direction. Encouraged patient in her practice of more positive/healing/self affirming behaviors as noted in session including: Staying in touch with supportive people, recognizing and emphasizing her strengths, getting outside some each day to walk, saying no when she needs to say no, remain in the  present focusing on what she can control or change, look for more positives versus negatives in herself and others, refrain from assuming worst-case scenarios, healthy nutrition and exercise, more consistent positive self talk, challenge  and counteract her self-doubt, continue working on perfectionistic issues, and realize the strength she shows when working with goal-directed behaviors to move in a direction that supports her improved emotional health and overall wellbeing.  Goal review and progress/challenges noted with patient.   Next appt within 2 weeks.   This record has been created using [employer].  Chart creation errors have been sought, but may not always have been located and corrected.  Such creation errors do not reflect on the standard of medical care provided.   Shanon Ace, LCSW

## 2022-10-28 ENCOUNTER — Ambulatory Visit: Payer: 59 | Admitting: Psychiatry

## 2022-10-28 DIAGNOSIS — F411 Generalized anxiety disorder: Secondary | ICD-10-CM

## 2022-10-28 NOTE — Progress Notes (Signed)
[facility] Counselor/Therapist Progress Note  Patient ID: Vanessa Doyle, MRN: [id number],    Date: 10/28/2022  Time Spent: 55 minutes   Treatment Type: Individual Therapy  Reported Symptoms: anxiety, depression (decreased), obsessive thoughts (improving)  Mental Status Exam:  Appearance:   Casual and Neat     Behavior:  Appropriate, Sharing, and Motivated  Motor:  Normal  Speech/Language:   Clear and Coherent  Affect:  anxious  Mood:  anxious  Thought process:  goal directed  Thought content:    Obsessive thoughts "but  improving"  Sensory/Perceptual disturbances:    WNL  Orientation:  oriented to person, place, time/date, situation, day of week, month of year, year, and stated date of October 28, 2022  Attention:  Good  Concentration:  Good  Memory:  WNL  Fund of knowledge:   Good  Insight:    Good and Fair  Judgment:   Good  Impulse Control:  Good and Fair   Risk Assessment: Danger to Self:  No Self-injurious Behavior: No Danger to Others: No Duty to Warn:no Physical Aggression / Violence:No  Access to Firearms a concern: No  Gang Involvement:No   Subjective:   Patient in today reporting anxiety, some obsessive thoughts but they are decreasing, and depression which is also showing some decrease. Continues to work on "letting go" of things that hold her back  including some of her obsessive thoughts, and worked on this more in session today and being able to talk back to those thoughts. Used [employer] in office to work further on relationship issues with parents, particularly her mother, and related how she felt similar family stress even as a child. Identified her issues of struggling when she cannot control everything. Patient and parents all working to improve their communication and their relationships. Working on communicating her needs and feelings in more helpful and understanding ways. Is noticing some improvement "and relief" in her mood.  Making some progress initially with not comparing herself to others, being able to recognize her own strengths, and not feeling that she is "less than" others.  Interventions: Cognitive Behavioral Therapy and Ego-Supportive  Long term goal: Stabilize anxiety level while increasing ability to function on a daily basis, as Evidenced by patient being able to function daily without feeling her anxiety level impedes her functioning level.Sheran Fava term goal: Increase understanding of beliefs and messages that produce worry and amxiety, as Evidenced by patient clearly stating her improved understanding of negative/anxious thought patterns and how they affect how they lead to worry and anxiety. Strategies: 1)Help patient develop reality-based, positive cognitive messages. 2)Increase daily use of positive self-talk  3) refrain from assuming worst-case scenarios.  Diagnosis:   ICD-10-CM   1. Generalized anxiety disorder  F41.1      Plan:   Patient actively participating and showing good motivation in session today as she worked on personal and family issues impacting her anxiety, obsessive thoughts, and depression.  As noted above the depression and obsessive thoughts are decreasing some.  Did really well today and working with the relationship issues involving family relationships, and also her personal issues especially with "not being able to control everything".  Some improvement in mood.  Making progress and needs to continue her work with goal-directed behaviors to keep moving in a forward direction. Encouraged patient and practicing more positive/healing/self affirming behaviors as noted in session including: Remain in touch with supportive people, recognize and emphasize her strengths, getting outside some each day  and walking, saying no when she needs to say no, stay in the present focusing on what she can control or change, looking for more positives versus negatives in herself and others, refrain  from assuming worst-case scenarios, healthy nutrition and exercise, more consistent positive self talk, challenging counteract her self-doubt, continue working on perfectionistic issues, and recognize the strengths she shows when working with goal-directed behaviors to move in a direction that supports her improved emotional health and outlook.  Self rated scales: 1-10 depression scale- 3 1-10 anxiety scale- 8  Goal review and progress/challenges noted with patient.  Next appointment within 2 weeks.  This record has been created using [employer].  Chart creation errors have been sought, but may not always have been located and corrected.  Such creation errors do not reflect on the standard of medical care provided.   Shanon Ace, LCSW

## 2022-11-05 ENCOUNTER — Ambulatory Visit: Payer: 59 | Admitting: Psychiatry

## 2022-11-05 DIAGNOSIS — F411 Generalized anxiety disorder: Secondary | ICD-10-CM

## 2022-11-05 NOTE — Progress Notes (Signed)
[facility] Counselor/Therapist Progress Note  Patient ID: Vanessa Doyle, MRN: [id number],    Date: 11/05/2022  Time Spent: 55 minutes   Treatment Type: Individual Therapy  Reported Symptoms:  anxiety, depression, irritable, frustration, decreased energy  Mental Status Exam:  Appearance:   Casual and Neat     Behavior:  Appropriate, Sharing, and Motivated  Motor:  Normal  Speech/Language:   Clear and Coherent  Affect:  Depressed and anxious  Mood:  anxious and depressed  Thought process:  goal directed  Thought content:    Obsessive thoughts  Sensory/Perceptual disturbances:    WNL  Orientation:  oriented to person, place, time/date, situation, day of week, month of year, year, and stated date of November 05, 2022  Attention:  Good  Concentration:  Good and Fair  Memory:  WNL  Fund of knowledge:   Good  Insight:    Good and Fair  Judgment:   Good and Fair  Impulse Control:  Good   Risk Assessment: Danger to Self:  No Self-injurious Behavior: No Danger to Others: No Duty to Warn:no Physical Aggression / Violence:No  Access to Firearms a concern: No  Gang Involvement:No   Subjective:   Patient in session today reporting anxiety, frustration, irritability, and depression. Also decreased energy. Reports symptoms are related to personal and family issues. Obsessive thoughts re: family and extended family. Work is more stressful but feels her emotional symptoms are more a result of her personal and family stressors/relationships. Needing time in session today to focus more on family dynamics and the difficulty patient is experiencing. Shared vivid memories of a bad situation several yrs ago at Thanksgiving that still affects her now when around extended family, but not so much in her dad's side of family. Processed negative thoughts and some memories that she still finds concerning. Finds that she gets things "on her mind and it makes me tired/exhausted. Does feel she is able  to work better with her thoughts and try not letting them consume her as easily. Also plans to talk softer in midst of family to see if that my help her communication with them. Confronting more of her obsessive thoughts and working through some of them, using [employer] in office, and realizing more how her thoughts/feelings/behaviors are all related.  Discussed upcoming weekend and family activity plan, and how patient might manage this time better since some of her stress has been related to family events.  Continues to work on improving communication and relationships, and how she feels within the family group.  Trying to improve her communication of feelings and needs.  Less comparison of herself to others.  Continue to work on recognizing her own strengths and reduce the feelings of being "less than other people".  Good motivation and effort today.  Interventions: Cognitive Behavioral Therapy   Long term goal: Stabilize anxiety level while increasing ability to function on a daily basis, as Evidenced by patient being able to function daily without feeling her anxiety level impedes her functioning level.Sheran Fava term goal: Increase understanding of beliefs and messages that produce worry and amxiety, as Evidenced by patient clearly stating her improved understanding of negative/anxious thought patterns and how they affect how they lead to worry and anxiety. Strategies: 1)Help patient develop reality-based, positive cognitive messages. 2)Increase daily use of positive self-talk  3) refrain from assuming worst-case scenarios.   Diagnosis:   ICD-10-CM   1. Generalized anxiety disorder  F41.1  Plan:  Patient showing good motivation and participation in session today as she worked further on her anxiety, some depression, and obsessive thoughts mostly related to family and personal issues.  Patient has become more open in her talking which is helping her gain a little more  self-confidence around these issues.  Is making progress and needs to continue working with goal-directed behaviors so as to keep moving and in a positive direction. Encouraged patient in practicing more positive/self affirming/healing behaviors as noted in session including: Recognize and emphasize her strengths, getting outside some each day and walking, stay in touch with supportive people, saying no when she needs to say no, remain in the present focusing on what she can change or control, looking for more positives versus negatives in herself and others, refrain from assuming worst-case scenarios, healthy nutrition and exercise, positive self talk more consistently, challenge and counteract her self-doubt, refrain from making assumptions about what others are thinking and instead asked them, continue working on perfectionistic issues, and realize the strength she shows working with goal-directed behaviors to move in a direction that supports her improved emotional health and overall wellbeing.  Self rated scales: 1-10 depression scale- 4 1-10 anxiety scale- 7  Goal review of progress/challenges noted with patient.  Next appointment within 2 weeks.  This record has been created using [employer].  Chart creation errors have been sought, but may not always have been located and corrected.  Such creation errors do not reflect on the standard of medical care provided.   Shanon Ace, LCSW

## 2022-11-10 ENCOUNTER — Ambulatory Visit: Payer: 59 | Admitting: Psychiatry

## 2022-11-10 DIAGNOSIS — F411 Generalized anxiety disorder: Secondary | ICD-10-CM

## 2022-11-10 NOTE — Progress Notes (Signed)
[facility] Counselor/Therapist Progress Note  Patient ID: Vanessa Doyle, MRN: [id number],    Date: 11/10/2022  Time Spent: 55 minutes   Treatment Type: Individual Therapy  Reported Symptoms:  anxiety, decreased depression,  Mental Status Exam:  Appearance:   Casual     Behavior:  Appropriate, Sharing, and Motivated  Motor:  Normal  Speech/Language:   Clear and Coherent  Affect:  Anxious, some depression  Mood:  anxious and depressed  Thought process:  goal directed  Thought content:    Obsessive thoughts but noted some progress  Sensory/Perceptual disturbances:    WNL  Orientation:  oriented to person, place, time/date, situation, day of week, month of year, year, and stated date of November 10, 2022  Attention:  Good  Concentration:  Good  Memory:  WNL  Fund of knowledge:   Good  Insight:    Good  Judgment:   Good  Impulse Control:  Good and Fair   Risk Assessment: Danger to Self:  No Self-injurious Behavior: No Danger to Others: No Duty to Warn:no Physical Aggression / Violence:No  Access to Firearms a concern: No  Gang Involvement:No   Subjective:  Patient in session today and reporting anxiety, frustration, with depression having decreased some over past week. Did homework between sessions and shared her writing. "I perceive that there is distance and dis-engagement between me and some people, including family and beyond, sometimes stronger than at other times." That make her nervous and she reports that she over-compensates to "fill that void". Is putting into practice "not filling the void" and other behavior/thought changes we have been talking about in prior therapy sessions. Worked on this more in session today. Showing more insight today. Much less irritability today. Obsessive thought "don't seem quite as bad today and they can come and go." Realizing "maybe my judgment of what I'm saying may be inaccurate and working to not judge herself." "Tired of people in  family looking at me as "not an adult" and feel they judge her. Continues trying not to let her thoughts consume her, as she works to change some of them to less self-negating. Trying to communicate needs better, and comparing self to others is decreasing, which. Practicing some communication of needs and feelings. Reports less comparison of self to others.              Interventions: Cognitive Behavioral Therapy and Ego-Supportive  Long term goal: Stabilize anxiety level while increasing ability to function on a daily basis, as Evidenced by patient being able to function daily without feeling her anxiety level impedes her functioning level.Sheran Fava term goal: Increase understanding of beliefs and messages that produce worry and amxiety, as Evidenced by patient clearly stating her improved understanding of negative/anxious thought patterns and how they affect how they lead to worry and anxiety. Strategies: 1)Help patient develop reality-based, positive cognitive messages. 2)Increase daily use of positive self-talk  3) refrain from assuming worst-case scenarios.  Diagnosis:   ICD-10-CM   1. Generalized anxiety disorder  F41.1      Plan:  Patient today showing good participation and motivation in session as she continues working on her anxiety, depression, and some self-esteem issues. Is making progress and needs to continue working with goal-directed behaviors to keep moving forward in a positive direction.  Making some noticeable progress and how she views herself which is very encouraging to her. Encouraged patient in her practice of more positive/self affirming/healing behaviors as noted in session including:  Stay in the present focusing on what she can change or control, recognize and emphasize her strengths, getting outside some each day and walking, remain in contact with supportive people, saying no when she needs to say no, look more for positives versus negatives in herself and others,  refrain from assuming worst-case scenarios, positive self talk more consistently, healthy nutrition and exercise, challenge and counteract her self-doubt, refrain from making assumptions about what others are thinking and instead ask them, continue working on perfectionistic issues, and recognize the strength she shows working with goal-directed behaviors to move in a direction that supports her improved emotional health and outlook.  Self rated scales: 1-10 depression scale- 3 1-10 anxiety scale- 6/7  Goal review and progress/challenges noted with patient.  Next appointment within 2 weeks.  This record has been created using [employer].  Chart creation errors have been sought, but may not always have been located and corrected.  Such creation errors do not reflect on the standard of medical care provided.   Shanon Ace, LCSW

## 2022-11-24 ENCOUNTER — Ambulatory Visit: Payer: 59 | Admitting: Psychiatry

## 2022-11-26 ENCOUNTER — Ambulatory Visit (INDEPENDENT_AMBULATORY_CARE_PROVIDER_SITE_OTHER): Payer: 59 | Admitting: Psychiatry

## 2022-11-26 DIAGNOSIS — F411 Generalized anxiety disorder: Secondary | ICD-10-CM

## 2022-11-26 NOTE — Progress Notes (Signed)
[facility] Counselor/Therapist Progress Note  Patient ID: Vanessa Doyle, MRN: [id number],    Date: 11/26/2022  Time Spent: 55 minutes   Treatment Type: Individual Therapy  Reported Symptoms: anxiety, obsessive thoughts "but not as often"  Mental Status Exam:  Appearance:   Casual and Neat     Behavior:  Appropriate, Sharing, and Motivated  Motor:  Normal  Speech/Language:   Clear and Coherent  Affect:  anxious  Mood:  anxious  Thought process:  goal directed  Thought content:    Obsessive thoughts  Sensory/Perceptual disturbances:    WNL  Orientation:  oriented to person, place, time/date, situation, day of week, month of year, year, and stated date of November 26, 2022  Attention:  Good  Concentration:  Fair  Memory:  WNL  Fund of knowledge:   Good  Insight:    Good  Judgment:   Good  Impulse Control:  Good   Risk Assessment: Danger to Self:  No Self-injurious Behavior: No Danger to Others: No Duty to Warn:no Physical Aggression / Violence:No  Access to Firearms a concern: No  Gang Involvement:No   Subjective:   Patient in today and reporting anxiety, obsessive thoughts "but not as bad".  Realizing that adulting is challenging and is showing more effort and some successes. Also some challenges, which she discussed more in session today, sharing "what is going well and what is not going so well, what she can influence and what she cannot." Some more calmness at home with parents more recently with less anxiety. Uses green noise "versus white noise box" to calm self. Able to tolerate "not having to control everything". States "I'm better also at tolerating imperfection." Worked more today on her tendencies around "control and perfection" and they're not as intense as they used to be. Able to see some of her gains and wanting to progress further. Good motivation. Obsessive thoughts "decreased some but still working on that." Processed more of her feelings that people in  the family don't always see her as an adult. Admits having some issues with another adult family member and shared how she is wanting to forgive and move forward. Agreed to work on this more next session as "I don't want it consuming so much of my thoughts." Agreed to follow up on this more next session. Progressing.  Interventions: Cognitive Behavioral Therapy and Ego-Supportive  Long term goal: Stabilize anxiety level while increasing ability to function on a daily basis, as Evidenced by patient being able to function daily without feeling her anxiety level impedes her functioning level.[name] term goal: Increase understanding of beliefs and messages that produce worry and amxiety, as Evidenced by patient clearly stating her improved understanding of negative/anxious thought patterns and how they affect how they lead to worry and anxiety. Strategies: 1)Help patient develop reality-based, positive cognitive messages. 2)Increase daily use of positive self-talk  3) refrain from assuming worst-case scenarios.  Diagnosis:   ICD-10-CM   1. Generalized anxiety disorder  F41.1      Plan: Patient showing good motivation and active participation in session today as she focused on her anxiety, obsessive thoughts, and some letting go of "baggage" from certain relationships. Is making progress and needs to continue her work with goal-directed behaviors to keep moving in a forward direction. Her progress continues to be encouraging for her.  Encouraged patient and practicing more positive/self affirming/healing behaviors as discussed in session including: Recognize and emphasize her strengths, getting outside some each day  and walking, stay in the present focusing on what she can change or control, remain in contact with supportive people, saying no when she needs to say no, look more for positives versus negatives in herself and others, refrain from assuming worst-case scenarios, positive self talk more  consistently, healthy nutrition and exercise, challenging counteract her self-doubt, refrain from making assumptions about what others are thinking and instead asked them, continue working on perfectionistic issues, and recognize the strength she shows when working with goal-directed behaviors to move in a direction that supports her overall improved emotional health and wellbeing.  Self rated scales: 1-10 depression scale- 2 1-10 anxiety scale- 5/6 1-10 hopefulness scale-6  Goal review and progress/challenges noted with patient.  Next appointment within 2 weeks.  This record has been created using [employer].  Chart creation errors have been sought, but may not always have been located and corrected.  Such creation errors do not reflect on the standard of medical care provided.   [name], LCSW

## 2022-12-03 ENCOUNTER — Ambulatory Visit: Payer: 59 | Admitting: Psychiatry

## 2022-12-03 DIAGNOSIS — F411 Generalized anxiety disorder: Secondary | ICD-10-CM | POA: Diagnosis not present

## 2022-12-03 NOTE — Progress Notes (Signed)
[facility] Counselor/Therapist Progress Note  Patient ID: Vanessa Doyle, MRN: [id number],    Date: 12/03/2022  Time Spent: 50 minutes   Treatment Type: Individual Therapy  Reported Symptoms: anxiety, difficult feelings within family  Mental Status Exam:  Appearance:   Casual and Neat     Behavior:  Appropriate, Sharing, and Motivated  Motor:  Normal  Speech/Language:   Clear and Coherent  Affect:  anxious  Mood:  anxious  Thought process:  goal directed  Thought content:    WNL  Sensory/Perceptual disturbances:    WNL  Orientation:  oriented to person, place, time/date, situation, day of week, month of year, year, and stated date of December 03, 2022  Attention:  Fair  Concentration:  Good  Memory:  WNL  Fund of knowledge:   Good  Insight:    Good and Fair  Judgment:   Good  Impulse Control:  Good   Risk Assessment: Danger to Self:  No Self-injurious Behavior: No Danger to Others: No Duty to Warn:no Physical Aggression / Violence:No  Access to Firearms a concern: No  Gang Involvement:No   Subjective:  Patient today reporting anxiety as her main symptom and obsessive thoughts remain decreased. Continues to state "adulting is a challenge". Working on some communication issues more today, especially with her parents and her brother/his wife. Paying more attention and working on tolerating when "I can't control everything" but also noticing how stress she is even when she has more control over some things. Working towards accepting that I can't control everything. I sometimes have trouble distinguishing what I can control ad what I can't. "Control is still a pretty big issue for me."  I'd "like to let go of control and it be effortless." Processed her thoughts more about "not being seen as an adult within the family."Issues within family and some lingering thoughts from things that occurred at brother's wedding discussed today.  "And I do realize my difficulty in not giving  them so power and attention, "but has hard time letting go and moving forward." Reports being more intentional in her thoughts and trying not to automatically assume the worst.  Interventions: Cognitive Behavioral Therapy and Ego-Supportive  Long term goal: Stabilize anxiety level while increasing ability to function on a daily basis, as Evidenced by patient being able to function daily without feeling her anxiety level impedes her functioning level.[name] term goal: Increase understanding of beliefs and messages that produce worry and amxiety, as Evidenced by patient clearly stating her improved understanding of negative/anxious thought patterns and how they affect how they lead to worry and anxiety. Strategies: 1)Help patient develop reality-based, positive cognitive messages. 2)Increase daily use of positive self-talk  3) refrain from assuming worst-case scenarios.  Diagnosis:   ICD-10-CM   1. Generalized anxiety disorder  F41.1      Plan: Patient today showing good participation and motivation working further on her anxiety and communication issues particularly within family relationships. Not as depressed and smiling more. Is making progress and needs to continue working with goal-directed behaviors to keep moving in a forward direction. Encouraged patient in her practice of more positive/self affirming/healing behaviors as discussed in session including: Recognize and emphasize her strengths, get outside some each day and walk, stay in the present focusing on what she can change or control, remain in contact with supportive people, saying no when she needs to say no, look more for positives versus negatives in herself and others, refrain from assuming  worst-case scenarios, positive self talk, healthy nutrition and exercise, challenge and counteract her self-doubt, refrain from making assumptions about what others are thinking and instead ask them, continue working on perfectionistic issues,  and recognize the strengths she shows working with goal-directed behaviors to move in a direction that supports her overall improved emotional health and outlook.  Self rated scales: 1-10 depression scale- 3 1-10 anxiety scale- 6 1-10 hopefulness scale-7 1-10 scale of how she is feeling within family- 6/7 (with 1 being little difficulty)  Goal review and progress/challenges noted with patient.  Next appointment within 2 weeks.  This record has been created using [employer].  Chart creation errors have been sought, but may not always have been located and corrected.  Such creation errors do not reflect on the standard of medical care provided.   [name], LCSW

## 2022-12-08 ENCOUNTER — Ambulatory Visit: Payer: 59 | Admitting: Psychiatry

## 2022-12-08 DIAGNOSIS — F411 Generalized anxiety disorder: Secondary | ICD-10-CM

## 2022-12-08 NOTE — Progress Notes (Signed)
[facility] Counselor/Therapist Progress Note  Patient ID: Vanessa Doyle, MRN: [id number],    Date: 12/08/2022  Time Spent: 50 minutes   Treatment Type: Individual Therapy  Reported Symptoms: anxiety  Mental Status Exam:  Appearance:   Neat     Behavior:  Appropriate, Sharing, and Motivated  Motor:  Normal  Speech/Language:   Clear and Coherent  Affect:  anxious  Mood:  anxious and some depression  Thought process:  goal directed  Thought content:    Obsessive thoughts decreased "some"  Sensory/Perceptual disturbances:    WNL  Orientation:  oriented to person, place, time/date, situation, day of week, month of year, year, and stated date of December 08, 2022  Attention:  Good  Concentration:  Good and Fair  Memory:  WNL  Fund of knowledge:   Good  Insight:    Good  Judgment:   Good  Impulse Control:  Good   Risk Assessment: Danger to Self:  No Self-injurious Behavior: No Danger to Others: No Duty to Warn:no Physical Aggression / Violence:No  Access to Firearms a concern: No  Gang Involvement:No   Subjective:  Patient in today reporting anxiety as main symptom with "some depression". Symptoms mostly related to personal, family, and her future. Some more social this past weekend and actually attended a party at friend's home. Was somewhat nervous "but it was ok and felt better as time went on." Becoming more self-aware in conversations, trying to be more patient with herself and others.  Getting better "at leaving work stuff at work". Focusing more on communication within family especially in larger family group and will be having another family event in mid-May "which will be a test for me." Continued work on adapting better when "she is not in control" of everything. Traveling to [place] soon for work and anticipating positives, which patient noticed was a change for her.  Worked further on "control issues" which she reports is relevant to family issues and tension she  senses. "I get so stressed out about trying not to stress out, which leads to being more stressed.  Worked with this especially in session today and patient showing increased insight.  Relationship with brother not close and we fought a lot when younger, and patient noting the changes over the years of their growing up. Feels brother and wife both speak to her in a harsh tone and she is working on not personalizing that and realizing "it's not about me." Worked  with several examples of this in session today and to follow up with homework re: her assumptions and communication issues with brother and his wife.   Interventions: Cognitive Behavioral Therapy and Ego-Supportive  Long term goal: Stabilize anxiety level while increasing ability to function on a daily basis, as Evidenced by patient being able to function daily without feeling her anxiety level impedes her functioning level.Vanessa Doyle term goal: Increase understanding of beliefs and messages that produce worry and amxiety, as Evidenced by patient clearly stating her improved understanding of negative/anxious thought patterns and how they affect how they lead to worry and anxiety. Strategies: 1)Help patient develop reality-based, positive cognitive messages. 2)Increase daily use of positive self-talk  3) refrain from assuming worst-case scenarios.  Diagnosis:   ICD-10-CM   1. Generalized anxiety disorder  F41.1      Plan:  Patient today actively participating in session today and working on more of her control issues, her tendency to look for what might go wrong versus right,  her communication with brother and his wife and how she perceives their communication with her, and how all of this affects her anxiety and stress level.  Insight is improving some gradually.  Not as depressed.  Seemed more rested today and I encouraged her to go to bed earlier so as to get more sleep.  Patient is making progress and needs to continue with goal-directed  behaviors to keep moving in a forward direction. Encouraged patient in practicing more positive/self-affirming/healing behaviors as discussed in sessions including: Recognizing and emphasizing her strengths, getting outside some each day and walking, staying in the present focusing on what she can change or control, stay in contact with supportive people, saying no when she needs to say no, look more for positives versus negatives in herself and others, refrain from assuming worst-case scenarios, positive self talk, healthy nutrition and exercise, challenging counteract her self-doubt, refrain from making assumptions about what others are thinking and instead asked them, continue working on perfectionistic issues, and realize the strength she shows working with goal-directed behaviors to move in a direction that supports her overall improved emotional health and outlook.  Self rated scales: 1-10 depression scale- 2 1-10 anxiety scale- 6 1-10 hopefulness scale-7 1-10 scale of how she is feeling within family- 6/7 (with 1 being little difficulty)  Goal review of progress/challenges noted with patient.  Next appointment within 2 weeks.   [name], LCSW

## 2022-12-16 ENCOUNTER — Ambulatory Visit (INDEPENDENT_AMBULATORY_CARE_PROVIDER_SITE_OTHER): Payer: 59 | Admitting: Psychiatry

## 2022-12-16 DIAGNOSIS — F411 Generalized anxiety disorder: Secondary | ICD-10-CM | POA: Diagnosis not present

## 2022-12-16 NOTE — Progress Notes (Signed)
[facility] Counselor/Therapist Progress Note  Patient ID: Vanessa Doyle, MRN: [id number],    Date: 12/16/2022  Time Spent: 55 minutes   Treatment Type: Individual Therapy  Reported Symptoms: anxiety, some depression but improving  Mental Status Exam:  Appearance:   Neat     Behavior:  Appropriate, Sharing, and Motivated  Motor:  Normal  Speech/Language:   Clear and Coherent  Affect:  Anxious, some depression  Mood:  anxious  Thought process:  goal directed  Thought content:    Some obsessive thoughts but also improving  Sensory/Perceptual disturbances:    WNL  Orientation:  oriented to person, place, time/date, situation, day of week, month of year, year, and stated date of Dec 16, 2022  Attention:  Good  Concentration:  Good  Memory:  WNL  Fund of knowledge:   Good  Insight:    Good  Judgment:   Good  Impulse Control:  Good   Risk Assessment: Danger to Self:  No Self-injurious Behavior: No Danger to Others: No Duty to Warn:no Physical Aggression / Violence:No  Access to Firearms a concern: No  Gang Involvement:No   Subjective:  Patient in for session today reporting anxiety and some decreasing depression. Reports symptoms are mostly related to work, personal life. Not as many family stressors "but I've been out of town for work and just recently returned." Was a good time away for patient even though she was working for a [employer] in [place]. Returned home this weekend and finding some increased work and family stressors. "Needing to created a better work/life balance to prevent burnout and feel better about how I'm performing at work. Hard time validating herself in prior work/school experience, feeling she wasn't smart enough comparing to other students. Does feel she is working to be able to validate herself and focused with patient on this part of her life as she shared how she is wanting to change. Working to let go of previous negative ways of looking at herself  and has struggled with this even as early as elementary school and extended through college and even into her work environment now. Working to be more patient with self and others. Continues work on her relationship with brother and his wife and a family "gathering this weekend may give me time to practice some of my work within context of family and practice some of the strategies we're discussing in sessions here." Showing some increase in self-confidence today as well as sticking with topics even though they are sensitive for her.  Interventions: Cognitive Behavioral Therapy and Ego-Supportive  Long term goal: Stabilize anxiety level while increasing ability to function on a daily basis, as Evidenced by patient being able to function daily without feeling her anxiety level impedes her functioning level.[name] term goal: Increase understanding of beliefs and messages that produce worry and amxiety, as Evidenced by patient clearly stating her improved understanding of negative/anxious thought patterns and how they affect how they lead to worry and anxiety. Strategies: 1)Help patient develop reality-based, positive cognitive messages. 2)Increase daily use of positive self-talk  3) refrain from assuming worst-case scenarios.  Diagnosis:   ICD-10-CM   1. Generalized anxiety disorder  F41.1      Plan:  Patient participating well in session today as she worked further on some difficult family relationships and communication issues, controlled-related issues, as these significantly impact her anxiety/depression and stress level.  Her insight does seem to be gradually improving and patient is motivated  to work on changes.  Depression has decreased some.  Self-esteem improving some.  Is making progress and needs to continue her work with goal-directed behaviors as she moves in a forward direction and starting to show more belief in herself. Encouraged patient in her practice of more positive/self  affirming/healing behaviors as noted in sessions including: Staying in the present focusing on what she can change her control, staying in contact with supportive people, recognizing and emphasizing her strengths, being outside some each day and walking, saying no when she needs to say no, looking more for her positives versus negatives, refrain from assuming worst-case scenarios, positive self talk, healthy nutrition and exercise, challenge and counteract her self-doubt, refrain from making assumptions about what others might be thinking and instead ask them, continue working on perfectionistic and control issues, and recognize the strength she shows when working with goal-directed behaviors to move in a direction that supports her overall improved emotional health and wellbeing.  Self rated scales: 1-10 depression scale- 2 1-10 anxiety scale- 6 1-10 hopefulness scale-7 1-10 scale of how she is feeling within family- 6/7 (with 1 being little difficulty)  Goal review and progress/challenges noted with patient.  Next appt within 1-2 weeks.   [name], LCSW

## 2022-12-23 ENCOUNTER — Ambulatory Visit: Payer: 59 | Admitting: Psychiatry

## 2022-12-23 DIAGNOSIS — F411 Generalized anxiety disorder: Secondary | ICD-10-CM

## 2022-12-23 NOTE — Progress Notes (Signed)
[facility] Counselor/Therapist Progress Note  Patient ID: Vanessa Doyle, MRN: [id number],    Date: 12/23/2022  Time Spent: 55 minutes   Treatment Type: Individual Therapy  Reported Symptoms: anxious, "occasional difficulty sleeping but eventually does fall asleep and often happens on days that have been more stressful (also reports "noise boxes like while noise etc)", some depression but decreased  Mental Status Exam:  Appearance:   Casual     Behavior:  Appropriate, Sharing, and Motivated  Motor:  Normal  Speech/Language:   Clear and Coherent  Affect:  anxious  Mood:  anxious  Thought process:  goal directed  Thought content:    Obsessive thoughts "but improving"  Sensory/Perceptual disturbances:    WNL  Orientation:  oriented to person, place, time/date, situation, day of week, month of year, year, and stated date of Dec 23, 2022  Attention:  Good  Concentration:  Good  Memory:  WNL  Fund of knowledge:   Good  Insight:    Good  Judgment:   Good  Impulse Control:  Good   Risk Assessment: Danger to Self:  No Self-injurious Behavior: No Danger to Others: No Duty to Warn:no Physical Aggression / Violence:No  Access to Firearms a concern: No  Gang Involvement:No   Subjective:   Patient in today reporting anxiety as main symptom, and some depression but it has decreased. Occasional difficulty sleeping but usually happening at more stressful times and patient states using noise boxes like white noise helps her get to sleep. Family gatherings can be stressful and there is one she thinks is going to happen this weekend, grandfather's 90th birthday, and these type of events tend to create anxiety for patient. Did process this today and looked at some ways she might better manage her anxiety and feel more comfortable within the family group. Also focused on patient not looking to far ahead and assuming worst-case scenarios, and instead, focus on what she can do in terms of  thoughts and behaviors that can benefit her in family environment. Talked through more of her concerns about having a better work/life balance. Did have job interview recently with company in [place], [place] and to hear back soon. Worked further on personal validation currently of herself and her job, and trying to "get better in letting go of things out of my control." Less self-negating today.   Interventions: Cognitive Behavioral Therapy and Ego-Supportive  Long term goal: Stabilize anxiety level while increasing ability to function on a daily basis, as Evidenced by patient being able to function daily without feeling her anxiety level impedes her functioning level.[name] term goal: Increase understanding of beliefs and messages that produce worry and amxiety, as Evidenced by patient clearly stating her improved understanding of negative/anxious thought patterns and how they affect how they lead to worry and anxiety. Strategies: 1)Help patient develop reality-based, positive cognitive messages. 2)Increase daily use of positive self-talk  3) refrain from assuming worst-case scenarios.  Diagnosis:   ICD-10-CM   1. Generalized anxiety disorder  F41.1      Plan:  Patient today showing good participation in session with continued work on anxiety, communication issues, decision-making into the future, and helping strengthen family relationships and trust. Insight good today and stays motivated. Is making progress and needs to continue working with goal directed behaviors to move in a forward direction.Encouraged patient in practicing more positive/healing/self affirming behaviors as noted in sessions including: Remaining in the present focusing on what she can change or  control, staying in contact with supportive people, recognize and emphasize her strengths, being outside some each day and walking, saying no when she needs to say no, looking more for positives versus negatives, refrain from assuming  worst-case scenarios, positive self talk, challenging counteract her self-doubt, healthy nutrition and exercise, refrain from making assumptions about what others might be thinking and instead asked them, continue working on perfectionistic and control issues, and realize the strength she shows when working with goal-directed behaviors to move in a direction that supports her overall improved emotional health and wellbeing.  Self rated scales: 1-10 depression scale- 2 1-10 anxiety scale- 6 1-10 hopefulness scale-7 1-10 scale of how she is feeling within family- 6/7 (with 1 being little difficulty)   Goal review and progress/challenges noted with patient.  Next appt within 2 weeks.   [name], LCSW

## 2022-12-30 ENCOUNTER — Ambulatory Visit: Payer: 59 | Admitting: Psychiatry

## 2022-12-30 DIAGNOSIS — F411 Generalized anxiety disorder: Secondary | ICD-10-CM | POA: Diagnosis not present

## 2022-12-30 NOTE — Progress Notes (Signed)
[facility] Counselor/Therapist Progress Note  Patient ID: Vanessa Doyle, MRN: [id number],    Date: 12/30/2022  Time Spent: 55  minutes   Treatment Type: Individual Therapy  Reported Symptoms: anxiety, some improved communication at home  Mental Status Exam:  Appearance:   Casual     Behavior:  Appropriate, Sharing, and Motivated  Motor:  Normal  Speech/Language:   Clear and Coherent  Affect:  anxious  Mood:  anxious  Thought process:  goal directed  Thought content:    Some better this week "noticed some decrease in obsessive thoughts"  Sensory/Perceptual disturbances:    WNL  Orientation:  oriented to person, place, time/date, situation, day of week, month of year, year, and stated date of Dec 30, 2022  Attention:  Good  Concentration:  Good  Memory:  WNL  Fund of knowledge:   Good  Insight:    Good  Judgment:   Good  Impulse Control:  Good   Risk Assessment: Danger to Self:  No Self-injurious Behavior: No Danger to Others: No Duty to Warn:no Physical Aggression / Violence:No  Access to Firearms a concern: No  Gang Involvement:No   Subjective:   Patient in session today reporting anxiety and some improved communication at home. Realizing she is needing a more regular sleep schedule and adds that she is making some changes this week. Family 90th birthday party for grandfather went better than expected "even with multiple people voicing their opinions on various subjects at the party." Processed some of her feelings about more family interactions which seemed helpful for patient. Reports "no depression". Considering getting a new cat after the loss of a previous cat. Upcoming job interview and realizing her conflicted feelings about it, which she discussed well in session today. Good recognition on her part that she needs "to stay more in the immediate moments and near future versus overly focusing further out into her future and what she ultimately wants." Worked on  this more in session, in addition to an improved sleep schedule which recognizes needs to be a priority and she talked through some of the specifics on changes she needs to make for better sleep hygiene and her overall health.  Noticeable decrease and self-negating and able to focus more on the present as well as out into the future.  Interventions: Cognitive Behavioral Therapy and Ego-Supportive  Long term goal: Stabilize anxiety level while increasing ability to function on a daily basis, as Evidenced by patient being able to function daily without feeling her anxiety level impedes her functioning level.[name] term goal: Increase understanding of beliefs and messages that produce worry and amxiety, as Evidenced by patient clearly stating her improved understanding of negative/anxious thought patterns and how they affect how they lead to worry and anxiety. Strategies: 1)Help patient develop reality-based, positive cognitive messages. 2)Increase daily use of positive self-talk  3) refrain from assuming worst-case scenarios.  Diagnosis:   ICD-10-CM   1. Generalized anxiety disorder  F41.1      Plan: Patient in today showing good motivation and participation as she worked further on her anxiety, self-care strategies, and improved communication at home and in other relationships.  As noted above, is paying more attention to some significant self-care needs especially in the area of having more regular sleep patterns, and in dealing with self negating thoughts which she is decreasing.  Patient is making progress and needs to continue working with goal-directed behaviors in order to keep moving forward in a  more hopeful direction. Encouraged patient in her practice of positive/healing/self affirming behaviors as noted in sessions including: Stay in the present focusing on what she can change or control, remain in contact with supportive people, recognize and emphasize her strengths, saying no when  she needs to say no, looking for more positives versus negatives and everyday life and in relationships, refrain from assuming worst-case scenarios, use of more positive self talk, challenge and counteract her self-doubt, refrain from making assumptions about what others might be thinking and instead ask them, continue working on perfectionistic and control issues, and recognize the strengths she shows working with goal-directed behaviors to move in a direction that supports her overall improved emotional health and outlook.   Self rated scales: 1-10 depression scale- 0 1-10 anxiety scale- 6 1-10 hopefulness scale-7 1-10 scale of how she is feeling within family- 6 (with 1 being little difficulty)   Goal review and progress/challenges noted with patient.  Next appointment within 2 weeks.   [name], LCSW

## 2023-01-15 ENCOUNTER — Ambulatory Visit: Payer: 59 | Admitting: Psychiatry

## 2023-01-15 DIAGNOSIS — F411 Generalized anxiety disorder: Secondary | ICD-10-CM | POA: Diagnosis not present

## 2023-01-15 NOTE — Progress Notes (Signed)
[facility] Counselor/Therapist Progress Note  Patient ID: Vanessa Doyle, MRN: [id number],    Date: 01/15/2023  Time Spent: 55 minutes   Treatment Type: Individual Therapy  Reported Symptoms:  anxiety, some challenges with motivation and feels her schedule and "no routine" is impacting her motivation, didn't get chance to interview for job she discussed last session "as they found someone with more experience", communication at home is challenging at times  Mental Status Exam:  Appearance:   Casual     Behavior:  Appropriate, Sharing, and Motivated  Motor:  Normal  Speech/Language:   Clear and Coherent  Affect:  anxious  Mood:  anxious  Thought process:  goal directed  Thought content:    Obsessive thoughts but "managing ok at this point:  Sensory/Perceptual disturbances:    WNL  Orientation:  oriented to person, place, time/date, situation, day of week, month of year, year, and stated date of January 15, 2023  Attention:  Good  Concentration:  Fair  Memory:  WNL  Fund of knowledge:   Good  Insight:    Good  Judgment:   Good  Impulse Control:  Good   Risk Assessment: Danger to Self:  No Self-injurious Behavior: No Danger to Others: No Duty to Warn:no Physical Aggression / Violence:No  Access to Firearms a concern: No  Gang Involvement:No   Subjective:   Patient in today and reporting anxiety, some motivational issues impacted by her schedule and lack of routine currently, and trying to help improve family communication at home and reports that has improved some already. Difficulty getting on a more regular sleep schedule, and we discussed this some in session today, including specific strategies to try and get healthier sleep patterns started. Trying to be more present in the moment versus in the past or future. Realizing her tendency to focus sometimes too far into the future and trying to be more aware of life in the present. Shared and processed her job search-related   obstacles and anxieties, as well as how family opinions are being "felt and interpreted" as patient worked with several personal/family stressors in session today that tend to be brought up by other family members. Sleep overall "is better" and is still working on this. Less self-negating and trying to remain in the present and make healthy decisions.  Interventions: Cognitive Behavioral Therapy and Ego-Supportive  Long term goal: Stabilize anxiety level while increasing ability to function on a daily basis, as Evidenced by patient being able to function daily without feeling her anxiety level impedes her functioning level.[name] term goal: Increase understanding of beliefs and messages that produce worry and amxiety, as Evidenced by patient clearly stating her improved understanding of negative/anxious thought patterns and how they affect how they lead to worry and anxiety. Strategies: 1)Help patient develop reality-based, positive cognitive messages. 2)Increase daily use of positive self-talk  3) refrain from assuming worst-case scenarios  Diagnosis:   ICD-10-CM   1. Generalized anxiety disorder  F41.1      Plan:   Patient in today showing good participation and motivation as she continued her work on anxiety, improve communication and relationships and especially at home with family, and self-care strategies.  She is continuing to do so progress with her goals and needs to continue with goal-directed behaviors in order to keep moving in a forward and hopeful direction. Encouraged patient in her practice of positive/self affirming/healing behaviors as discussed in session including: Staying in contact with supportive people, recognize  and emphasize her strengths, saying no when she needs to say no, looking for more positives versus negatives in daily life and relationships, remain in the present focused on what she can change or control, avoid assuming worst-case scenarios, positive self talk,  challenge and counteract her self-doubt, refrain from making assumptions about what others might be thinking and instead asked them, continue working on perfectionistic and control issues, and realize the strength she shows working with goal-directed behaviors to move in a direction that supports her overall improved emotional health and wellbeing.  Self rating scales: (updated 01-15-2023) 1-10 depression scale-4 1-10 anxiety scale- 7 1-10 hopefulness scale-7 1-10 scale of how she is feeling within family- 6 (with 1 being little difficulty)   Goal review and progress/challenges noted with patient.  Next appointment within 2 weeks.   [name], LCSW

## 2023-01-21 ENCOUNTER — Ambulatory Visit: Payer: 59 | Admitting: Psychiatry

## 2023-01-21 DIAGNOSIS — F411 Generalized anxiety disorder: Secondary | ICD-10-CM | POA: Diagnosis not present

## 2023-01-21 NOTE — Progress Notes (Signed)
[facility] Counselor/Therapist Progress Note  Patient ID: Vanessa Doyle, MRN: [id number],    Date: 01/21/2023  Time Spent: 50 minutes   Treatment Type: Individual Therapy  Reported Symptoms:  anxiety, some challenges with motivation "but is improving", communication issues at home "improving some"  Mental Status Exam:  Appearance:   Casual     Behavior:  Appropriate and Sharing  Motor:  Normal  Speech/Language:   Clear and Coherent  Affect:  anxious  Mood:  anxious  Thought process:  goal directed  Thought content:    Some obsessive thoughts  Sensory/Perceptual disturbances:    WNL  Orientation:  oriented to person, place, time/date, situation, day of week, month of year, year, and stated date of June12, 2024  Attention:  Good  Concentration:  Good  Memory:  WNL  Fund of knowledge:   Good  Insight:    Good and Fair  Judgment:   Good  Impulse Control:  Good   Risk Assessment: Danger to Self:  No Self-injurious Behavior: No Danger to Others: No Duty to Warn:no Physical Aggression / Violence:No  Access to Firearms a concern: No  Gang Involvement:No   Subjective:  Patient and for session today and reporting anxiety, with some continued challenges with motivation and her communication at home but both are improving. States "I have noticed I get a little better more recently with each session we have" including "I don't cry as much, my anxiety doesn't feel as constant so much of the time, some decrease in procrastination, some gradual improvement in self-esteem, more self-accepting, less self-critical. Discussed her need for a more regular sleep schedule with more emphasis today (on strategies and motivation) as she's found it difficult to stick with this goal. Working on some job applications and "re-vamping my resume and application." Less self-negating, some gradual improvement in motivation, being more responsible." Continues to work on being more present in the moment  rather than going back to the past or jumping into the future. 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   Interventions: Cognitive Behavioral Therapy and Ego-Supportive  Long term goal: Stabilize anxiety level while increasing ability to function on a daily basis, as Evidenced by patient being able to function daily without feeling her anxiety level impedes her functioning level.Vanessa Doyle term goal: Increase understanding of beliefs and messages that produce worry and amxiety, as Evidenced by patient clearly stating her improved understanding of negative/anxious thought patterns and how they affect how they lead to worry and anxiety. Strategies: 1)Help patient develop reality-based, positive cognitive messages. 2)Increase daily use of positive self-talk  3) refrain from assuming worst-case scenarios  Diagnosis:   ICD-10-CM   1. Generalized anxiety disorder  F41.1      Plan:   Patient in today actively participating and showing some improved motivation and continuing to work on her anxiety, motivation, communication especially within family relationships. Is definitely making progress and needs to continue working with goal-directed behaviors to move in a forward direction. Encouraged patient in practicing more positive and self affirming behaviors as noted in session including: Remain in contact with supportive people, look for more positives versus negatives daily, recognize and emphasize her strengths versus what she sees as her limitations, saying no when she needs to say no, avoid assuming worst-case scenarios, positive self talk, challenge and counteract her self-doubt, stay in the present focused on what she can change, refrain from making assumptions about what others might be thinking and  instead ask them, continue working on perfectionistic and control issues, and recognize the strength she shows working with goal-directed behaviors to move in a direction that  supports her overall improved emotional health and outlook.  Self rating scales: (updated 01-21-2023) 1-10 depression scale-3/4 1-10 anxiety scale- 7 1-10 hopefulness scale-7 1-10 scale of how she is feeling within family- 5 (with 1 being little difficulty)  Goal review and progress/challenges noted with patient.  Next appointment within 2 weeks.   Vanessa Fare, LCSW

## 2023-01-27 ENCOUNTER — Ambulatory Visit: Payer: 59 | Admitting: Psychiatry

## 2023-01-27 DIAGNOSIS — F411 Generalized anxiety disorder: Secondary | ICD-10-CM | POA: Diagnosis not present

## 2023-01-27 NOTE — Progress Notes (Signed)
[facility] Counselor/Therapist Progress Note  Patient ID: Vanessa Doyle, MRN: [id number],    Date: 01/27/2023  Time Spent: 55 minutes   Treatment Type: Individual Therapy  Reported Symptoms: anxiety, some decrease in obsessive thoughts  Mental Status Exam:  Appearance:   Casual     Behavior:  Appropriate, Sharing, and Motivated  Motor:  Normal  Speech/Language:   Clear and Coherent  Affect:  anxious  Mood:  anxious  Thought process:  goal directed  Thought content:    Obsessive thoughts "but some improvement"  Sensory/Perceptual disturbances:    WNL  Orientation:  oriented to person, place, time/date, situation, day of week, month of year, year, and stated date of January 27, 2023  Attention:  Good  Concentration:  Good  Memory:  WNL  Fund of knowledge:   Good  Insight:    Good  Judgment:   Good  Impulse Control:  Good   Risk Assessment: Danger to Self:  No Self-injurious Behavior: No Danger to Others: No Duty to Warn:no Physical Aggression / Violence:No  Access to Firearms a concern: No  Gang Involvement:No   Subjective:  Patient in today and reporting anxiety as main symptom. Less obsessiveness noted.Trying to go to bed earlier to enable improved sleep and looks more rested today. More goal-focused recently, sharing several examples of working on changes and being more motivaed and self-directed.Updated her resume. Shared how she struggles with living in her parent's home not feeling she is as freed to do things. Focusing heavily on her job search and has accomplish updating several . Working further on improving her self-talk to be more positive, and less judgement. Continues work on Lawyer. Less self-judging. Decision-making still difficult at times but is showing more confidence and effort, and at times recognizing some positive results.  Trying not to overthink.  Overall is less self-critical and needing to continue working on this to make it more  consistent.  Less procrastination.  Did complete changing up her work resume and cover letter as she is continuing to look for employment in her field.  Less focusing on the past.  Showing more internal strength and belief in herself, with this being a work in progress.  Interventions: Cognitive Behavioral Therapy and Ego-Supportive  Long term goal: Stabilize anxiety level while increasing ability to function on a daily basis, as Evidenced by patient being able to function daily without feeling her anxiety level impedes her functioning level.[name] term goal: Increase understanding of beliefs and messages that produce worry and amxiety, as Evidenced by patient clearly stating her improved understanding of negative/anxious thought patterns and how they affect how they lead to worry and anxiety. Strategies: 1)Help patient develop reality-based, positive cognitive messages. 2)Increase daily use of positive self-talk  3) refrain from assuming worst-case scenarios  Diagnosis:   ICD-10-CM   1. Generalized anxiety disorder  F41.1      Plan:   Patient today showing good participation in session as she continued work on her anxiety, motivational challenges, and family relationships especially around the issue of communication.  Motivation improving although still facing some challenges.  Some strengthening of family relationships as they continue to work on interpersonal issues in a healthier manner.  Patient needs to continue working with goal-directed behaviors in order to keep moving in a forward direction. Encouraged patient in her practice of more positive and self affirming behaviors as noted in session including: Look for more positives versus negatives daily, recognize and emphasize  her own strengths versus what she sees as her limitations, stay in contact with supportive people, saying no when she needs to say no, avoid assuming worst-case scenarios refrain from "putting off" things she needs to  do, positive self talk, challenge and counteract self-doubt, stay in the present focused on what she can change, refrain from making assumptions about what others might be thinking and instead asked them, continue work on perfectionistic and control issues, and realize the strength she shows working with goal-directed behaviors to move in a direction that supports her overall improved emotional health and wellbeing.  Self rating scales: (updated 01-27-2023) 1-10 depression scale-3/4 1-10 anxiety scale- 6/7 1-10 hopefulness scale-7/8 1-10 scale of how she is feeling within family- 5/6 (with 1 being little difficulty)  Goal review and progress/challenges noted with patient.  Next appointment within 2 weeks.   [name], LCSW

## 2023-02-04 ENCOUNTER — Ambulatory Visit (INDEPENDENT_AMBULATORY_CARE_PROVIDER_SITE_OTHER): Payer: 59 | Admitting: Psychiatry

## 2023-02-04 DIAGNOSIS — F411 Generalized anxiety disorder: Secondary | ICD-10-CM | POA: Diagnosis not present

## 2023-02-04 NOTE — Progress Notes (Signed)
[facility] Counselor/Therapist Progress Note  Patient ID: Vanessa Doyle, MRN: [id number],    Date: 02/04/2023  Time Spent: 55 minutes   Treatment Type: Individual Therapy  Reported Symptoms: anxiety  Mental Status Exam:  Appearance:   Casual and Neat     Behavior:  Appropriate, Sharing, and Motivated  Motor:  Normal  Speech/Language:   Clear and Coherent  Affect:  anxious  Mood:  anxious  Thought process:  goal directed  Thought content:    Decreased obsessive thoughts as she continues to work on this  Sensory/Perceptual disturbances:    WNL  Orientation:  oriented to person, place, time/date, situation, day of week, month of year, year, and stated date of February 04, 2023  Attention:  Good  Concentration:  Good  Memory:  WNL  Fund of knowledge:   Good  Insight:    Good  Judgment:   Good  Impulse Control:  Good   Risk Assessment: Danger to Self:  No Self-injurious Behavior: No Danger to Others: No Duty to Warn:no Physical Aggression / Violence:No  Access to Firearms a concern: No  Gang Involvement:No   Subjective:  Patient in session today reporting her main symptom is anxiety, improved self-care especially regarding sleep patterns, some obsessiveness but it has decreased, and continue to work on communication within family relationships, anxiety, and recent motivational challenges. 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 Focusing on how her perfectionism has impacted her over the years, and second-guessing herself a lot especially in her trying to get a job and feel more grounded.  This work on perfectionism is significant for this patient.  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 Reports improved self-talk and decrease self-judging although this is a work in progress. Better at not overthinking as much as she continues to work on this also. Sees improvement in less focus on past. Believing in her self more and being able to feel more internal strength for longer periods of tine.   Interventions: Cognitive Behavioral Therapy, Ego-Supportive, and Insight-Oriented  Long term goal: Stabilize anxiety level while increasing ability to function on a daily basis, as Evidenced by patient being able to function daily without feeling her anxiety level impedes her functioning level.[name] term goal: Increase understanding of beliefs and messages that produce worry and amxiety, as Evidenced by patient clearly stating her improved understanding of negative/anxious thought patterns and how they affect how they lead to worry and anxiety. Strategies: 1)Help patient develop reality-based, positive cognitive messages. 2)Increase daily use of positive self-talk  3) refrain from assuming worst-case scenarios  Diagnosis:   ICD-10-CM   1. Generalized anxiety disorder  F41.1      Plan:  Patient in session today actively participating and working on her issues around family relationships and communication, challenges with motivation, and her anxiety.  Is making progress, showing more responsibility and maturity as a young adult, and needs to continue working with goal-directed behaviors to keep moving in a forward direction.Encouraged patient in practicing more positive and self affirming behaviors as noted in session including: Looking for more positives versus negatives daily, recognize and emphasize her own strengths versus what she sees  as her limitations, stay in contact with supportive people, saying no when she needs to say no, avoid assuming worst-case scenarios, refrain from putting off things she needs to do, use of  positive self talk, challenge and counteract self-doubt, stay in the present focused on what she can change, refrain from making assumptions about what others might be thinking and instead ask them, continue work on perfectionistic and control issues, and recognize the strengths she shows working with goal-directed behaviors to move in a direction that supports her overall improved emotional health and outlook.  Self rating scales: (updated 02-04-2023) 1-10 depression scale-3 1-10 anxiety scale- 6/7 1-10 hopefulness scale-7 1-10 scale of how she is feeling within family- 6 (with 1 being little difficulty)   Goal review and progress/challenges noted with patient.  Next appointment within 2 weeks.   [name], LCSW

## 2023-03-19 ENCOUNTER — Ambulatory Visit: Payer: 59 | Admitting: Psychiatry

## 2023-03-19 DIAGNOSIS — F411 Generalized anxiety disorder: Secondary | ICD-10-CM | POA: Diagnosis not present

## 2023-03-19 NOTE — Progress Notes (Signed)
[facility] Counselor/Therapist Progress Note  Patient ID: Vanessa Doyle, MRN: [id number],    Date: 03/19/2023  Time Spent: 53 minutes  Treatment Type: Individual Therapy  Reported Symptoms: anxiety, depression, dealing with uncertainty  Mental Status Exam:  Appearance:   Casual     Behavior:  Appropriate, Sharing, and Motivated  Motor:  Normal  Speech/Language:   Clear and Coherent  Affect:  Depressed and anxious  Mood:  anxious  Thought process:  goal directed  Thought content:    Some obsessive thoughts  Sensory/Perceptual disturbances:    WNL  Orientation:  oriented to person, place, time/date, situation, day of week, month of year, year, and stated date of Aug. 8, 2024  Attention:  Good  Concentration:  Good and Fair  Memory:  WNL  Fund of knowledge:   Good  Insight:    Good and Fair  Judgment:   Good and Fair  Impulse Control:  Good   Risk Assessment: Danger to Self:  No Self-injurious Behavior: No Danger to Others: No Duty to Warn:no Physical Aggression / Violence:No  Access to Firearms a concern: No  Gang Involvement:No   Subjective: Patient in session today reporting anxiety, depression, stressed, and dealing with uncertainly mostly related to personal, family, and work issues/relationships and family death. Grandmother died a couple weeks ago and patient needed to process her feelings surrounding this loss and her concerns for her surviving grandfather age 15 who has now been hospitalized for several days. Sad and talking through her sadness in healthy ways today and seems to be managing her grief well.  Also needed some time today to focus and talk through her anxiety in other areas of life, her obsessiveness, and communication and family relationships and work relationships including as she searches for a full-time job.  Does continue to show increased focus on her goals which is a real positive for her.  Self-talk continues to improve even with  significantly increased stressors more recently.  Concentrating more on trying not to overthink as this definitely has been tripping her up at times emotionally.  Working to believe more in herself and showing increased internal strength more often.  Motivation improved some today.  Interventions: Cognitive Behavioral Therapy and Ego-Supportive  Long term goal: Stabilize anxiety level while increasing ability to function on a daily basis, as Evidenced by patient being able to function daily without feeling her anxiety level impedes her functioning level.[name] term goal: Increase understanding of beliefs and messages that produce worry and amxiety, as Evidenced by patient clearly stating her improved understanding of negative/anxious thought patterns and how they affect how they lead to worry and anxiety. Strategies: 1)Help patient develop reality-based, positive cognitive messages. 2)Increase daily use of positive self-talk  3) refrain from assuming worst-case scenarios  Diagnosis:   ICD-10-CM   1. Generalized anxiety disorder  F41.1      Plan: Patient in today motivated, anxious, depressed, and feeling stressed as she works on personal and family issues contributing to her symptoms, in addition to an unexpected family death and some other relationship concerns.  She has definitely made progress and working on her goals and needs to continue working with goal-directed behaviors to keep moving in a forward direction. Reminded and encouraged patient in her practice of more positive and self affirming behaviors as noted in session including: Recognizing emphasize her strengths versus what she sees as her limitations, looking for more positives versus negatives daily, remain in contact with supportive  people, avoid assuming worst-case scenarios, refrain from putting all things she needs to do, positive self talk, challenge and counteract self-doubt, refrain from making assumptions about what others  might be thinking and instead asked them, stay on the present focusing on what she can change, continue her work on perfectionistic and control issues, and realize the strength she shows working with goal-directed behaviors to move in a direction that supports her overall improved emotional health and wellbeing.  Goal review and progress/challenges noted with patient.  Next appointment within 2 to 3 weeks.   [name], LCSW

## 2023-03-25 ENCOUNTER — Ambulatory Visit: Payer: 59 | Admitting: Psychiatry

## 2023-03-25 DIAGNOSIS — F411 Generalized anxiety disorder: Secondary | ICD-10-CM | POA: Diagnosis not present

## 2023-03-25 NOTE — Progress Notes (Signed)
[facility] Counselor/Therapist Progress Note  Patient ID: Vanessa Doyle, MRN: [id number],    Date: 03/25/2023  Time Spent: 53 minutes   Treatment Type: Individual Therapy  Reported Symptoms:  anxiety, depression, difficulty dealing with uncertainty  Mental Status Exam:  Appearance:   Casual     Behavior:  Appropriate, Sharing, and Motivated  Motor:  Normal  Speech/Language:   Clear and Coherent  Affect:  Anxious, depressed  Mood:  anxious and depressed  Thought process:  goal directed  Thought content:    Some obsessive thoughts but decreased  Sensory/Perceptual disturbances:    WNL  Orientation:  oriented to person, place, time/date, situation, day of week, month of year, year, and stated date of March 25, 2023  Attention:  Good  Concentration:  Good  Memory:  WNL  Fund of knowledge:   Good  Insight:    Good  Judgment:   Good  Impulse Control:  Good and Fair   Risk Assessment: Danger to Self:  No Self-injurious Behavior: No Danger to Others: No Duty to Warn:no Physical Aggression / Violence:No  Access to Firearms a concern: No  Gang Involvement:No   Subjective:  Patient in for session today and reporting anxiety, depression, and difficulty dealing with uncertainty and mostly related to not having a "real job" yet after graduating from college, family stress and is exhausting emotionally, family concerns with grandfather hospitalized for extended period of time. Recent death of grandmother and patient still "working through her grief" and is making progress. Working a part-time job for now while she searches for full-time employment. Over-dwelling on her lack of sleep may be making that situation worse and we discussed her being aware but not overly focusing and talking about her lack of sleep as it seems to be counter-productive. Focused more specifically on her self-care and more positive self-talk needed, but also working to not overly emphasize her lack of sleep  as that worrying cycle makes it more difficult to get good sleep.  Does have some medication to help her sleep and when she is not home alone, she feels comfortable taking it. Talked through the plan to request a day off at the end of the week that she feels would be a part of positive self-care at this point.  Continues to try and work with certain thoughts and believes that do produce more worry and anxiety for her and trying to interrupt them.  Working to not overthink everything and also to have more positive self talk.  Interventions: Cognitive Behavioral Therapy and Ego-Supportive  Long term goal: Stabilize anxiety level while increasing ability to function on a daily basis, as Evidenced by patient being able to function daily without feeling her anxiety level impedes her functioning level.[name] term goal: Increase understanding of beliefs and messages that produce worry and amxiety, as Evidenced by patient clearly stating her improved understanding of negative/anxious thought patterns and how they affect how they lead to worry and anxiety. Strategies: 1)Help patient develop reality-based, positive cognitive messages. 2)Increase daily use of positive self-talk  3) refrain from assuming worst-case scenarios  Diagnosis:   ICD-10-CM   1. Generalized anxiety disorder  F41.1      Plan:   Review and check: Patient today actively participating in session and continues to work on better managing her anxiety and depression related to stressors at home and within the larger family group, self-care including getting enough sleep, and unexpected family death recently, and job hunting issues.  She does show progress and needs to continue working with her goal-directed behaviors to move in a positive direction. Reminded and encouraged patient in her practice of more positive and self affirming behaviors as noted in session including: Recognizing and emphasizing her strengths versus what she sees as  her limitations, look for more positives versus negatives daily, stay in contact with supportive people, avoid assuming worst-case scenarios, refrain from putting off things that she needs to do, positive self talk, challenge and counteract self-doubt, refrain from making assumptions about what others might be thinking and instead ask them, and recognize the strength she shows working with goal-directed behaviors to move in a direction that supports her overall improved emotional health and outlook.  Goal review and progress/challenges noted with patient.  Next appointment within 2 weeks.  [name], LCSW

## 2023-04-01 ENCOUNTER — Ambulatory Visit: Payer: 59 | Admitting: Psychiatry

## 2023-04-07 ENCOUNTER — Ambulatory Visit: Payer: 59 | Admitting: Psychiatry

## 2023-04-07 DIAGNOSIS — F411 Generalized anxiety disorder: Secondary | ICD-10-CM | POA: Diagnosis not present

## 2023-04-07 NOTE — Progress Notes (Signed)
[facility] Counselor/Therapist Progress Note  Patient ID: Vanessa Doyle, MRN: [id number],    Date: 04/07/2023  Time Spent: 53 minutes   Treatment Type: Individual Therapy  Reported Symptoms: anxiety, depression  Mental Status Exam:  Appearance:   Casual     Behavior:  Appropriate, Sharing, and Motivated  Motor:  Normal  Speech/Language:   Clear and Coherent  Affect:  Depressed and anxious  Mood:  anxious and depressed  Thought process:  goal directed  Thought content:    Obsessive thoughts "but improving  Sensory/Perceptual disturbances:    WNL  Orientation:  oriented to person, place, time/date, situation, day of week, month of year, year, and stated date of Aug. 27, 2024  Attention:  Good  Concentration:  Fair  Memory:  WNL  Fund of knowledge:   Good  Insight:    Good and Fair  Judgment:   Good and Fair  Impulse Control:  Good   Risk Assessment: Danger to Self:  No Self-injurious Behavior: No Danger to Others: No Duty to Warn:no Physical Aggression / Violence:No  Access to Firearms a concern: No  Gang Involvement:No   Subjective: Patient in session today reporting anxiety, some depression, family stress, difficulty managing uncertainty and not having found a job after completing college.  Lots of stress for patient and within the family re: ongoing illness of grandfather, especially after recent death of his wife. "Very consistent" in trying to get better sleep often using "reading right before bed as that has helped in the past. Continues part-time work as she looks for full-time work. Trying to focus more on healthier self-care, and noticing that "I need to set better boundaries" and talked about this more today especially what boundaries she is aware are needed. Also working on improved communication, and follow-through related to communication and task completion. Self-care challenges. Working more on her overthinking and being able to set limits. Review of more  positive/understanding self-talk especially in situations where she describes feeling very frustrated or not understood by others (family). Has plans to visit friend (since middle school) in [place] today.   Interventions: Cognitive Behavioral Therapy and Ego-Supportive  Long term goal: Stabilize anxiety level while increasing ability to function on a daily basis, as Evidenced by patient being able to function daily without feeling her anxiety level impedes her functioning level.[name] term goal: Increase understanding of beliefs and messages that produce worry and amxiety, as Evidenced by patient clearly stating her improved understanding of negative/anxious thought patterns and how they affect how they lead to worry and anxiety. Strategies: 1)Help patient develop reality-based, positive cognitive messages. 2)Increase daily use of positive self-talk  3) refrain from assuming worst-case scenarios  Diagnosis:   ICD-10-CM   1. Generalized anxiety disorder  F41.1      Plan: Patient in today participating well in session and focusing more on her management of anxiety and depression, most of which are related to family stressors at home and the extended family.  She reports continued efforts in getting more adequate sleep and pursuing her job finding to get a position in her field.  Patient is working to gain more progress and she needs to continue working with her goal-directed behaviors to move in a forward direction. Reminded and encouraged patient to be practicing more positive and self affirming behaviors between sessions including: Recognizing and emphasizing her strengths versus what she sees as her weaknesses/limitations, looking for more positives versus negatives daily, stay in contact with supportive  people, refrain from assuming worst-case scenarios, practice not putting things off that she needs to accomplish, positive self talk, challenge and counteract self-doubt, avoid making  assumptions about what other people may be thinking and instead asked them, and recognize the strengths she shows working with goal-directed behaviors to move in a direction that supports her overall improved emotional health and wellbeing.  Goal review and progress/challenges noted with patient.  Next appointment within 2 weeks.   [name], LCSW

## 2023-04-23 ENCOUNTER — Ambulatory Visit: Payer: 59 | Admitting: Psychiatry

## 2023-04-23 DIAGNOSIS — F411 Generalized anxiety disorder: Secondary | ICD-10-CM

## 2023-04-23 NOTE — Progress Notes (Signed)
[facility] Counselor/Therapist Progress Note  Patient ID: JANUITA NISH, MRN: [id number],    Date: 04/23/2023  Time Spent: 55 minutes   Treatment Type: Individual Therapy  Reported Symptoms: anxiety, irritability, depression ""   Mental Status Exam:  Appearance:   Casual     Behavior:  Appropriate, Sharing, and Motivated  Motor:  Normal  Speech/Language:   Clear and Coherent  Affect:  anxiety  Mood:  anxious  Thought process:  goal directed  Thought content:    Obsessive thoughts "but improving"  Sensory/Perceptual disturbances:    WNL  Orientation:  oriented to person, place, time/date, situation, day of week, month of year, year, and stated date of Sept. 12, 2024  Attention:  Good , Later adds "some difficulty at times with attention at work"  Concentration:  Good  Memory:  WNL  Fund of knowledge:   Good  Insight:    Good and Fair  Judgment:   Good  Impulse Control:  Good   Risk Assessment: Danger to Self:  No Self-injurious Behavior: No Danger to Others: No Duty to Warn:no Physical Aggression / Violence:No  Access to Firearms a concern: No  Gang Involvement:No    Subjective: Patient today in session and reporting anxiety some decreased depression, family stressors, and reports she is managing stressors better in some situation. Grandfather still living "although ups and downs" and patient reports he still requires 24 hr help and this is stressful. Concerned about mom and the toll it takes on her. Grandmother's memorial service is this weekend and patient stressed and patient feeling more at peace with grandmother's death. Impacted by other stressful relationships within family, which patient stated she needed to process more today as she was feeling increased anxiety related to the stress within family. (Not all details included in note due to patient privacy needs.) Worked on more boundary issues and not getting easily pulled into family "drama". Tearful and  speaks about family issues and realizing more her need for more clear boundaries. Sleep has actually improved some. Self-care improving and yoga is helping. "Still overthinking but trying to interrupt it more often and is having some success with that."   Interventions: Cognitive Behavioral Therapy and Ego-Supportive  Long term goal: Stabilize anxiety level while increasing ability to function on a daily basis, as Evidenced by patient being able to function daily without feeling her anxiety level impedes her functioning level.[name] term goal: Increase understanding of beliefs and messages that produce worry and amxiety, as Evidenced by patient clearly stating her improved understanding of negative/anxious thought patterns and how they affect how they lead to worry and anxiety. Strategies: 1)Help patient develop reality-based, positive cognitive messages. 2)Increase daily use of positive self-talk  3) refrain from assuming worst-case scenarios  Diagnosis:   ICD-10-CM   1. Generalized anxiety disorder  F41.1      Plan: Patient today focusing further on management of her anxiety and some depression although depression has decreased some.  Showing more strength even though she is struggling, and is becoming more aware of the importance of setting limits and boundaries with others including her family.  Has made some progress in getting more adequate sleep and needs to keep working on this.Patient has made progress and needs to continue working with goal-directed behaviors to keep moving in a positive direction. Reminded and encouraged patient to be practicing more positive and self affirming behaviors between sessions including: Recognizing and emphasizing her strengths versus over focusing on her  perceived weaknesses, look for more positives versus negatives daily, stay in contact with supportive people, refrain from assuming worst-case scenarios, practice not putting things off that she needs to  accomplish, positive self talk, challenge and counteract self-doubt, avoid making assumptions about what other people may be thinking and instead ask them, and realize the strength she shows working with goal-directed behaviors to move in a direction that supports her overall improved emotional health and outlook into the future.  Goal review and progress/challenges noted with patient.  Next appointment within 2 weeks.   [name], LCSW

## 2023-04-30 ENCOUNTER — Ambulatory Visit (INDEPENDENT_AMBULATORY_CARE_PROVIDER_SITE_OTHER): Payer: 59 | Admitting: Psychiatry

## 2023-04-30 DIAGNOSIS — F411 Generalized anxiety disorder: Secondary | ICD-10-CM | POA: Diagnosis not present

## 2023-04-30 NOTE — Progress Notes (Signed)
[facility] Counselor/Therapist Progress Note  Patient ID: Vanessa Doyle, MRN: [id number],    Date: 04/30/2023  Time Spent: 50 minutes   Treatment Type: Individual Therapy  Reported Symptoms: anxiety, depression, irritability   Mental Status Exam:  Appearance:   Casual     Behavior:  Appropriate, Sharing, and Motivated  Motor:  Normal  Speech/Language:   Clear and Coherent  Affect:  Depressed and anxious, irritable  Mood:  anxious, depressed, and irritable  Thought process:  goal directed  Thought content:    Obsessive thoughts decreasing some  Sensory/Perceptual disturbances:    WNL  Orientation:  oriented to person, place, time/date, situation, day of week, month of year, year, and stated date of Sept. 19, 2024  Attention:  Fair  Concentration:  Fair  Memory:  WNL  Fund of knowledge:   Good  Insight:    Good and Fair  Judgment:   Good  Impulse Control:  Good   Risk Assessment: Danger to Self:  No Self-injurious Behavior: No Danger to Others: No Duty to Warn:no Physical Aggression / Violence:No  Access to Firearms a concern: No  Gang Involvement:No   Subjective:  Patient in session today reporting anxiety and her depressed continues to decrease some. Family stressors are decreasing some. Working hours have been increased recently. Reports some improvement in her management of stress and "I'm not as emotionally affected by things I can't control". Grandfather still living and "is as stabilized as possible." Reports memorial service for grandmother went well. Still working on family and boundary issues, not wanting to be involved in "family drama". Trying to have better self-care (in several areas), practicing yoga, and trying to decrease her overthinking. Worked more on her anxiety/depression/and irritability especially how they emerge in relationships and under stress.  Interventions: Cognitive Behavioral Therapy and Ego-Supportive  Long term goal: Stabilize  anxiety level while increasing ability to function on a daily basis, as Evidenced by patient being able to function daily without feeling her anxiety level impedes her functioning level.[name] term goal: Increase understanding of beliefs and messages that produce worry and amxiety, as Evidenced by patient clearly stating her improved understanding of negative/anxious thought patterns and how they affect how they lead to worry and anxiety. Strategies: 1)Help patient develop reality-based, positive cognitive messages. 2)Increase daily use of positive self-talk  3) refrain from assuming worst-case scenarios  Diagnosis:   ICD-10-CM   1. Generalized anxiety disorder  F41.1      Plan: Patient in session today continuing to work on her anxiety and depression, and depression continues to show some decrease.  Patient not only has become more aware of the need to set limits and boundaries, but is beginning to practice it and is working to become more consistent with it.  She continues to work on getting better sleep more regularly and this is a work in progress. Patient has definitely made progress and she needs to continue working with her goal-directed behaviors to keep moving in a forward direction. Reminded and encouraged patient in her practice of more positive and self affirming behaviors between sessions including: Recognizing and emphasizing her strengths versus overly focusing on her perceived weaknesses, look for more positives versus negatives daily, stay in contact with supportive people, refrain from assuming worst-case scenarios, practice not putting things off that she needs to accomplish, use of more positive self talk, challenging counteract self-doubt, avoid making assumptions about what other people may be thinking and instead asked them, and  recognize the strengths she shows working with goal-directed behaviors to move in a direction that supports her overall improved emotional health and  wellbeing.  Goal review and progress/challenges noted with patient.  Next appointment within 2 weeks.   [name], LCSW

## 2023-05-07 ENCOUNTER — Ambulatory Visit: Payer: 59 | Admitting: Psychiatry

## 2023-05-07 DIAGNOSIS — F411 Generalized anxiety disorder: Secondary | ICD-10-CM

## 2023-05-07 NOTE — Progress Notes (Signed)
[facility] Counselor/Therapist Progress Note  Patient ID: Vanessa Doyle, MRN: [id number],    Date: 05/07/2023  Time Spent: 50 minutes  Treatment Type: Individual Therapy  Reported Symptoms:  anxiety, some irritability, depression   Mental Status Exam:  Appearance:   Casual     Behavior:  Appropriate and Sharing  Motor:  Normal  Speech/Language:   Normal Rate  Affect:  Anxious, depressed  Mood:  anxious, depressed, and sad  Thought process:  goal directed  Thought content:    Improvement in obsessive thinking "but still an issue"  Sensory/Perceptual disturbances:    WNL  Orientation:  oriented to person, place, time/date, situation, day of week, month of year, year, and stated date of Sept. 26, 2024  Attention:  Good "but sometimes fair"  Concentration:  Good and Fair  Memory:  WNL  Fund of knowledge:   Good  Insight:    Good and Fair  Judgment:   Good  Impulse Control:  Good and Fair   Risk Assessment: Danger to Self:  No Self-injurious Behavior: No Danger to Others: No Duty to Warn:no Physical Aggression / Violence:No  Access to Firearms a concern: No  Gang Involvement:No   Subjective:  Patient in session today reporting anxiety, tearfulness, and depression related to "Life, trying to find employment after having graduated college, part time work while trying to find job in her field, family relationships/issues. Does feel relationships with mom and dad "are going better and some increased mutual understanding". Worked further today on her discouragement on not having a job yet after graduating from college, and was able to vent her concerns and some negative thoughts towards herself, but eventually stopped blaming and looked at more strategies that she can follow through on. Also picked up more today on her goals much needed "positive self-talk", better physical self-care, and working on her thought patterns that reflect more positives in viewing herself versus  getting stuck in the negatives. Is not working today and talked through some specific ways of better caring for herself today and commits to following through on improved self-care and will provide some feedback on this at next session.  Extended "family drama" seems to have lessened some.  Grandfather's condition is "stable" currently but still quite serious.  Interventions: Cognitive Behavioral Therapy and Ego-Supportive  Long term goal: Stabilize anxiety level while increasing ability to function on a daily basis, as Evidenced by patient being able to function daily without feeling her anxiety level impedes her functioning level.[name] term goal: Increase understanding of beliefs and messages that produce worry and amxiety, as Evidenced by patient clearly stating her improved understanding of negative/anxious thought patterns and how they affect how they lead to worry and anxiety. Strategies: 1)Help patient develop reality-based, positive cognitive messages. 2)Increase daily use of positive self-talk  3) refrain from assuming worst-case scenarios  Diagnosis:   ICD-10-CM   1. Generalized anxiety disorder  F41.1      Plan:  Patient in session today focusing more on her anxiety and depression.  Further decrease in her depression.  Increased need and awareness to set limits and boundaries and is already making some progress with these behaviors.  As part of her self-care, she has become more focused on getting better sleep and how that can really impact her mood in a positive way.  She does continue to make progress and needs to keep working with her goal-directed behaviors in order to move in a healthier direction. Encouraged  patient to be practicing more positive and self affirming behaviors between sessions including: Recognize and emphasize her strengths rather than her perceived weaknesses, look more for positives versus negatives each day, stay in contact with supportive people, refrain from  assuming worst-case scenarios, practice not putting things off that she needs to accomplish, use of more positive self talk, challenge and counteract self-doubt, avoid making assumptions about what other people may be thinking and instead ask them, and realize the strength she shows working with goal-directed behaviors to move in a direction that supports her overall improved emotional health and outlook into the future.  Goal review and progress/challenges noted with patient.  Next appointment within 2 weeks.   [name], LCSW

## 2023-05-14 ENCOUNTER — Ambulatory Visit: Payer: 59 | Admitting: Psychiatry

## 2023-05-14 DIAGNOSIS — F411 Generalized anxiety disorder: Secondary | ICD-10-CM

## 2023-05-14 NOTE — Progress Notes (Signed)
[facility] Counselor/Therapist Progress Note  Patient ID: KEMIAH BOOZ, MRN: [id number],    Date: 05/14/2023  Time Spent: 55 minutes   Treatment Type: Individual Therapy  Reported Symptoms: anxiety, irritability, depression   Mental Status Exam:  Appearance:   Casual     Behavior:  Appropriate, Sharing, and Motivated  Motor:  Normal  Speech/Language:   Clear and Coherent  Affect:  Depressed and anxious  Mood:  anxious and depressed, some irritability  Thought process:  goal directed  Thought content:    Obsessive thought some better  Sensory/Perceptual disturbances:    WNL  Orientation:  oriented to person, place, time/date, situation, day of week, month of year, year, and stated date of Oct. 3, 2024  Attention:  Fair  Concentration:  Good  Memory:  WNL  Fund of knowledge:   Good  Insight:    Good and Fair  Judgment:   Good  Impulse Control:  Good   Risk Assessment: Danger to Self:  No Self-injurious Behavior: No Danger to Others: No Duty to Warn:no Physical Aggression / Violence:No  Access to Firearms a concern: No  Gang Involvement:No   Subjective:  Patient today actively participating in session working on her anxiety, depression, some negative self talk, and trying to figure out next steps as she seeks employment after having graduated from college.  Does not have a lot of close support outside family.  Is working part-time and has not found a job in her field yet.  Family relationships and communication improving some as they try to work together better with more mutual understanding and helping family members with significant health issues. 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 Realizing her need to  decrease her self-blaming. Sometimes blames self for trying to do th right things. "Less family drama." Working on better self-care overall and consistent supportive self-talk. States she's been told on current job that she needs to slow down and not stress so much, but feeling her stress level has actually deceased some, although not always consistently. Working to get her stress decreased and discussed some journaling to do as homework between sessions. Showing a little more resilience.  Interventions: Cognitive Behavioral Therapy and Ego-Supportive  Long term goal: Stabilize anxiety level while increasing ability to function on a daily basis, as Evidenced by patient being able to function daily without feeling her anxiety level impedes her functioning level.[name] term goal: Increase understanding of beliefs and messages that produce worry and amxiety, as Evidenced by patient clearly stating her improved understanding of negative/anxious thought patterns and how they affect how they lead to worry and anxiety. Strategies: 1)Help patient develop reality-based, positive cognitive messages. 2)Increase daily use of positive self-talk  3) refrain from assuming worst-case scenarios  Diagnosis:   ICD-10-CM   1. Generalized anxiety disorder  F41.1      Plan: Patient today in session working further on her anxiety, depression, and some negative self talk.  Continues to work to have healthier boundaries which is an important part of her self-care.  She has made progress and needs to continue working with goal-directed behaviors to extend that progress and keep moving in a healthier direction. Reminded and encouraged patient to be practicing more positive and self  affirming behaviors between sessions including: Refrain from assuming worst-case scenarios, practice not putting things off that she needs to accomplish, recognize and emphasize her strengths rather than her perceived weaknesses, look more for  positives versus negatives daily, remain in contact with supportive people, challenge and counteract her self-doubt, avoid making assumptions about what other people may be thinking and instead asked them, and recognize the strengths she shows working with goal-directed behaviors to move in a direction that supports her overall improved emotional health and wellbeing.  Goal review and progress/challenges noted with patient.  Next appointment within 1 to 2 weeks.  [name], LCSW

## 2023-05-21 ENCOUNTER — Ambulatory Visit: Payer: 59 | Admitting: Psychiatry

## 2023-05-21 DIAGNOSIS — F411 Generalized anxiety disorder: Secondary | ICD-10-CM

## 2023-05-21 NOTE — Progress Notes (Signed)
[facility] Counselor/Therapist Progress Note  Patient ID: Vanessa Doyle, MRN: [id number],    Date: 05/21/2023  Time Spent: 53 minutes   Treatment Type: Individual Therapy  Reported Symptoms: anxiety, depression, and some irritability but "some better", some decrease appetite but "not sick   Mental Status Exam:  Appearance:   Casual     Behavior:  Appropriate and Sharing  Motor:  Normal  Speech/Language:   Clear and Coherent  Affect:  Anxious, some depression  Mood:  anxious and depressed  Thought process:  goal directed  Thought content:    Decreased obsessive thoughts  Sensory/Perceptual disturbances:    WNL  Orientation:  oriented to person, place, time/date, situation, day of week, month of year, year, and stated date of Oct. 10, 2024  Attention:  Fair  Concentration:  Fair  Memory:  WNL  Fund of knowledge:   Good  Insight:    Fair  Judgment:   Good  Impulse Control:  Good   Risk Assessment: Danger to Self:  No Self-injurious Behavior: No Danger to Others: No Duty to Warn:no Physical Aggression / Violence:No  Access to Firearms a concern: No  Gang Involvement:No   Subjective:   Patient in session today participating well as she focused more on her anxiety, negative self talk, and depression.Still some challenges with motivation and she feels that is related to being more out of a routine recently, and is thinking/working on getting back into a helpful routine. No changes in her search for job. Obsessive thoughts not so strong. "Frustrated and irritated at getting 3 recent rejected job applications." Processing this in session today including her disappointment, trying to stay away from negative assumptions, and set limits with others whom she finds hurtful or unhelpful. Is working to get her energy up and focus on more positive self-talk, and participating in activities that have been helpful and supportive to her in the past.  She does have support within the  family however struggles at times and family relationships to the point it does not always feel supportive.  Hard to receive "rejections" when she is applied for jobs.  Does report "less drama within the family".  And feels that overall she is trying to provide herself better self-care and improved self talk.  Encouraged to continue using her journaling in between sessions and then bring into share as she did today.  Interventions: Cognitive Behavioral Therapy and Ego-Supportive  Long term goal: Stabilize anxiety level while increasing ability to function on a daily basis, as Evidenced by patient being able to function daily without feeling her anxiety level impedes her functioning level.[name] term goal: Increase understanding of beliefs and messages that produce worry and amxiety, as Evidenced by patient clearly stating her improved understanding of negative/anxious thought patterns and how they affect how they lead to worry and anxiety. Strategies: 1)Help patient develop reality-based, positive cognitive messages. 2)Increase daily use of positive self-talk  3) refrain from assuming worst-case scenarios    Diagnosis:   ICD-10-CM   1. Generalized anxiety disorder  F41.1      Plan:  Patient working more on her anxiety, depression, and some self negating in session today.  Having healthier boundaries has been a priority as part of her own self-care and setting limits with others.  She has made progress and needs to continue working with goal-directed behaviors to keep moving and a more positive and healthier direction. Reminded and encouraged patient to be practicing more positive and  self affirming behaviors between sessions including: Refrain from assuming worst-case scenarios, practice not putting things off that she needs to accomplish, recognize and emphasize her strengths rather than her perceived weaknesses, look for more positives versus negatives daily, remain in contact with supportive  people, challenging counteract her self-doubt, avoid making assumptions about what other people may be thinking and instead ask them, and realize the strength she shows working with goal-directed behaviors to move in a direction that supports her overall improved emotional health and wellbeing.  Goal review and progress/challenges noted with patient.  Next appointment within 2 weeks.   [name], LCSW

## 2023-05-28 ENCOUNTER — Ambulatory Visit: Payer: 59 | Admitting: Psychiatry

## 2023-05-28 DIAGNOSIS — F411 Generalized anxiety disorder: Secondary | ICD-10-CM | POA: Diagnosis not present

## 2023-05-28 NOTE — Progress Notes (Signed)
[facility] Counselor/Therapist Progress Note  Patient ID: Vanessa Doyle, MRN: [id number],    Date: 05/28/2023  Time Spent: 53 minutes   Treatment Type: Individual Therapy  Reported Symptoms: anxiety, depression, irritability, some decreased appetite that she feels is related to stress particularly at her current job   Mental Status Exam:  Appearance:   Casual     Behavior:  Appropriate, Sharing, and Motivated  Motor:  Normal  Speech/Language:   Clear and Coherent  Affect:  Anxious, some depression  Mood:  depressed, sad, and some irritability  Thought process:  goal directed  Thought content:    WNL  Sensory/Perceptual disturbances:    WNL  Orientation:  oriented to person, place, time/date, situation, day of week, month of year, year, and stated date of Oct. 17, 2024  Attention:  Good  Concentration:  Good and Fair  Memory:  WNL  Fund of knowledge:   Good  Insight:    Good and Fair  Judgment:   Good  Impulse Control:  Good   Risk Assessment: Danger to Self:  No Self-injurious Behavior: No Danger to Others: No Duty to Warn:no Physical Aggression / Violence:No  Access to Firearms a concern: No  Gang Involvement:No   Subjective:  Patient in for session today actively participating and working on her depression, anxiety, irritability, and negative self-talk decreased some. Reports she has "done better in managing certain stressors especially related to work and home. More likely to confront her anxiety more and having some success. Participating well in session.  Focused more heavily on her negative self talk and how it tends to support her depression and anxiety.  Given examples of more positive self talk.  Shares she is trying to establish a more positive outlook, as she is still working on finding a job in her field. Continues working getting ready for [employer]  which is stressful "but also fun".  Continues to report less negativity or "drama" within her  family and that things have seem calmer with her grandfather and his illness.  Continue to work on self acceptance, without comparing self to others.  Keep up her journaling and trying to intercept negative behavior patterns that originate with negative thoughts.  Interventions: Cognitive Behavioral Therapy and Ego-Supportive  Long term goal: Stabilize anxiety level while increasing ability to function on a daily basis, as Evidenced by patient being able to function daily without feeling her anxiety level impedes her functioning level.[name] term goal: Increase understanding of beliefs and messages that produce worry and amxiety, as Evidenced by patient clearly stating her improved understanding of negative/anxious thought patterns and how they affect how they lead to worry and anxiety. Strategies: 1)Help patient develop reality-based, positive cognitive messages. 2)Increase daily use of positive self-talk  3) refrain from assuming worst-case scenarios  Diagnosis:   ICD-10-CM   1. Generalized anxiety disorder  F41.1      Plan:  Patient continues her work today on anxiety, depression, self negating, healthier boundaries and setting limits with other people, and some family communication issues.  She is progressing and is committed to continuing her work with goal-directed behaviors to keep moving and a more positive and healthier direction. Reminded and encouraged patient to be practicing more positive and self affirming behaviors between sessions including: Refrain from assuming worst-case scenarios, practice not putting things off that she needs to accomplish, recognize and emphasize her strengths rather than her perceived weaknesses, look for more positives versus negatives each day, remain  in contact with supportive people, challenge and counteract her self-doubt, avoid making assumptions about what other people may be thinking and instead asked them, and recognize the strengths she shows  working with goal-directed behaviors to move in a direction that supports her overall improved emotional health and her outlook into the future.  Goal review and progress/challenges noted with patient.  Next appointment within 2 weeks.   [name], LCSW

## 2023-06-04 ENCOUNTER — Ambulatory Visit: Payer: 59 | Admitting: Psychiatry

## 2023-06-04 DIAGNOSIS — F411 Generalized anxiety disorder: Secondary | ICD-10-CM | POA: Diagnosis not present

## 2023-06-04 NOTE — Progress Notes (Signed)
[facility] Counselor/Therapist Progress Note  Patient ID: Vanessa Doyle, MRN: [id number],    Date: 06/04/2023  Time Spent:  53 minutes  Treatment Type: Individual Therapy  Reported Symptoms: appetite improving, anxiety, depression improving   Mental Status Exam:  Appearance:   Casual     Behavior:  Appropriate, Sharing, and Motivated  Motor:  Normal  Speech/Language:   Clear and Coherent  Affect:  Some anxiety and depression  Mood:  anxious and depressed  Thought process:  goal directed  Thought content:    WNL  Sensory/Perceptual disturbances:    WNL  Orientation:  oriented to person, place, time/date, situation, day of week, month of year, year, and stated date of Oct. 24, 2024  Attention:  Good  Concentration:  Good and Fair  Memory:  WNL  Fund of knowledge:   Good  Insight:    Good and Fair  Judgment:   Good  Impulse Control:  Good   Risk Assessment: Danger to Self:  No Self-injurious Behavior: No Danger to Others: No Duty to Warn:no Physical Aggression / Violence:No  Access to Firearms a concern: No  Gang Involvement:No   Subjective:  Patient today participating well as she focuses on her anxiety, irritability, depression. Negative self-talk still decreasing some. Feels she has managed certain stressors more effectively, and some decrease in negative self-talk, all part of our work today. More examples of positive self talk as this is a significant need for patient. Slept better more recently and hopes it will continue. Feels some more energy this morning than recent days. Looking at examples of positive self-talk as she continues to work on improving her self talk for longer periods Continues working on positive self talk, self-acceptance,  and trying to see more of the positives with others in family. Looking for job and still having difficult time, although today spoke about possibly learning "[employer]" (computer generated info).  Self-acceptance some better and  less comparing self to others. Encouraged patient to keep up her journaling outside of sessions re: negative behaviors and negative thoughts.   Interventions: Cognitive Behavioral Therapy and Ego-Supportive  Long term goal: Stabilize anxiety level while increasing ability to function on a daily basis, as Evidenced by patient being able to function daily without feeling her anxiety level impedes her functioning level.Vanessa Doyle term goal: Increase understanding of beliefs and messages that produce worry and amxiety, as Evidenced by patient clearly stating her improved understanding of negative/anxious thought patterns and how they affect how they lead to worry and anxiety. Strategies: 1)Help patient develop reality-based, positive cognitive messages. 2)Increase daily use of positive self-talk  3) refrain from assuming worst-case scenarios  Diagnosis:   ICD-10-CM   1. Generalized anxiety disorder  F41.1      Plan: Patient today continues working on her anxiety, healthier boundaries, depression, self negating, communication skills, and setting limits within the family and other people.  She is showing progress and needs to remain committed to working with goal-directed behaviors in order to move forward in a positive and healthier direction. Reminded and encouraged patient to be practicing more positive and self affirming behaviors between sessions including: Refraining from assuming worst-case scenarios, practice not putting things off that she needs to accomplish, recognize and emphasize her strengths rather than her perceived weaknesses, look for more positives versus negatives daily, stay in contact with supportive people, challenge and counteract her self-doubt, avoid making assumptions about what other people may be thinking and instead ask them, and  realize the strength she shows working with goal-directed behaviors to move in a direction that supports her overall improved emotional health and  wellbeing.  Goal review and progress/challenges noted with patient.  Next appointment within 2 weeks.   Vanessa Fare, LCSW

## 2023-06-11 ENCOUNTER — Ambulatory Visit: Payer: 59 | Admitting: Psychiatry

## 2023-06-11 DIAGNOSIS — F411 Generalized anxiety disorder: Secondary | ICD-10-CM | POA: Diagnosis not present

## 2023-06-11 NOTE — Progress Notes (Signed)
[facility] Counselor/Therapist Progress Note  Patient ID: Vanessa Doyle, MRN: [id number],    Date: 06/11/2023  Time Spent: 50 minutes   Treatment Type: Individual Therapy  Reported Symptoms: anxiety, depression and appetite is improving   Mental Status Exam:  Appearance:   Casual     Behavior:  Appropriate, Sharing, and Motivated  Motor:  Normal  Speech/Language:   Clear and Coherent  Affect:  Anxious, depressed  Mood:  anxious and depressed  Thought process:  normal  Thought content:    Rumination and some obsessive thoughts  Sensory/Perceptual disturbances:    WNL  Orientation:  oriented to person, place, time/date, situation, day of week, month of year, year, and stated date of Oct. 31, 2024  Attention:  Fair  Concentration:  Good and Fair  Memory:  WNL  Fund of knowledge:   Good  Insight:    Good  Judgment:   Good  Impulse Control:  Good   Risk Assessment: Danger to Self:  No Self-injurious Behavior: No Danger to Others: No Duty to Warn:no Physical Aggression / Violence:No  Access to Firearms a concern: No  Gang Involvement:No   Subjective:   Patient participating well in session today as she continues to work on her anxiety, some depression, and irritability although improving.  Also working on more effective boundaries with others. Looking more at the "things that bother me in family gathering" including family dynamics that impact patient in negative ways as she explained today. Trying to work with the negative talk/self-talk and does feel that is decreasing some.  Also noticing her management of stressors more effectively at times.  Reviewed some of her more positive self talk, her ability to be more self accepting as these are both critical for her at this point especially in some of the directions she is wanting to move to be more successful in her chosen line of work and also a better communicator within the family environment.  Continuing her job search,  although it has been stressful as she has not received any responses to applications yet.  Is staying motivated and continuing her search despite the challenges involved.  Less comparing herself to others, which is definite progress for her.  She continues to journal outside of sessions (often regarding negative thoughts and negative behaviors which have decreased.  The journaling has proven to be helpful also.  Interventions: Cognitive Behavioral Therapy and Ego-Supportive  Long term goal: Stabilize anxiety level while increasing ability to function on a daily basis, as Evidenced by patient being able to function daily without feeling her anxiety level impedes her functioning level.[name] term goal: Increase understanding of beliefs and messages that produce worry and amxiety, as Evidenced by patient clearly stating her improved understanding of negative/anxious thought patterns and how they affect how they lead to worry and anxiety. Strategies: 1)Help patient develop reality-based, positive cognitive messages. 2)Increase daily use of positive self-talk  3) refrain from assuming worst-case scenarios  Diagnosis:   ICD-10-CM   1. Generalized anxiety disorder  F41.1      Plan:  Patient in session today and continuing her work on healthier boundaries, anxiety, communication skills not self negating, and working to have healthier limits within her immediate family and other people.  She has made progress and needs to remain committed to working with her goal-directed behaviors in order to keep moving forward in a more positive and healthier direction. Reminded and encouraged patient to be practicing more positive and  self affirming behaviors between sessions including: Refrain from assuming worst-case scenarios, practice not putting things off that she needs to accomplish now, recognize and emphasize her strengths rather than her perceived weaknesses, look for more positives versus negatives daily,  stay in contact with supportive people, challenging counteract her self-doubt, avoid making assumptions about what other people may be thinking and instead asked them, and recognize the strengths she shows working with goal-directed behaviors to move in a direction that supports her overall improved emotional health and her outlook into the future.  Goal review and progress/challenges noted with patient.  Next appointment within 2 weeks.   [name], LCSW

## 2023-06-18 ENCOUNTER — Ambulatory Visit: Payer: 59 | Admitting: Psychiatry

## 2023-06-18 DIAGNOSIS — F411 Generalized anxiety disorder: Secondary | ICD-10-CM

## 2023-06-18 NOTE — Progress Notes (Signed)
[facility] Counselor/Therapist Progress Note  Patient ID: Vanessa Doyle, MRN: [id number],    Date: 06/18/2023  Time Spent: 53 minutes   Treatment Type: Individual Therapy  Reported Symptoms: anxiety, depression, view of self is improving   Mental Status Exam:  Appearance:   Casual and Neat     Behavior:  Appropriate, Sharing, and Motivated  Motor:  Normal  Speech/Language:   Clear and Coherent  Affect:  Depressed and anxiety  Mood:  anxious and depressed  Thought process:  goal directed  Thought content:    Rumination  Sensory/Perceptual disturbances:    WNL  Orientation:  oriented to person, place, time/date, situation, day of week, month of year, year, and stated date of Nov. 7, 2024  Attention:  Good  Concentration:  Good  Memory:  WNL  Fund of knowledge:   Good  Insight:    Good and Fair  Judgment:   Good  Impulse Control:  Good   Risk Assessment: Danger to Self:  No Self-injurious Behavior: No Danger to Others: No Duty to Warn:no Physical Aggression / Violence:No  Access to Firearms a concern: No  Gang Involvement:No   Subjective:   Patient in for session today focusing on her anxiety, family relationships, communication skills, healthier boundaries, and further decreasing her self negating. Some strong feelings from family re: political views but that has decreased some. Getting massages, walking, doing yoga are all helping. Work slowed down some and that feels good.Trying to be better about thinking before buying. Reading more and that helps.Trying to get better sleep. 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  Is doing better with some boundary issues and making choices in her communication with others.  Family communication-working specifically on this.  Less negative self talk.  Working to believe more in herself and not be a self judging.  Also  trying to do less comparing of herself to others and she is noting some progress.  Continues to use journaling as a helpful tool in between sessions.  Interventions: Cognitive Behavioral Therapy and Ego-Supportive  Long term goal: Stabilize anxiety level while increasing ability to function on a daily basis, as Evidenced by patient being able to function daily without feeling her anxiety level impedes her functioning level.[name] term goal: Increase understanding of beliefs and messages that produce worry and amxiety, as Evidenced by patient clearly stating her improved understanding of negative/anxious thought patterns and how they affect how they lead to worry and anxiety. Strategies: 1)Help patient develop reality-based, positive cognitive messages. 2)Increase daily use of positive self-talk  3) refrain from assuming worst-case scenarios   Diagnosis:   ICD-10-CM   1. Generalized anxiety disorder  F41.1      Plan:  Patient today continuing in session to work on her anxiety, communication skills, stopping her self negating, healthier boundaries, and working more intentionally to have better boundaries within her immediate family.  Patient has made progress and agrees that she needs to remain committed to working with her goal-directed behaviors so as to keep moving forward in a more positive and healthier direction.  Reminded and encouraged patient to be practicing more positive and self affirming behaviors between sessions including: Practice not putting things off that she needs to accomplish nail, recognize and emphasize her strengths rather than her perceived weaknesses, refrain from assuming worst-case scenarios, look for more positives  versus negatives daily, stay in contact with supportive people, challenge and counteract her self-doubt, avoid making assumptions about what others may be thinking and instead ask them, and realize the strength she shows working with goal-directed behaviors  to move in a direction that supports her overall improved emotional health and wellbeing.  Goal review and progress/challenges noted with patient.  Next appointment within 2 weeks.   [name], LCSW

## 2023-06-24 ENCOUNTER — Ambulatory Visit: Payer: 59 | Admitting: Psychiatry

## 2023-06-24 DIAGNOSIS — F411 Generalized anxiety disorder: Secondary | ICD-10-CM

## 2023-06-24 NOTE — Progress Notes (Signed)
[facility] Counselor/Therapist Progress Note  Patient ID: Vanessa Doyle, MRN: [id number],    Date: 06/24/2023  Time Spent: 58 minutes   Treatment Type: Individual Therapy  Reported Symptoms:   anxiety, some depression, and notes again that her view of herself is improving a "little" which is significant for this patient.   Mental Status Exam:  Appearance:   Casual     Behavior:  Appropriate, Sharing, and Motivated  Motor:  Normal  Speech/Language:   Clear and Coherent  Affect:  Depressed and anxious  Mood:  anxious and depressed  Thought process:  goal directed  Thought content:    Ruminating "but reduced"  Sensory/Perceptual disturbances:    WNL  Orientation:  oriented to person, place, time/date, situation, day of week, month of year, year, and stated date of Nov. 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 Brought in her journal to share and process more her thoughts and feelings. Concerned that she has not  found a job yet, and "feeling a little lost" and easy to feel overwhelmed when people ask her about her plan and getting a job. Working through some self-esteem issues along with family communication concerns, and choices re: "pleasing others" or following her hopes and dreams. Encouraged to continue positive self-care as noted more fully in prior session. Doing better with some choices she is trying to make. Increased insight into family communication  patterns and interactions. Reports sleep has improved some. Continues focus on job search and some decisions she is trying to thing through. Self-talk less negative.  Seems to be believing more in herself with decreased self judging.  Refraining more from comparing herself to others.  Encouraged patient to continue her journaling as it proves to be very helpful to her in thinking through decisions especially.  Interventions: Cognitive Behavioral Therapy and Ego-Supportive  Long term goal: Stabilize anxiety level while increasing ability to function on a daily basis, as Evidenced by patient being able to function daily without feeling her anxiety level impedes her functioning level.Vanessa Doyle term goal: Increase understanding of beliefs and messages that produce worry and amxiety, as Evidenced by patient clearly stating her improved understanding of negative/anxious thought patterns and how they affect how they lead to worry and anxiety. Strategies: 1)Help patient develop reality-based, positive cognitive messages. 2)Increase daily use of positive self-talk  3) refrain from assuming worst-case scenarios  Diagnosis:   ICD-10-CM   1. Generalized anxiety disorder  F41.1      Plan:  Patient today in session showing good motivation and working further on her anxiety, communication skills especially within the family environment, reducing her self-negating, and creating healthier boundaries.  She is making progress and needs to remain on task and working with her goal-directed behaviors so as to move forward in a positive and healthier direction. Reminded and encouraged patient in her practice of more positive and self affirming behaviors between sessions including: Recognize  and emphasize her strengths rather than her perceived weaknesses, practice not putting things off that she needs to accomplish now, refrain from assuming worst-case scenarios, look for more positives versus negatives daily, stay in  contact with supportive people, challenging counteract her self-doubt, avoid making assumptions about what others may be thinking and instead asked them, and recognize the strength she shows working with her goal-directed behaviors to move in a direction that supports her improved emotional health and her outlook into the future.  Goal review and progress/challenges noted with patient.  Next appointment within 2 weeks.   Vanessa Fare, LCSW

## 2023-07-01 ENCOUNTER — Ambulatory Visit: Payer: 59 | Admitting: Psychiatry

## 2023-07-01 DIAGNOSIS — F411 Generalized anxiety disorder: Secondary | ICD-10-CM

## 2023-07-01 NOTE — Progress Notes (Signed)
[facility] Counselor/Therapist Progress Note  Patient ID: Vanessa Doyle, MRN: [id number],    Date: 07/01/2023  Time Spent: 53 minutes   Treatment Type: Individual Therapy  Reported Symptoms: anxiety, depression, and view of self remains improved "a little"   Mental Status Exam:  Appearance:   Casual     Behavior:  Appropriate, Sharing, and Motivated  Motor:  Normal  Speech/Language:   Clear and Coherent  Affect:  Anxious, some depression  Mood:  anxious and depressed  Thought process:  goal directed  Thought content:    Rumination and it has improved some  Sensory/Perceptual disturbances:    WNL  Orientation:  oriented to person, place, time/date, situation, day of week, month of year, year, and stated date of Nov. 20, 2024  Attention:  Fair  Concentration:  Fair  Memory:  WNL  Fund of knowledge:   Good  Insight:    Good  Judgment:   Good  Impulse Control:  Good   Risk Assessment: Danger to Self:  No Self-injurious Behavior: No Danger to Others: No Duty to Warn:no Physical Aggression / Violence:No  Access to Firearms a concern: No  Gang Involvement:No   Subjective:  Patient in session today reporting anxiety, some issues within family relationships and communication, working on healthier boundaries, and working to improve her view of herself. Continues journaling and shared more today especially personal and family stressors that are heightening due to some family illness and relationship challenges. Processing also today her concerns about not having gotten a job in her field yet which adds to her anxiety and depression. Discussed strategies to help her in feeling more confident in decision-making and in having more belief and faith in herself and her abilities. Reports some growth in her not stressing out in "pleasing others", and instead practicing healthier boundaries. Focused on making positive choices. 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  Less self negating.  Less comparing herself to others.  She is to continue using her journaling as that has been quite helpful for her.  Interventions: Cognitive Behavioral Therapy and Ego-Supportive  Long term goal: Stabilize anxiety level while increasing ability to function on a daily basis, as Evidenced by patient being able to function daily without feeling her anxiety level impedes her functioning level.[name] term goal: Increase understanding of beliefs and messages that produce worry and amxiety, as Evidenced by patient clearly stating her improved understanding of negative/anxious thought patterns and how they affect how they lead to worry and anxiety. Strategies: 1)Help patient develop reality-based, positive cognitive messages. 2)Increase daily use of positive self-talk  3) refrain from assuming worst-case scenarios   Diagnosis:   ICD-10-CM   1. 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  Patient today in session showing good motivation and working further on her anxiety, communication skills especially within the family environment, reducing her self-negating, and creating healthier boundaries.  Encouraged patient in her practice of more positive and self affirming behaviors between sessions including: Practice not putting things off that she needs to accomplish now, refrain from assuming worst-case scenarios, looking for more positives versus negatives daily, recognize  and emphasize her strengths rather than her perceived weaknesses, staying in contact with supportive people, challenge and counteract her self-doubt, avoid making assumptions about what  others may be thinking and instead ask them, and realize the strength she shows working with goal-directed behaviors to move in a direction that supports a better outlook for her into the future and also supports her improved emotional health.  Patient is making progress and needs to continue her work on goal-directed behaviors to keep moving forward and a healthier and more positive direction.  Goal review and progress/challenges noted with patient.  Next appointment within 2 weeks.   [name], LCSW

## 2023-07-16 ENCOUNTER — Ambulatory Visit: Payer: 59 | Admitting: Psychiatry

## 2023-07-16 DIAGNOSIS — F411 Generalized anxiety disorder: Secondary | ICD-10-CM

## 2023-07-16 NOTE — Progress Notes (Signed)
[facility] Counselor/Therapist Progress Note  Patient ID: Vanessa Doyle, MRN: [id number],    Date: 07/16/2023  Time Spent: 55 minutes   Treatment Type: Individual Therapy  Reported Symptoms: anxiety, depression   Mental Status Exam:  Appearance:   Casual and Neat     Behavior:  Appropriate, Sharing, and Motivated  Motor:  Normal  Speech/Language:   Clear and Coherent  Affect:  Anxious, depressed  Mood:  anxious and depressed  Thought process:  goal directed  Thought content:    Obsessive thoughts and ruminating "decreased"  Sensory/Perceptual disturbances:    WNL  Orientation:  oriented to person, place, time/date, situation, day of week, month of year, year, and stated date of Dec. 5, 2024  Attention:  Fair  Concentration:  Good and Fair  Memory:  WNL  Fund of knowledge:   Good  Insight:    Good  Judgment:   Good  Impulse Control:  Good   Risk Assessment: Danger to Self:  No Self-injurious Behavior: No Danger to Others: No Duty to Warn:no Physical Aggression / Violence:No  Access to Firearms a concern: No  Gang Involvement:No   Subjective:  Patient in session today reporting anxiety, family issues and challenges in relationships, trying to develop healthier boundaries and improve her self esteem.  She currently today is focusing on the change of "putting my grandfather under hospice". Processed her own thoughts and feelings re: grandfather's decline and she has been close with him over the years. Lots of family issues got stirred up over the Thanksgiving holidays and patient very concerned and needing to talk through situations that occurred as she has really struggled with some of the family dynamics, but also disturbed due to the unexpected death of longtime neighbor of family at Thanksgiving. Reports that she is working on having healthier boundaries and being able to set healthy limits. Also feels she is making some progress in her own self-esteem and it is a  work in progress. Continues her work on her self esteem, journaling, as she works to feel better about herself and be able to feel more confident in herself as she tries to move forward on her goals. Decreased focus on trying to "please everybody else" and more practice of healthier boundaries for herself.  Sleep improved.  Less negating of herself.  No comparing herself to others today.  Encouraged to continue using her journaling and some of the reading that she is doing that is helpful to her as she continues working on her goals.  Interventions: Cognitive Behavioral Therapy, Solution-Oriented/Positive Psychology, and Ego-Supportive  Long term goal: Stabilize anxiety level while increasing ability to function on a daily basis, as Evidenced by patient being able to function daily without feeling her anxiety level impedes her functioning level.Vanessa Doyle term goal: Increase understanding of beliefs and messages that produce worry and amxiety, as Evidenced by patient clearly stating her improved understanding of negative/anxious thought patterns and how they affect how they lead to worry and anxiety. Strategies: 1)Help patient develop reality-based, positive cognitive messages. 2)Increase daily use of positive self-talk  3) refrain from assuming worst-case scenarios   Diagnosis:   ICD-10-CM   1. Generalized anxiety disorder  F41.1      Plan:  Patient today in session processing further some of her anxiety, family issues and challenges within their relationships, working on healthier boundaries, and trying to elevate her self-esteem.  Is showing progress in each of these areas.  Her progress in self  acceptance and improving her self-esteem is especially noticeable even though it is a work in progress.  Better able to speak up within the family context which is also helping her self-esteem, and lessening her self-negating.  Reminded and encouraged patient and practicing more positive and self affirming  behaviors between sessions including: Refrain from putting things off that she needs to accomplish now, refrain from assuming worst-case scenarios, look for more positives versus negatives daily, recognize and emphasize her strengths rather than perceived weaknesses, remain in contact with supportive people, challenge and counteract her self-doubt, avoid making assumptions about what others may be thinking and instead asked them, and realize the strength she shows working with goal-directed behaviors to move in a direction that supports a better outlook for her into the future and her overall improved emotional health.  As noted in session today, patient has made progress and needs to continue working with goal-directed behaviors to keep moving forward and a more hopeful and healthier direction.  Goal review and progress/challenges noted with patient.  Next appointment within 2 weeks.  [name], LCSW

## 2023-07-29 ENCOUNTER — Ambulatory Visit: Payer: 59 | Admitting: Psychiatry

## 2023-07-29 DIAGNOSIS — F411 Generalized anxiety disorder: Secondary | ICD-10-CM | POA: Diagnosis not present

## 2023-07-29 NOTE — Progress Notes (Signed)
[facility] Counselor/Therapist Progress Note  Patient ID: Vanessa Doyle, MRN: [id number],    Date: 07/29/2023  Time Spent: 53 minutes  Treatment Type: Individual Therapy  Reported Symptoms: anxiety, depression (some better), family stressors   Mental Status Exam:  Appearance:   Casual     Behavior:  Appropriate, Sharing, and Motivated  Motor:  Normal  Speech/Language:   Clear and Coherent  Affect:  Anxious, some depression  Mood:  anxious and depressed  Thought process:  goal directed  Thought content:    WNL  Sensory/Perceptual disturbances:    WNL  Orientation:  oriented to person, place, time/date, situation, day of week, month of year, year, and stated date of Dec. 18, 2024  Attention:  Fair  Concentration:  Fair  Memory:  WNL  Fund of knowledge:   Good  Insight:    Good and Fair  Judgment:   Good  Impulse Control:  Good   Risk Assessment: Danger to Self:  No Self-injurious Behavior: No Danger to Others: No Duty to Warn:no Physical Aggression / Violence:No  Access to Firearms a concern: No  Gang Involvement:No   Subjective:  Patient arriving for session today and reports symptoms including anxiety, family relationship issues, her need to develop healthier boundaries, and working to improve her self-esteem.  She has struggled some more recently with family issues and some grief surrounding decisions being made for her grandfather who just recently was planning to "go under hospice care" which was a concern for patient and processed this some last session. Needing session today to work further on specific relationships issues especially with her mother and some communication strategies, and the ability to "let go" of some things and not overly obsess on family dynamics, and refrain from assuming negatives within family. Discussed some ways patient can do this effectively and how it could improve her way of viewing challenges within family.  Less emphasis on  "trying to please everybody and keep everybody happy".  Also decreased self negating, some increased confidence, trying to practice having healthier boundaries, less comparing herself to others, and continues to work on her self-esteem and journaling continues to be a helpful tool.  Interventions: Cognitive Behavioral Therapy and Ego-Supportive  Long term goal: Stabilize anxiety level while increasing ability to function on a daily basis, as Evidenced by patient being able to function daily without feeling her anxiety level impedes her functioning level.[name] term goal: Increase understanding of beliefs and messages that produce worry and amxiety, as Evidenced by patient clearly stating her improved understanding of negative/anxious thought patterns and how they affect how they lead to worry and anxiety. Strategies: 1)Help patient develop reality-based, positive cognitive messages. 2)Increase daily use of positive self-talk  3) refrain from assuming worst-case scenarios  Diagnosis:   ICD-10-CM   1. Generalized anxiety disorder  F41.1      Plan:  Patient in session today and continues working on some family issues and relationship challenges, anxiety, establishing healthier boundaries, and working to improve her self-esteem.  Definite progress being made especially in her self-esteem and self acceptance.  Contributes more to family conversation and some decisions within the family context.  Self negating his decreased.  Encouraged patient in her practice of more positive and self affirming behaviors outside of sessions including: Refrain from putting things off that she needs to accomplish now, refrain from assuming worst-case scenarios, look for more positives versus negatives daily, recognize and emphasize her strengths rather than perceived weaknesses, remain  in contact with supportive people, challenge and counteract her self-doubt, avoid making assumptions about what others may be thinking  and instead ask them, and recognize the strength she shows working with goal-directed behaviors to move in a direction that supports a better outlook for her into the future and her overall wellbeing.  Patient has definitely made progress and needs to continue her work with treatment goals to keep moving in a healthier and more hopeful direction.  Goal review and progress/challenges noted with patient.  Next appointment within 2 weeks.   [name], LCSW

## 2023-08-03 ENCOUNTER — Ambulatory Visit: Payer: 59 | Admitting: Psychiatry

## 2023-08-13 ENCOUNTER — Ambulatory Visit: Payer: 59 | Admitting: Psychiatry

## 2023-08-13 DIAGNOSIS — F411 Generalized anxiety disorder: Secondary | ICD-10-CM | POA: Diagnosis not present

## 2023-08-13 NOTE — Progress Notes (Signed)
 [facility] Counselor/Therapist Progress Note  Patient ID: Vanessa Doyle, MRN: [id number],    Date: 08/13/2023  Time Spent: 55 minutes   Treatment Type: Individual Therapy  Reported Symptoms: depression, anxiety, family stressors   Mental Status Exam:  Appearance:   Casual     Behavior:  Appropriate, Sharing, and Motivated  Motor:  Normal  Speech/Language:   Clear and Coherent  Affect:  Depressed and anxious  Mood:  anxious and depressed  Thought process:  goal directed  Thought content:    Rumination  Sensory/Perceptual disturbances:    WNL  Orientation:  oriented to person, place, time/date, situation, day of week, month of year, year, and stated date of Jan. 2, 2025  Attention:  Fair  Concentration:  Good and Fair  Memory:  WNL  Fund of knowledge:   Good  Insight:    Good  Judgment:   Good  Impulse Control:  Good   Risk Assessment: Danger to Self:  No Self-injurious Behavior: No Danger to Others: No Duty to Warn:no Physical Aggression / Violence:No  Access to Firearms a concern: No  Gang Involvement:No   Subjective:   Patient in session today and reporting anxiety, her continued need for healthier boundaries, issues within family relationships that impact her, and realizing she needs to improve her own self-esteem. Shared journaling from her therapy journal re: boundaries and family relationship issues. Journaling helping me prioritize and weed out some things that I need to let of. Shared recent journaling she did over the holidays especially issues related to just me, and also my family and relationships.  Recognizing her overthinking and working with this, noting that lists and to-do lists will help. Some stressed out feelings around family issues, a lot of which I can't control and in other cases it's not my issue to fix, and processed a few of these in session today, and looked at how she can continue certain choices, certain behaviors, and more  thought-through limit-setting, some for herself and some related to others including family and extended family.  Good motivation and participation in session today.  Working to let go of trying to please everybody and keep everybody else happy.  Noticeable decrease in self negating, practicing healthier boundaries, improved some of her self-esteem issues, and less comparing herself to others.  Continues to journal and finds it helpful especially as she brings it into session with her.  Interventions: Cognitive Behavioral Therapy and Ego-Supportive  Long term goal: Stabilize anxiety level while increasing ability to function on a daily basis, as Evidenced by patient being able to function daily without feeling her anxiety level impedes her functioning level.[name] term goal: Increase understanding of beliefs and messages that produce worry and amxiety, as Evidenced by patient clearly stating her improved understanding of negative/anxious thought patterns and how they affect how they lead to worry and anxiety. Strategies: 1)Help patient develop reality-based, positive cognitive messages. 2)Increase daily use of positive self-talk  3) refrain from assuming worst-case scenarios  Diagnosis:   ICD-10-CM   1. Generalized anxiety disorder  F41.1      Plan:   Patient today actively participating in session as she continues working on her anxiety, family issues, relationship challenges, establishing and keeping healthier boundaries, and working on elevating her self-esteem.  She has shown progress especially in her own self acceptance and in her self-esteem.  Is more actively contributing to family conversations and decision making within the whole family context.  Her self negating  and judging has decreased.  As she continues to progress, she desires to continue working with goal-directed behaviors and keep her motivation at a higher level. Encouraged patient in her practice of more positive and self  affirming behaviors outside of sessions including: Refrain from assuming worst-case scenarios, refrain from putting things off that she needs to accomplish now, look for more positives versus negatives each day, recognize and emphasize her strengths rather than perceived weaknesses, stay in contact with supportive people, challenge and counteract her self-doubt, avoid making assumptions about what others may be thinking and instead asked them, and realize the strength she shows working with goal-directed behaviors to move in a direction that supports a better outlook for her into the future and in regards to her overall wellbeing.  She continues to progress and needs to continue working with goal-directed behaviors in order to continue moving and a more positive and healthier direction.  Goal review and progress/challenges noted with patient.  Next appointment within 2 weeks.   [name], LCSW

## 2023-08-20 ENCOUNTER — Ambulatory Visit (INDEPENDENT_AMBULATORY_CARE_PROVIDER_SITE_OTHER): Payer: 59 | Admitting: Psychiatry

## 2023-08-20 DIAGNOSIS — F411 Generalized anxiety disorder: Secondary | ICD-10-CM

## 2023-08-20 NOTE — Progress Notes (Signed)
 [facility] Counselor/Therapist Progress Note  Patient ID: Vanessa Doyle, MRN: [id number],    Date: 08/20/2023  Time Spent: 55 minutes   Treatment Type: Individual Therapy  Reported Symptoms: depression, family stressors, anxiety   Mental Status Exam:  Appearance:   Casual     Behavior:  Appropriate, Sharing, and Motivated  Motor:  Normal  Speech/Language:   Clear and Coherent  Affect:  Depressed and anxious  Mood:  anxious and depressed  Thought process:  goal directed  Thought content:    Rumination  Sensory/Perceptual disturbances:    WNL  Orientation:  oriented to person, place, time/date, situation, day of week, month of year, year, and stated date of Jan. 9, 2025  Attention:  Good  Concentration:  Good and Fair  Memory:  WNL  Fund of knowledge:   Good  Insight:    Good  Judgment:   Good and Fair  Impulse Control:  Good   Risk Assessment: Danger to Self:  No Self-injurious Behavior: No Danger to Others: No Duty to Warn:no Physical Aggression / Violence:No  Access to Firearms a concern: No  Gang Involvement:No   Subjective:  Patient in today and reporting anxiety, frustration, some depression, and some obsessive thoughts/rumination. Confronting some family relationship issues that can be sensitive and easy to avoid. Concerned about getting a job in her field and is focusing more on that now that new year has begun.  Recognizing her continued difficulty in follow through on some issues and is trying to settle down now and be more intentional with job search. Very sensitive to any feedback that seems critical from parents. They are encouraging her to pursue her job search, and patient does seem to struggle with getting started.  Today looked at ways of motivating herself and take more steps to be productive. Difficulty taking initial steps and not postponing. Encouraged patient to look at that habit as it seems to be blocking her from any movement forward  and she sees this too. Discussed strategies that can help motivate her more in this particular area. Difficulty still living with her parents and processed these feelings as well as her hopes for having her own place in the future as she feels that would help her mood a lot as well as promote more self-confidence.  Not as stressed nor is sad by end of session today.  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  Still working to let go of the need to control things.  Courage to continue her journaling which has been helpful.  States she is also working on letting go of trying to please everybody and keep everybody happy, understanding that that is not really very possible.  Interventions: Cognitive Behavioral Therapy and Ego-Supportive Long term goal: Stabilize anxiety level while increasing ability to function on a daily basis, as Evidenced by patient being able to function daily without feeling her anxiety level impedes her functioning level.[name] term goal: Increase understanding of beliefs and messages that produce worry and amxiety, as Evidenced by patient clearly stating her improved understanding of negative/anxious thought patterns and how they affect how they lead to worry and anxiety. Strategies: 1)Help patient develop reality-based, positive cognitive messages. 2)Increase daily use of positive self-talk  3) refrain from assuming worst-case scenarios  Diagnosis:   ICD-10-CM   1. Generalized anxiety disorder  F41.1  Plan:  Patient today continues working with strategies to decrease her anxiety, better manage family issues and relationship challenges, and trying to keep healthier boundaries, while working on improving her self-esteem.  Less judging of herself and less self negating.  Overall, motivation remains improved.  Encouraged patient in her practice of more positive and self affirming  behaviors outside of sessions including: Refrain from assuming worst-case scenarios, avoid putting things off that she needs to accomplish now, look for more positives versus negatives daily, recognize and emphasize her strengths rather than perceived weaknesses, stay in contact with supportive people, challenge and counteract her self-doubt, avoid making assumptions about what others may be thinking and instead ask them, and recognize the strengths she shows working with goal-directed behaviors as she moves in a direction that supports a better outlook for her into the future and an overall sense of wellbeing.  Patient does work with strategies discussed in session and she has shown progress.  Needs to continue working with goal-directed behaviors in order to keep moving in a theater direction.  Goal review and progress/challenges noted with patient.  Next appointment within 2 weeks.   [name], LCSW

## 2023-08-27 ENCOUNTER — Ambulatory Visit: Payer: 59 | Admitting: Psychiatry

## 2023-08-27 DIAGNOSIS — F411 Generalized anxiety disorder: Secondary | ICD-10-CM | POA: Diagnosis not present

## 2023-08-27 NOTE — Progress Notes (Signed)
[facility] Counselor/Therapist Progress Note  Patient ID: Vanessa Doyle, MRN: [id number],    Date: 08/27/2023  Time Spent: 53 minutes   Treatment Type: Individual Therapy  Reported Symptoms: depression, anxiety, job hunting stress, irritability    Mental Status Exam:  Appearance:   Casual and Neat     Behavior:  Appropriate, Sharing, and Motivated  Motor:  Normal  Speech/Language:   Clear and Coherent  Affect:  Depressed and anxious, irritable  Mood:  anxious, depressed, and irritable  Thought process:  goal directed  Thought content:    Rumination  Sensory/Perceptual disturbances:    WNL  Orientation:  oriented to person, place, time/date, situation, day of week, month of year, year, and stated date of Jan. 16, 2025  Attention:  Fair  Concentration:  Good and Fair  Memory:  WNL  Fund of knowledge:   Good  Insight:    Good and Fair  Judgment:   Good  Impulse Control:  Good   Risk Assessment: Danger to Self:  No Self-injurious Behavior: No Danger to Others: No Duty to Warn:no Physical Aggression / Violence:No  Access to Firearms a concern: No  Gang Involvement:No   Subjective:  Patient today in session and reporting anxiety, some obsessive thoughts/ruminating, some depression, frustration, and irritability.  As patient began talking more in session she became very emotional, tearful he expressing how she feels down on herself and that is affecting her ability to step forward and job hunting and in other areas of life.  Shared a lot of her concerns and how she feels they are blocking her from moving forward and that also feels she has not been able to act on any of this.  Did well and talking through some of her obsessive thoughts and ruminating, family relationship issues that are tense at times and patient reports she tends to avoid those because of her own sensitivity, seeing herself in negative ways, and having a difficult time of believing more in herself and  taking positive steps including those that we speak about in therapy sessions.  Feels that parents are critical of her at times when they ask about her pursuing job applications etc. or gives her suggestions on some strategies she might use.  Patient admits she sometimes has difficulty taking suggestions from others as she feels she knows what is best for herself, and this has kept her at times from pursuing certain opportunities.  Worked with patient today on being more flexible, being more willing to hear others out who are truly trying to help her in a good way including her parents at times, ask for what she needs, follow through on some of the suggestions and therapy and maybe consider some that especially her father has given her.  Relationship with mom seems to be a little more volatile and patient seems to communicate better with father.  Today was able to share more openly and talked through more stressors that she felt has held her back and we looked at ways to try and help patient pursue her goals in meaningful ways and being able to stay goal focused even when the way is not always clear and the way is not easy.  Difficulty confronting obstacles and processed this more today including having some goals that she knows she can achieve and having some that she works towards that may require more effort and perseverance, which seem to interest her more.  We will pick back up on this next  session as we ran out of time today but she did seem to leave feeling a little more encouraged and open to trying some new strategies including ones that might help patient and her motivation.  Courage patient further today on trying to not postpone things as much as that seems to get her in difficulty with task completion and as to her stress.  Reviewed some strategies from last therapy session that can help motivate her more.  Has good ideas about being more motivated and following through on task, but the task completion  part is difficult for her.  Discussed several options today that might could help her specifically with completing task, and in working with patient to try to help her be more open to suggestions that others give her rather than and ruling them out without trying as that is part of the problem where she has gotten stuck previously.  Letting go of control is difficult for her and we talked about this more today as it definitely relates to her being more focused and task oriented with her goals.  Seem to feel more relieved after expressing her tearfulness during session and having some "next steps" to work on prior to next session.  Continues to work on "letting go of trying to please everyone and keep everybody happy" while also focusing on some issues revolving around "responsibility."  Interventions: Cognitive Behavioral Therapy and Ego-Supportive  Long term goal: Stabilize anxiety level while increasing ability to function on a daily basis, as Evidenced by patient being able to function daily without feeling her anxiety level impedes her functioning level.Vanessa Doyle term goal: Increase understanding of beliefs and messages that produce worry and amxiety, as Evidenced by patient clearly stating her improved understanding of negative/anxious thought patterns and how they affect how they lead to worry and anxiety. Strategies: 1)Help patient develop reality-based, positive cognitive messages. 2)Increase daily use of positive self-talk  3) refrain from assuming worst-case scenarios  Diagnosis:   ICD-10-CM   1. Generalized anxiety disorder  F41.1      Plan: Patient today in session focusing more on specific strategies to decrease her symptoms including anxiety, family and relationship challenges, having healthier boundaries, and working to improve her self-esteem and overall outlook.  She has decreased her self judging and self negating, while also improving her motivation and in some ways her outlook.   Encouraged patient in continuing to practice more positive and self affirming behaviors outside of sessions including: Refrain from assuming worst-case scenarios, avoid putting things off that she needs to handle now, look for more positives versus negatives daily, recognize and emphasize her strengths rather than perceived weaknesses, staying in contact with supportive people, challenge and counteract her self-doubt, avoid making assumptions about what others may be thinking and instead asked them, and recognize the strength she shows working with goal-directed behaviors as she moves in a direction that supports a better outlook for her future and her overall wellbeing.  She continues to work with strategies as discussed in sessions and has definitely shown progress.  Patient needs to continue working with goal-directed behaviors to keep moving in a more hopeful and healthier direction.  Goal review and progress/challenges noted with patient.  Next appointment within 2 weeks.   Vanessa Fare, LCSW

## 2023-09-03 ENCOUNTER — Ambulatory Visit: Payer: 59 | Admitting: Psychiatry

## 2023-09-03 DIAGNOSIS — F411 Generalized anxiety disorder: Secondary | ICD-10-CM | POA: Diagnosis not present

## 2023-09-03 NOTE — Progress Notes (Signed)
[facility] Counselor/Therapist Progress Note  Patient ID: Vanessa Doyle, MRN: [id number],    Date: 09/03/2023  Time Spent: 50 minutes   Treatment Type: Individual Therapy  Reported Symptoms: anxiety, depression, frustration/irritability/obsessiveness "has decreased some"; working on elevating her motivation more "but I'm not unmotivated either"  Mental Status Exam:  Appearance:   Casual and Neat     Behavior:  Appropriate, Sharing, and motivated "but it slips and slides sometimes depending on circumstances  Motor:  Normal  Speech/Language:   Clear and Coherent  Affect:  Anxious, depressed  Mood:  anxious and depressed  Thought process:  goal directed  Thought content:    Some rumination but "better"  Sensory/Perceptual disturbances:    WNL  Orientation:  oriented to person, place, time/date, situation, day of week, month of year, year, and stated date of Jan. 23, 2025  Attention:  Fair  Concentration:  Fair  Memory:  WNL  Fund of knowledge:   Good  Insight:    Good and Fair  Judgment:   Good  Impulse Control:  Good   Risk Assessment: Danger to Self:  No Self-injurious Behavior: No Danger to Others: No Duty to Warn:no Physical Aggression / Violence:No  Access to Firearms a concern: No  Gang Involvement:No   Subjective:  Patient working further today in session on her anxiety, some depression, frustration, obsessive thoughts/ruminating, and irritability.    Denies any SI. Shared some of her current feelings re: grandfather's health which is declining more recently. Processed a lot of her thoughts and feelings in reference to family relationships that can be tense at times and leads to patient being more negative towards herself but also seems to lock her into an unwillingness to see other options or really hear what is being said at times, and rather take some things that are said in a negative way that may not meant to have been that way.  Typically when these  relationships within the family are tense, patient tends to "lock in" to her own beliefs and messages and being more inflexible.  Process this time with patient today however it is difficult for her to see the ways that she does sometimes walk her self and to certain thoughts and feelings and resists looking at other options or at situations from a different viewpoint.  Does later admit that she sometimes has difficulty trying things in a different way and taking suggestions from other people within the family.  To continue working on her willingness to be more flexible and try new behaviors and new ways of looking at things, asking for what she needs, more follow-through on some of the suggestions discussed in therapy and also some that her father has discussed with her in a very open, not who she nor demanding tone.  Discussed some relationship issues with mom which continues to be a little volatile however patient is able to understand some of the other stressors that mom is dealing with currently that impacts her behavior within the family.  Encouraged her to work on not postponing things that need to be done in a timely manner as that has proven to lead her to more self negating feelings and self talk.  Reviewed some of our prior strategies discussed in session especially ones that can help with motivation and follow-through.  Has found that  working "with small bits works better for her rather than trying to make major changes rapidly.  Does have some "next steps" to work  on again for next session as we did more recently which seemed to be helpful to patient.   Interventions: Cognitive Behavioral Therapy and Ego-Supportive  Long term goal: Stabilize anxiety level while increasing ability to function on a daily basis, as Evidenced by patient being able to function daily without feeling her anxiety level impedes her functioning level.[name] term goal: Increase understanding of beliefs and messages that  produce worry and amxiety, as Evidenced by patient clearly stating her improved understanding of negative/anxious thought patterns and how they affect how they lead to worry and anxiety. Strategies: 1)Help patient develop reality-based, positive cognitive messages. 2)Increase daily use of positive self-talk  3) refrain from assuming worst-case scenarios  Diagnosis:   ICD-10-CM   1. Generalized anxiety disorder  F41.1      Plan:  Patient working well in session today as she seeks to decrease her symptoms of anxiety, having healthier boundaries, being more flexible to see other options and behavioral changes that might be helpful to her, and working to improve her self-esteem and overall outlook.  Her self judging and self negating has decreased some but really needs more work and we are focusing more in that area as this does impact her motivation and outlook. Reminded and encouraged patient in her practice of more positive and self affirming behaviors outside of sessions and in her daily life including: Avoid putting things off that she needs to handle now, look for more positives versus negatives daily, recognize and emphasize her strengths rather than perceived weaknesses, refrain from assuming worst-case scenarios, stay in contact with supportive people, challenge and counteract her self-doubt, avoid making assumptions about what others may be thinking and instead ask them, and realize the strengths she shows working with goal-directed behaviors as she moves in a direction that supports an improved outlook for her future and her overall wellbeing.  Patient does continue to work with strategies as discussed in sessions and has shown progress gradually.  She does need to continue working with goal-directed behaviors to keep moving in a hopeful and more healthier direction.  Goal review and progress/challenges noted with patient.  Next appointment within 2 weeks.   [name],  LCSW

## 2023-09-10 ENCOUNTER — Ambulatory Visit: Payer: 59 | Admitting: Psychiatry

## 2023-09-10 DIAGNOSIS — F411 Generalized anxiety disorder: Secondary | ICD-10-CM

## 2023-09-10 NOTE — Progress Notes (Signed)
[facility] Counselor/Therapist Progress Note  Patient ID: Vanessa Doyle, MRN: [id number],    Date: 09/10/2023  Time Spent: 53 minutes   Treatment Type: Individual Therapy  Reported Symptoms: anxiety, depression but improved some, frustration and irritability "but not as bad", motivation improved some, some grief re: grandfather in Palliative care and not expected to live much longer    Mental Status Exam:  Appearance:   Casual     Behavior:  Appropriate, Sharing, and Motivated  Motor:  Normal  Speech/Language:   Clear and Coherent  Affect:  Depressed and anxious  Mood:  anxious and depressed  Thought process:  goal directed  Thought content:    WNL and Rumination  Sensory/Perceptual disturbances:    WNL  Orientation:  oriented to person, place, time/date, situation, day of week, month of year, year, and stated date of Jan. 30, 2025  Attention:  Good  Concentration:  Good and Fair  Memory:  WNL  Fund of knowledge:   Good  Insight:    Good and Fair  Judgment:   Good  Impulse Control:  Good and Fair   Risk Assessment: Danger to Self:  No Self-injurious Behavior: No Danger to Others: No Duty to Warn:no Physical Aggression / Violence:No  Access to Firearms a concern: No  Gang Involvement:No   Subjective:   Patient in session today focusing more on her anxiety, frustrations, obsessive thoughts/ruminating, some depression, and irritability.  She denies any SI. Grandfather in palliative care at hospital. Continues focus on trying to get a job and not let other things distract her. Needs to be focusing more on job applications but easy to get distracted and not stay focused, looking at what might help versus hurt as patient has not liked people within her family, meaning too much on "what I should or should not be doing".  Did discuss motivation much more at length today as that does seem to be a problem for patient and she was able to acknowledge it more rather than deny  it, and actually engaged in some healthy discussion about what does hold her back and what tends to help motivation versus create distractions and avoidance.  Patient very involved in this discussion which I think was helpful for her today and hopefully can lead to more focus on her goals and having more of a plan of action for herself versus just "brainstorming".  Did share and process some of her thoughts regarding grandfather struggle over the last few months and his declining health.  Patient did quite well and processing thoughts and feelings regarding grandfather and his health including the fact that he likely will not live a lot longer.  Between now and next session, is to work more intentionally on unnecessary postponing of certain tasks she needs to complete and refrain from letting herself get easily distracted and not complete some of the things that she really needs to be completing for the sake of her job [employer].  Encouraged her willingness to be more flexible and try new behaviors and new ways of looking at things including asking for help when she needs it, more follow-through on suggestions and therapy and also some from her father in terms of organization and follow-through on tasks.  Review of prior strategies discussed in sessions especially regarding motivation and follow-through, and next steps.  Interventions: Cognitive Behavioral Therapy and Ego-Supportive  Long term goal: Stabilize anxiety level while increasing ability to function on a daily basis, as Evidenced by  patient being able to function daily without feeling her anxiety level impedes her functioning level.[name] term goal: Increase understanding of beliefs and messages that produce worry and amxiety, as Evidenced by patient clearly stating her improved understanding of negative/anxious thought patterns and how they affect how they lead to worry and anxiety. Strategies: 1)Help patient develop reality-based, positive  cognitive messages. 2)Increase daily use of positive self-talk  3) refrain from assuming worst-case scenarios  Diagnosis:   ICD-10-CM   1. Generalized anxiety disorder  F41.1      Plan:  Patient today following through in her work on her goals and particularly working more on her anxiety, setting healthy boundaries, working to be more motivated and not postponing tasks as much, being more flexible and able to see options for herself and follow-through on some of the behavioral changes we have discussed in sessions that can help her and being more motivated for task completion versus starting multiple tasks and not completing.  In some areas patient has definitely shown progress and needs to continue working to be less self judging and self negating and instead follow through on having healthier boundaries, improving her self-esteem and outlook, so as to go forward and a more hopeful and healthier direction. Reminded and encouraged patient in her practice of more positive and self affirming behaviors outside of sessions and in her daily life including: Avoid procrastinating on things that she needs to handle now, look for more positives versus negatives daily, recognize and emphasize her strengths rather than perceived weaknesses, refrain from assuming worst-case scenarios, stay in contact with supportive people, challenging counteract her self-doubt, avoid making assumptions about what others may be thinking and instead asked them, and recognize the strength she shows working with goal-directed behaviors as she moves in a direction that supports an improved outlook for her future and her overall wellbeing.  Patient does continue to work with strategies as discussed in sessions and has shown gradual progress.  She does need to continue working with goal-directed behaviors to keep moving and a more hopeful and healthier direction.  Will review and progress/challenges noted with patient.  Next appointment  within 2 weeks.   [name], LCSW

## 2023-09-17 ENCOUNTER — Ambulatory Visit: Payer: 59 | Admitting: Psychiatry

## 2023-09-17 DIAGNOSIS — F411 Generalized anxiety disorder: Secondary | ICD-10-CM | POA: Diagnosis not present

## 2023-09-17 NOTE — Progress Notes (Signed)
 [facility] Counselor/Therapist Progress Note  Patient ID: RAYVEN RETTIG, MRN: [id number],    Date: 09/17/2023  Time Spent: 50 minutes   Treatment Type: Individual Therapy  Reported Symptoms: anxiety, depression, frustration, irritability but not as bad, motivation up and down, Grandfather who had been in palliative care did die this past week and funeral to be October 10, 2023    Mental Status Exam:  Appearance:   Casual     Behavior:  Appropriate and Sharing  Motor:  Normal  Speech/Language:   Clear and Coherent  Affect:  Depressed and anxious  Mood:  anxious and depressed  Thought process:  goal directed  Thought content:    Rumination  Sensory/Perceptual disturbances:    WNL  Orientation:  oriented to person, place, time/date, situation, day of week, month of year, year, and stated date of Feb. 6, 2025  Attention:  Good  Concentration:  Good and Fair  Memory:  WNL  Fund of knowledge:   Good  Insight:    Good and Fair  Judgment:   Good  Impulse Control:  Good   Risk Assessment: Danger to Self:  No Self-injurious Behavior: No Danger to Others: No Duty to Warn:no Physical Aggression / Violence:No  Access to Firearms a concern: No  Gang Involvement:No   Subjective:  Patient in session today continuing to work on her obsessive thoughts/ruminating, anxiety, frustrations, some depression, and irritability.  Denies any SI. Grandfather did die since patient's last appt and she needed part of session today to process some grief and family circumstances that have become more obvious.  Some tearfulness.  Does continue to struggle with some family dynamics and what feels like judgement and reports this feeds her symptoms of anxiety and depression and some sadness, which she needed session to process today and did quite well in realizing areas where she can improve her follow through which can help her not experience as much overwhelmedness.  More intentional focus on  some self-care issues especially as some family tensions have risen some lately and patient feeling somewhat cornered.  Also looked at ways of communicating especially with parents that might be more helpful for her and how she might feel more understood.  Reframing some of the ways she views certain situations and looking at how she might consider some changes that will contribute to her feeling more understood and content.  Job issues and applications remain a working process.  Continues to work on not postponing certain tasks that need to be completed within a specific timeframe.  Encouraged patient and her willingness and being open to trying new behaviors as discussed in sessions, new ways of looking at things, and asking for help when she needs it.  Interventions: Cognitive Behavioral Therapy and Ego-Supportive  Long term goal: Stabilize anxiety level while increasing ability to function on a daily basis, as Evidenced by patient being able to function daily without feeling her anxiety level impedes her functioning level.[name] term goal: Increase understanding of beliefs and messages that produce worry and amxiety, as Evidenced by patient clearly stating her improved understanding of negative/anxious thought patterns and how they affect how they lead to worry and anxiety. Strategies: 1)Help patient develop reality-based, positive cognitive messages. 2)Increase daily use of positive self-talk  3) refrain from assuming worst-case scenarios  Diagnosis:   ICD-10-CM   1. Generalized anxiety disorder  F41.1      Plan:  Patient today actively involved in session working more on her  goals, strategies to meet those goals, her communication particularly with parents, her anxiety, having healthy boundaries, trying to be more motivated and not postponing tasks, able to accept suggestions from others in the family and understand that they are merely suggestions and not requirements, working to be more  motivated and not postponing tasks, see more options for herself and consider input from others that care about her, while focusing more on task completion versus starting and stopping multiple times and not seeming to finish a task).  Continues working on improving self-esteem and outlook, healthier boundaries, and less self judging and self negating. Encouraged patient in her practice of more positive and self affirming behaviors outside of sessions in her daily life including: Refrain from procrastinating on things that she needs to handle now, look for more positives versus negatives daily, recognize and emphasize her strengths rather than perceived weaknesses, refrain from assuming worst-case scenarios, stay in contact with supportive people, challenge and counteract her self-doubt, avoid making assumptions about what others may be thinking and instead ask them, and realize the strength she shows working with goal-directed behaviors as she moves in a direction that supports improved emotional health and her overall outlook into the future.  Patient has shown gradual progress and she needs to continue working with goal-directed behaviors to keep moving in a more healthy and hopeful direction.  Goal review and progress/challenges noted with patient.  Next appointment within 2 weeks.   [name], LCSW

## 2023-09-24 ENCOUNTER — Ambulatory Visit: Payer: 59 | Admitting: Psychiatry

## 2023-09-24 DIAGNOSIS — F411 Generalized anxiety disorder: Secondary | ICD-10-CM | POA: Diagnosis not present

## 2023-09-24 NOTE — Progress Notes (Signed)
[facility] Counselor/Therapist Progress Note  Patient ID: Vanessa Doyle, MRN: [id number],    Date: 09/24/2023  Time Spent: 58 minutes   Treatment Type: Individual Therapy  Reported Symptoms: anxiety, depression, some irritability, frustration, motivation challenged at times   Mental Status Exam:  Appearance:   Casual     Behavior:  Appropriate, Sharing, and Motivated  Motor:  Normal  Speech/Language:   Clear and Coherent  Affect:  Anxious, depressed  Mood:  anxious and depressed  Thought process:  goal directed  Thought content:    Rumination  Sensory/Perceptual disturbances:    WNL  Orientation:  oriented to person, place, time/date, situation, day of week, month of year, year, and stated date of Feb. 13, 2025  Attention:  Fair  Concentration:  Fair  Memory:  WNL  Fund of knowledge:   Good  Insight:    Good and Fair  Judgment:   Good and Fair  Impulse Control:  Fair   Risk Assessment: Danger to Self:  No Self-injurious Behavior: No Danger to Others: No Duty to Warn:no Physical Aggression / Violence:No  Access to Firearms a concern: No  Gang Involvement:No   Subjective:   Patient today reports her continued focus on trying to decrease and better manage frustrations, irritability, obsessive thoughts/ruminating, anxiety, and some depression.  Feels she is managing her grief over the recent death of her grandfather, and healthier ways, which we talked about some in session today. Grandfather's funeral to be held on October 10, 2023 and feels she is managing her grief rather well. Reports relationships within family still involves struggle and some hurt, patient often internalizes "some things in ways others may not have intended" and has difficulty recognizing/accepting. Motivation has been challenging at times "but am motivated." "Keeping a schedule daily is helping me with motivation and follow through but working to be able to be more flexible at times and less  perfectionistic. "Need to be better about focusing more on the things I can change" and the positives that need attention also. Family dynamics continue to offer challenges and patient able to shared more of this today involving her immediate family. Trying to better "manage what to her, feels like negative judgement, and being more aware that sometimes her assumptions may not be accurate and that it is okay to check out with people what they "may have meant in their comments".  Continued work on reframing some of the ways she views certain situations and some input from others.  Trying to do less "postponing of certain tasks that need to be completed within a certain time".  Realizing that some of her habits as far as time management and task completion have been going on since "I was a child".  Overly thinks in order to "get it right".  Tends to feel cornered when someone within family mentions anything about task completion.  Still living in the home with her parents currently as she has not gotten a full-time job yet but has made applications for jobs.  Continues to work on being more proactive versus reactive.  Encouraged patient in some increased insight and her trying to have more willingness to practice new behaviors that might be more helpful to her and letting go of some old behaviors that have proven to not be helpful.  Interventions: Cognitive Behavioral Therapy and Ego-Supportive  Long term goal: Stabilize anxiety level while increasing ability to function on a daily basis, as Evidenced by patient being able to function  daily without feeling her anxiety level impedes her functioning level.[name] term goal: Increase understanding of beliefs and messages that produce worry and amxiety, as Evidenced by patient clearly stating her improved understanding of negative/anxious thought patterns and how they affect how they lead to worry and anxiety. Strategies: 1)Help patient develop reality-based,  positive cognitive messages. 2)Increase daily use of positive self-talk  3) refrain from assuming worst-case scenarios  Diagnosis:   ICD-10-CM   1. Generalized anxiety disorder  F41.1      Plan:  Patient today continues working on her goals especially regarding some motivational issues, communication within the family, healthy boundaries, better managing her anxiety, improved relationships within the family and being able to understand when family members are trying to be helpful and not critical, improving her motivation, showing more proactivity and situations especially at home, working to not misinterpret communication with other people and not naturally assume they meant something negative, and more intentional focus on completing tasks in a timely manner versus getting overly involved "in my head" and putting things off.  Review of some of her progress towards end of session and patient responded well noting some of her improvements without discounting them, and trying to decrease her self negating and self judgment, while continuing to work on her self-esteem and healthy boundaries especially within her family. Encouraged patient in practicing continued positive and self affirming behaviors outside of sessions in her daily life and work including: Acupuncturist on things that she needs to handle now, look for more positives versus negatives each day, recognize and emphasize her strengths rather than perceived weaknesses, refrain from assuming worst-case scenarios, stay in contact with supportive people, challenge and counteract her self-doubt, avoid making assumptions about what others may be thinking and instead asked them, and recognize the strengths she shows working with goal-directed behaviors as she moves in a direction that supports improved emotional health and her overall wellbeing.  Patient has definitely shown gradual progress and she needs to continue working with goal-directed  behaviors to keep moving in a healthier and more hopeful direction.  Goal review and progress/challenges noted with patient.  Next appointment within 2 weeks.   [name], LCSW

## 2023-09-28 ENCOUNTER — Ambulatory Visit: Payer: 59 | Admitting: Psychiatry

## 2023-09-28 DIAGNOSIS — F411 Generalized anxiety disorder: Secondary | ICD-10-CM | POA: Diagnosis not present

## 2023-09-28 NOTE — Progress Notes (Signed)
 [facility] Counselor/Therapist Progress Note  Patient ID: Vanessa Doyle, MRN: [id number],    Date: 09/28/2023  Time Spent: 50 minutes   Treatment Type: Individual Therapy  Reported Symptoms: anxiety, depression some better, irritability decreasing some, frustration, some motivational challenges but "improving"   Mental Status Exam:  Appearance:   Casual     Behavior:  Appropriate, Sharing, and some motivational improvement  Motor:  Normal  Speech/Language:   Clear and Coherent  Affect:  Depressed and anxious  Mood:  anxious and depressed  Thought process:  goal directed  Thought content:    Decreased obsessive thoughts  Sensory/Perceptual disturbances:    WNL  Orientation:  oriented to person, place, time/date, situation, day of week, month of year, year, and stated date of Feb. 17, 2025  Attention:  Good  Concentration:  Good  Memory:  WNL  Fund of knowledge:   Good  Insight:    Good  Judgment:   Good  Impulse Control:  Good   Risk Assessment: Danger to Self:  No Self-injurious Behavior: No Danger to Others: No Duty to Warn:no Physical Aggression / Violence:No  Access to Firearms a concern: No  Gang Involvement:No   Subjective:   Patient in session today reporting symptoms of irritability, frustration, obsessive thoughts/ruminating, anxiety, and some depression.  Does feel that she has and is making some very gradual progress.  Grief over the recent death of her grandfather has improved.  She reports his funeral is to be held on October 10, 2023.  Family relationships continue to be a challenge for her at times and easy for her to misinterpret some behaviors of others and possibly internalize some things in ways that others did not intend. Motivation challenged but feeling some increase recently. Brother and wife came this weekend to visit. Increased irritability as "I didn't know they were coming to visit." Trying to keep more of a schedule each day as she felt that  would help her motivation but hasn't been able to start with a schedule. Lots of tedious over-scheduling herself feeling she will get more done. Continues today working on "saying no" as needed or wanted. Focusing more of "follow through" with goals, and being more motivated in that follow through. Is trying to "keep a daily schedule each day, but without being so obsessive about it that the scheduling self becomes such a burden that it feeds her anxiety and takes away from her being able to accomplish tasks.  This is a pattern she has had over the years and is trying to break, however is difficult.  Trying to take more manageable steps rather than "all at once".  Continues work on trying to realize more of the "things I cannot change or control" versus what I can.  Hard for patient to put this "into action".  Worked with a couple specific examples today.  Continued work on negative self judgment and realizing that her assumptions are not always accurate.  Procrastination remains an issue and the "planning ahead and scheduling" seems to feed into the procrastination, versus task completion" as discussed today.  Comments further today about how some of her time management and scheduling of tasks have been an issue for her "ever since I was a child".  No further news yet as far as her job hunting.  Continue to work with patient on increased insight and practice of new behaviors that could be helpful to her in being more productive on what she wants to accomplish.  Interventions: Cognitive Behavioral Therapy and Ego-Supportive  Long term goal: Stabilize anxiety level while increasing ability to function on a daily basis, as Evidenced by patient being able to function daily without feeling her anxiety level impedes her functioning level.[name] term goal: Increase understanding of beliefs and messages that produce worry and amxiety, as Evidenced by patient clearly stating her improved understanding of  negative/anxious thought patterns and how they affect how they lead to worry and anxiety. Strategies: 1)Help patient develop reality-based, positive cognitive messages. 2)Increase daily use of positive self-talk  3) refrain from assuming worst-case scenarios Diagnosis:   ICD-10-CM   1. Generalized anxiety disorder  F41.1      Plan:   Patient in session today and continues working on goals to address her communication issues within the family, motivational issues, healthy boundaries, follow-through on her goals, better managing anxiety, improve relationships within the family and being able to understand when family members are trying to be helpful and not critical, showing more proactivity in situations especially at home, working to not misinterpret communication with other people and not naturally assume they meant something negative, and more intentional focus on completing task in a timely manner versus getting overwhelmed and involved "in my head" and putting things off.  Towards end of session we reviewed progress again and patient able to see some small gains but also ways that she unintentionally gets in her own way of following through on goal-directed behaviors.  Continues trying to decrease self negating and self judgment and improve her self-esteem, along with healthy boundaries especially within the family.  **Hard to recognize the reciprocal relationship some of her thoughts and behaviors have especially in control issues, motivation to do something different, and making change.  Trying to take more control of her decisions in a positive way and be more proactive, and not be sidelined by things that can get her attention and she get distracted.  Encouraged patient to keep practicing positive and self affirming behaviors outside of sessions in her daily life and work including: Refrain from procrastinating on things she needs to handle now, look for more positives versus negatives each day,  recognize and emphasize her strengths, do not focused on perceived weaknesses, refrain from assuming worst-case scenarios, stay in contact with supportive people, challenging counteract her self-doubt, avoid making assumptions about what others may be thinking and instead asked them, and realize the strength she shows working with goal-directed behaviors as she moves in a direction that supports improved emotional health and her overall wellbeing. Patient has definitely made some gradual progress and is motivated to continue working with goal-directed behaviors to keep moving in a healthier and more hopeful direction.  Goal review and progress/challenges noted with patient.  Next appointment within 2 weeks.   [name], LCSW

## 2023-10-08 ENCOUNTER — Ambulatory Visit (INDEPENDENT_AMBULATORY_CARE_PROVIDER_SITE_OTHER): Payer: 59 | Admitting: Psychiatry

## 2023-10-08 DIAGNOSIS — F411 Generalized anxiety disorder: Secondary | ICD-10-CM | POA: Diagnosis not present

## 2023-10-08 NOTE — Progress Notes (Signed)
 [facility] Counselor/Therapist Progress Note  Patient ID: Vanessa Doyle, MRN: [id number],    Date: 10/08/2023  Time Spent: 50 minutes   Treatment Type: Individual Therapy  Reported Symptoms: anxiety,irritability, depression improving, frustration, motivational challenges improving    Mental Status Exam:  Appearance:   Casual     Behavior:  Appropriate, Sharing, and Motivated  Motor:  Normal  Speech/Language:   Clear and Coherent  Affect:  Anxious, some depression  Mood:  anxious  Thought process:  goal directed  Thought content:    Rumination  Sensory/Perceptual disturbances:    WNL  Orientation:  oriented to person, place, time/date, situation, day of week, month of year, year, and stated date of Feb. 27, 2025  Attention:  Good  Concentration:  Good and Fair  Memory:  WNL  Fund of knowledge:   Good  Insight:    Good and Fair  Judgment:   Good and Fair  Impulse Control:  Good   Risk Assessment: Danger to Self:  No Self-injurious Behavior: No Danger to Others: No Duty to Warn:no Physical Aggression / Violence:No  Access to Firearms a concern: No  Gang Involvement:No   Subjective: Patient today reporting anxiety, some depression, improved irritability, frustration, obsessive thought/ruminating but not as negative. Recognizing progress.  Continues grief work re: grandfather's death and feels having the funeral this weekend will help family (including patient) to move forward more. Continued work on family-related issues which are a challenge and patient is showing some progress in relationship with her parents with whom she continues to live with for now. Still having some misinterpretation in the behaviors of others. Working to stop making assumptions that can often be inaccurate. Also working to better manage job-related stress with upcoming furniture market and needed some session time today to process better management and better communication with management at  work [employer]) and the unexpected issues arising in that environment. 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 Still easy to misinterpret other people or their intentions, and reports that she is working on catching herself in this and trying to not "overthink" ahead which she feels would help.  Working on not internalizing in certain situations particularly related to work or family.  Feeling a little more motivated.  Reports that she continues to try and keep a daily schedule that she feels can help her motivation and anxiety but finding it difficult at times to keep it up.  Recognizing and trying to interrupt more at the overthinking and over scheduling.  Paying more attention to "the things I can change or control and the things that I cannot change her control".  This is difficult for patient.  Working on decreasing her negative self judgment.  Feels that her procrastination has not been as significant since last appointment, and trying to focus more on better time management and better expectations as she schedules certain tasks.  Continued work on development of increased insight and new or behaviors that could help her and being more productive and better follow-through on tasks.   Interventions: Cognitive Behavioral Therapy and Ego-Supportive  Long term goal: Stabilize anxiety level while increasing ability to function on a daily basis, as Evidenced by patient being able to function daily without feeling her anxiety level impedes her functioning level.[name] term goal: Increase understanding of beliefs and messages that  produce worry and amxiety, as Evidenced by patient clearly stating her improved understanding of negative/anxious thought patterns and how they affect how they lead to worry and anxiety. Strategies: 1)Help patient  develop reality-based, positive cognitive messages. 2)Increase daily use of positive self-talk  3) refrain from assuming worst-case scenarios  Diagnosis:   ICD-10-CM   1. Generalized anxiety disorder  F41.1      Plan:    Patient today continues some good work on her goals particularly relating to family (with whom she lives) communication and interaction issues, more emphasis on motivational issues, following through on her goals, healthy boundaries, better managing anxiety, improve relationships within the family and her being able to understand and family members are trying to be helpful and not critical, showing more proactivity and situations especially at home, and putting more intentional focus on task completion in a timely manner versus getting overwhelmed and involved "in my head" and ends up and self sabotaging behavior.  Working harder to not misinterpret communication with other people and naturally assume they meant something negative towards her, and instead be willing to ask the person when this happens.  Making some gains on her goals and in her self-esteem which is challenging for her.  Trying to keep her motivation up and being more proactive in her decision making and managing potential distractions better. Reminded and encouraged patient to keep practicing positive and self affirming behaviors outside of sessions in her daily life and work including: Focus and more on her procrastination on tasks that she needs to handle, look for more positives versus negatives each day, recognize and emphasize her strengths, do not focus on perceived weaknesses, refrain from assuming worst-case scenarios, stay in contact with supportive people, challenge and counteract her self-doubt, avoid making assumptions about what others may be thinking and instead ask them, and realize the strengths she shows working with goal-directed behaviors as she moves in a direction that supports improved emotional health  and her overall wellbeing.  Patient has made gradual progress and is motivated and needs to continue working with goal-directed behaviors to help her move in a healthier and more hopeful direction.  Goal review and progress/challenges noted with patient.  Next appointment within 2 weeks.   [name], LCSW

## 2023-10-22 ENCOUNTER — Ambulatory Visit: Payer: 59 | Admitting: Psychiatry

## 2023-10-22 DIAGNOSIS — F411 Generalized anxiety disorder: Secondary | ICD-10-CM | POA: Diagnosis not present

## 2023-10-22 NOTE — Progress Notes (Signed)
 [facility] Counselor/Therapist Progress Note  Patient ID: Vanessa Doyle, MRN: [id number],    Date: 10/22/2023  Time Spent: 55 minutes   Treatment Type: Individual Therapy  Reported Symptoms: anxiety, depression, frustration, some sadness    Mental Status Exam:  Appearance:   Casual     Behavior:  Appropriate, Sharing, and Motivated  Motor:  Normal  Speech/Language:   Clear and Coherent  Affect:  anxious  Mood:  anxious and some depression  Thought process:  goal directed  Thought content:    Some obsessive thoughts  Sensory/Perceptual disturbances:    WNL  Orientation:  oriented to person, place, time/date, situation, day of week, month of year, year, and stated date of October 22, 2023  Attention:  Good  Concentration:  Good  Memory:  WNL  Fund of knowledge:   Good  Insight:    Good and Fair  Judgment:   Good and Fair  Impulse Control:  Good   Risk Assessment: Danger to Self:  No Self-injurious Behavior: No Danger to Others: No Duty to Warn:no Physical Aggression / Violence:No  Access to Firearms a concern: No  Gang Involvement:No   Subjective:    Patient today in session and working further on her depression, anxiety, sadness, frustration, obsessive thoughts, ruminating, and also recognizing some of her progress!.  Irritability and motivation are somewhat improved.  Occasional misinterpretation in the behaviors of others towards her.  Trying to interrupt inaccurate assumptions that are negative about herself.  Working today mostly on her depression and anxiety.  Trying not to overthink new or internalize things that can happen in situations particularly as it relates to her family.  Feels she is also working through more of her grief  re: grandfather's recent death. Depression related to her not having a job "in my field and feeling stuck in a loop".  Tends to see this as a failure or not measuring up although later states "I know it's not true that I'm a failure,  but it just feels that way sometimes."  Tearfully shared some issues in past and how she views some of her future, tending to lean in a more sad direction and referring to her "not being as far along in her career and other aspects of life where she feels less than others her age." Processed this at length in session today with patient and she was able to share some emotions related to earlier in life from about 23 yrs old and going forward. Did well in speaking out and expressing/managing some sensitive emotions related to her self-esteem and negatively comparing herself to others especially her peers.   Before session and, reviewed with patient again some of her work the previous session about paying more attention to "the things she can change or control versus the things she cannot change or control".  Will work further on this and the issues we talked about regarding comparing herself negatively to her peers, and patient is to do some written homework to follow-up on this and bring in with her next session within the next 1-2 weeks.   Interventions: Cognitive Behavioral Therapy and Ego-Supportive  Long term goal: Stabilize anxiety level while increasing ability to function on a daily basis, as Evidenced by patient being able to function daily without feeling her anxiety level impedes her functioning level.[name] term goal: Increase understanding of beliefs and messages that produce worry and amxiety, as Evidenced by patient clearly stating her improved understanding of negative/anxious  thought patterns and how they affect how they lead to worry and anxiety. Strategies: 1)Help patient develop reality-based, positive cognitive messages. 2)Increase daily use of positive self-talk  3) refrain from assuming worst-case scenarios  Diagnosis:   ICD-10-CM   1. Generalized anxiety disorder  F41.1      Plan:    Patient in session today and continuing to work on her goals relating to  familyrelationships/communication/interaction issues, working further on her motivation and trying to keep it more consistently at a positive level, following through on her goals, use of healthy boundaries, better managing her anxiety, improving relationships within her family and being able to understand that family members are trying to be helpful and not critical, showing more proactivity in situations especially at home, putting more intentional focus on task completion in a timely manner versus getting overwhelmed and "in my head" which ends up being self sabotaging behavior.  Encouraged her to also continue her work on not misinterpreting communication with other people and making automatic assumptions that they meant something negative towards her, and instead be willing to check it out with the person with which she is involved when this happens.  Patient is making progress on both her goals and in self-esteem both of which can be challenging for her.  Working well on trying to keep her motivation up and being more proactive in decision making and managing potential distractions better, as she is starting to recognize some of these problem areas sooner which is progress for her. Encouraged patient for her to keep practicing positive and self affirming behaviors outside of sessions in her daily life and work including: Recognizing and emphasizing her strengths, focus more on her procrastination of task that she needs to handle, look for more positives versus negatives each day, do not focus on perceived weaknesses, refrain from assuming worst-case scenarios, stay in contact with supportive people, challenging counteract her self-doubt, avoid making assumptions about what others may be thinking and instead asked them, and recognize the strengths she shows working with goal-directed behaviors as she moves in a direction that supports improved emotional health and her outlook into the future.  This patient has  and continues to make gradual progress as she works to remain motivated and needs to continue working with goal-directed behaviors to keep moving and a more hopeful and healthier direction overall.  Goal review and progress/challenges noted with patient.  Next appointment within 2 weeks.   [name], LCSW

## 2023-10-26 ENCOUNTER — Ambulatory Visit (INDEPENDENT_AMBULATORY_CARE_PROVIDER_SITE_OTHER): Payer: 59 | Admitting: Psychiatry

## 2023-10-26 DIAGNOSIS — F411 Generalized anxiety disorder: Secondary | ICD-10-CM

## 2023-10-26 NOTE — Progress Notes (Signed)
 [facility] Counselor/Therapist Progress Note  Patient ID: Vanessa Doyle, MRN: [id number],    Date: 10/26/2023  Time Spent: 50 minutes   Treatment Type: Individual Therapy  Reported Symptoms: anxiety, depression, frustration, some sadness    Mental Status Exam:  Appearance:   Casual     Behavior:  Appropriate, Sharing, and Motivated  Motor:  Normal  Speech/Language:   Clear and Coherent  Affect:  Depressed and anxious  Mood:  anxious and depressed  Thought process:  goal directed  Thought content:    Some obsessive thoughts  Sensory/Perceptual disturbances:    WNL  Orientation:  oriented to person, place, time/date, situation, day of week, month of year, year, and stated date of October 26, 2023  Attention:  Good  Concentration:  Good  Memory:  WNL  Fund of knowledge:   Good  Insight:    Good and Fair  Judgment:   Good  Impulse Control:  Good and Fair   Risk Assessment: Danger to Self:  No Self-injurious Behavior: No Danger to Others: No Duty to Warn:no Physical Aggression / Violence:No  Access to Firearms a concern: No  Gang Involvement:No   Subjective:   Patient today in session continuing her work on anxiety, depression, some sadness, obsessive thoughts, ruminating, frustrations, but also encouraged her to see even small points of progress.  Per her report, her motivation and irritability have improved some and patient sensing this more recently. Some stress with brother and his wife, wife's parents and patient's parents,  and patient explaining some of her discomfort in that environment. Feels "out of place with them some" and explained this more today, and fits in with some of patient's other feelings of "not fitting-in with some groups of people." Continues her work today on certain relationships especially family, and some of the misinterpretations that occur with certain behaviors. "Fitting in" can be a challenge "in overall life but especially with family and  job" as patient processed this more in session today. "I don't feel like others see me as an adult, because I don't have traditional 9-5 job and I still live at home with parents". Further work today on some of her negative thoughts that feed her anxiety, depression, some sadness, frustration, and obsessive thoughts. Working to decrease her overthinking and trying to interrupt this overthinking that she too feels contributes to her mood (as it feeds the overwhelmedness but hard to stop). RE: grandfather's death- patient feels she is managing her grief ok and trying to move forward.Wants to start getting up earlier and her goal is to start with one day (Sunday) and then increase. Being more conscientious about her goals but has some history of changes being difficult which we included some discussion about in session today.  Again emphasizing with her paying attention to the things she can change or control versus what she cannot change her control.  Interventions: Cognitive Behavioral Therapy and Ego-Supportive  Long term goal: Stabilize anxiety level while increasing ability to function on a daily basis, as Evidenced by patient being able to function daily without feeling her anxiety level impedes her functioning level.[name] term goal: Increase understanding of beliefs and messages that produce worry and amxiety, as Evidenced by patient clearly stating her improved understanding of negative/anxious thought patterns and how they affect how they lead to worry and anxiety. Strategies: 1)Help patient develop reality-based, positive cognitive messages. 2)Increase daily use of positive self-talk  3) refrain from assuming worst-case scenarios  Diagnosis:  ICD-10-CM   1. Generalized anxiety disorder  F41.1      Plan:  Patient today continuing her work on her goals as we reviewed again in session, especially related to family communication/interaction/relationships/, following through on her goals, use  of healthy boundaries, better managing her anxiety, improving relationships within her family and being able to understand that family members are trying to be helpful at times and not critical, showing more proactivity and situations especially at home, putting more intentional focus on task completion in a timely manner versus getting overwhelmed and "in my head" which ends up being very self sabotaging, and continued work on motivation to get it up to a more positive level.  Continued focus on not making automatic assumptions in her conversations with other people including family, and instead be willing to check out with the other person if she heard them correctly and what their intention was.  Reminded and encouraged patient to keep practicing positive and self affirming behaviors outside of sessions in her daily life and work including: Focus more on her procrastination of tasks that she needs to handle, look for more positives versus negatives daily, do not focus on her perceived weaknesses, refrain from assuming worst-case scenarios, recognize and emphasize her strengths, staying in contact with supportive people, challenge and counteract self-doubt, avoid making assumptions about what others may be thinking and instead asked them, and realize the strength she shows working with goal-directed behaviors as she moves in a direction that supports improved emotional health and her overall wellbeing.  Patient has made and continues to make progress as she works to remain motivated and needs to continue working with goal-directed behaviors to keep moving in a more hopeful and overall healthier direction.  Goal review and progress/challenges noted with patient.  Next appointment within 2 weeks.   [name], LCSW

## 2023-11-02 ENCOUNTER — Ambulatory Visit (INDEPENDENT_AMBULATORY_CARE_PROVIDER_SITE_OTHER): Admitting: Psychiatry

## 2023-11-02 DIAGNOSIS — F411 Generalized anxiety disorder: Secondary | ICD-10-CM | POA: Diagnosis not present

## 2023-11-02 NOTE — Progress Notes (Signed)
 [facility] Counselor/Therapist Progress Note  Patient ID: Vanessa Doyle, MRN: [id number],    Date: 11/02/2023  Time Spent: 50 minutes   Treatment Type: Individual Therapy  Reported Symptoms: anxiety,depression, frustration, some sadness   Mental Status Exam:  Appearance:   Neat and Well Groomed     Behavior:  Appropriate, Sharing, and Motivated  Motor:  Normal  Speech/Language:   Clear and Coherent  Affect:  Anxious, some depression  Mood:  anxious and depressed  Thought process:  goal directed  Thought content:    Rumination  Sensory/Perceptual disturbances:    WNL  Orientation:  oriented to person, place, time/date, situation, day of week, month of year, year, and stated date of November 02, 2023  Attention:  Good  Concentration:  Good  Memory:  "Pretty good but sometimes forgets words when talking"  Fund of knowledge:   Good  Insight:    Good  Judgment:   Good  Impulse Control:  Good   Risk Assessment: Danger to Self:  No Self-injurious Behavior: No Danger to Others: No Duty to Warn:no Physical Aggression / Violence:No  Access to Firearms a concern: No  Gang Involvement:No   Subjective: Patient in session today working further on anxiety, sadness, depression, obsessive thoughts, ruminating, and frustrations.  States she continues to see some progress which encourages her and wanting to continue talking further today about some job-related issues and "I want to be somewhere different in my life eventually." Hasn't felt like she's been following through as much as she should, and has had some emotional challenges. Some tearfulness and follows up by talking more about her stress particularly with trying to get a job in her field and not succeeding yet. Also worked on some personal growth issues as it related to still living at home with parents. Differences in political views with other family members, has increased the anxiety at home more. Some improvement in  motivation and speaking up in session today which has been good for patient as she seems to be showing more confidence in herself. The feeling of "fitting in" remains a challenge for her and we are continuing to work with her on this.  Still has a feeling that "others do not see me as an adult in life" and talks through this a little more today in session.  Is aware that her negative thinking does feed her anxiety, depression, sadness, and obsessive thoughts.  Is working to confront her negative thoughts about herself and tried to look at herself with more love and acceptance for the person that she is, even if there are changes she would like to make would like to grow more in certain directions from where she is at now.  Remains self-conscious about her goals and takes them seriously although working on being patient with herself and understanding when she does not do things quite as quickly as she had hoped.  Interventions: Cognitive Behavioral Therapy and Ego-Supportive  Long term goal: Stabilize anxiety level while increasing ability to function on a daily basis, as Evidenced by patient being able to function daily without feeling her anxiety level impedes her functioning level.[name] term goal: Increase understanding of beliefs and messages that produce worry and amxiety, as Evidenced by patient clearly stating her improved understanding of negative/anxious thought patterns and how they affect how they lead to worry and anxiety. Strategies: 1)Help patient develop reality-based, positive cognitive messages. 2)Increase daily use of positive self-talk  3) refrain from assuming  worst-case scenarios  Diagnosis:   ICD-10-CM   1. Generalized anxiety disorder  F41.1      Plan:   Patient in session today working further on family interactions/relationships/communication, her personal goals, healthy boundaries, management of anxiety, improving relationships within her family and being able to  understand that family members are trying to be helpful at times and not critical, showing more proactivity in situations at home especially, putting more intentional focus on task completion in a timely manner versus getting overwhelmed and "in my head" which ends up being self sabotaging.  She continues to work on motivation.  Trying to not make automatic assumptions and conversations with other people including family and instead ask questions for clarification. Reminded and encouraged patient to keep practicing positive and self affirming behaviors outside of sessions in her daily life and work including: Focus more on her procrastination in performing tasks that she needs to handle, look for more positives versus negatives daily, do not focus on her perceived weaknesses and instead focus on her strengths, refrain from assuming worst-case scenarios, stay in contact with supportive people, challenging counteract self-doubt, avoid making assumptions about what others may be thinking and instead ask them, and recognize the strength she shows working with goal-directed behaviors moving in a direction that supports improved emotional health and her overall wellbeing.  This patient has definitely made some progress and continues to be motivated and working on her goal-directed behaviors to keep moving in a more hopeful and overall healthier direction.  Goal review and progress/challenges noted with patient.  Next appointment within 2 weeks.   [name], LCSW

## 2023-11-17 ENCOUNTER — Ambulatory Visit: Admitting: Psychiatry

## 2023-11-17 DIAGNOSIS — F411 Generalized anxiety disorder: Secondary | ICD-10-CM | POA: Diagnosis not present

## 2023-11-17 NOTE — Progress Notes (Signed)
 [facility] Counselor/Therapist Progress Note  Patient ID: Vanessa Doyle, MRN: [id number],    Date: 11/17/2023  Time Spent: 55 minutes   Treatment Type: Individual Therapy  Reported Symptoms: anxiety, depression, frustration, sadness decreased, obsessive thoughts    Mental Status Exam:  Appearance:   Casual and Neat     Behavior:  Appropriate, Sharing, and Motivated  Motor:  Normal  Speech/Language:   Clear and Coherent  Affect:  anxious  Mood:  anxious and some depression  Thought process:  goal directed  Thought content:    Rumination  Sensory/Perceptual disturbances:    WNL  Orientation:  oriented to person, place, time/date, situation, day of week, month of year, year, and stated date of November 17, 2023  Attention:  Good  Concentration:  Good and Fair  Memory:  WNL  Fund of knowledge:   Good  Insight:    Good and Fair  Judgment:   Good  Impulse Control:  Good and Fair   Risk Assessment: Danger to Self:  No Self-injurious Behavior: No Danger to Others: No Duty to Warn:no Physical Aggression / Violence:No  Access to Firearms a concern: No  Gang Involvement:No   Subjective:    Patient in for session today and continues to work on her anxiety, depression, obsessive thoughts, ruminating, some sadness, and frustrations. Reports thinking about "Why am I so triggered by sister-in-law's pregnancy?" Shares some family history and some more details with immediate family that is very stressful for patient currently, and needed session today to talk through her sense of pressure, and "feeling numb to my feelings". "Want things in my life to be different and I am better understanding that in order for things to be different I have to invest in it and do whatever I can to help myself. Talks of desire to considering more strategies in finding a job and perhaps re-locating to different location. Stress re: "being single" and processing this more today as she senses pressure from  other people that are close to the family.  Tends to personalize issues more under stress and some of that she shared today in session which seemed helpful to her.  Needs to work on some emotional challenges and showing some progress and also more "speaking up for herself".  Motivation improved some.  Still working with "the feeling of fitting in more with other people or groups".  Working harder on her negative thinking as she realizes it is very much connected to her anxiety, obsessive thoughts, and depression.  Relationship issues within family and extended family process more in session today.  (Not all details included in this note due to patient privacy needs.)  Interventions: Cognitive Behavioral Therapy and Ego-Supportive  Long term goal: Stabilize anxiety level while increasing ability to function on a daily basis, as Evidenced by patient being able to function daily without feeling her anxiety level impedes her functioning level.Vanessa Doyle term goal: Increase understanding of beliefs and messages that produce worry and amxiety, as Evidenced by patient clearly stating her improved understanding of negative/anxious thought patterns and how they affect how they lead to worry and anxiety. Strategies: 1)Help patient develop reality-based, positive cognitive messages. 2)Increase daily use of positive self-talk  3) refrain from assuming worst-case scenarios    Diagnosis:   ICD-10-CM   1. Generalized anxiety disorder  F41.1      Plan:   Patient today in session focusing more on her personal and family issues that often intersect with each  other, and involve family interactions/relationships/communication and patient's overall personal goals.  Also relates to her having healthier boundaries and management of her anxiety, while trying to improve relationships within her family and be able to understand that sometimes she may misunderstand their words or actions and take it critically when they are  actually intending to be helpful.  Continue to work on more regular motivation.  Also working to refrain from automatic assumptions about others.  More actively involved and seemed to feel more positive about herself in session today.  Reminded and encouraged patient to practice positive and self affirming behaviors outside of sessions in her daily life and work including: Not focusing on her perceived weaknesses and instead focus on her strengths including those that she is working on forming, focus more on her procrastination and performing tasks that she needs to handle, look for more positives versus negatives daily, refrain from assuming worst case scenarios, stay in contact with supportive people, challenge and counteract self-doubt, avoid making assumptions about what others may be thinking and instead asked them, and realizes strength she shows working with goal-directed behaviors moving in a direction that supports her improved emotional health and her outlook into the future.  Vanessa Doyle has made some progress on her goals and continues to be motivated and focused on her goal-directed behaviors to keep moving and a more hopeful and overall healthier direction.  Goal review and progress/challenges noted with patient.  Next appointment within 2 weeks.   Vanessa Fare, LCSW

## 2023-11-25 ENCOUNTER — Ambulatory Visit: Admitting: Psychiatry

## 2023-11-26 ENCOUNTER — Ambulatory Visit: Admitting: Psychiatry

## 2023-11-26 DIAGNOSIS — F411 Generalized anxiety disorder: Secondary | ICD-10-CM

## 2023-11-26 NOTE — Progress Notes (Signed)
 [facility] Counselor/Therapist Progress Note  Patient ID: Vanessa Doyle, MRN: [id number],    Date: 11/26/2023  Time Spent: 53 minutes   Treatment Type: Individual Therapy  Reported Symptoms: anxiety,depression improving, frustration, sadness re: overwhelmed, obsessive thought improved   Mental Status Exam:  Appearance:   Casual     Behavior:  Appropriate, Sharing, and Motivated  Motor:  Normal  Speech/Language:   Clear and Coherent  Affect:  Anxiety, some depression  Mood:  anxious and depressed  Thought process:  goal directed  Thought content:    WNL  Sensory/Perceptual disturbances:    WNL  Orientation:  oriented to person, place, time/date, situation, day of week, month of year, year, and stated date of November 26, 2023  Attention:  Fair  Concentration:  Good  Memory:  WNL  Fund of knowledge:   Good  Insight:    Good and Fair  Judgment:   Good  Impulse Control:  Good   Risk Assessment: Danger to Self:  No Self-injurious Behavior: No Danger to Others: No Duty to Warn:no Physical Aggression / Violence:No  Access to Firearms a concern: No  Gang Involvement:No   Subjective:  Patient today in session working further on her anxiety, frustration, and feeling overwhelmed she states are her stronger symptom. States frustration is her strongest symptom related to family relationships and situation. Shares in more detail family plans this coming leading to a lot of changes and re-arranges as several additional family members are coming in during part of Easter. Feels her stress level and patience are being tested and feeling more stressed and pushed. Worked today on increasing her ability to let go of things out of her control, being able to understand that her upcoming stressors are time limited and it is to her advantage to use some of the "coping skills" we are working on in session today to help her through this time in a positive way versus overly focusing on the  upcoming changes over the next 2-3 days. Discussed in detail how patient can manage her stressors more effectively rather than looking only at the negatives, but rather, being more flexible, and interacting with others who will be visiting these 2-3 days and involving herself in helping her parents entertain and enjoy the time with extended family rather than focusing on frustrations and alterations being made at home to accommodate everyone.  Dealing with change is difficult for patient and we talked about how this opportunity is going give her some good practice at being able to make changes, readjust, and learn from this experience and others how the way we cope with things can have a lot to do with how we engage in situations and our outlook.  Patient realizing that this upcoming situation is typical and how things that do not have to necessarily be that stressful, can be for her and is hoping that she can manage the situation differently and learn from it.  Reviewed a couple more situations from recent past where she has tended to personalize issues under stress, making it harder to manage stress.  To continue her work on emotional challenges, managing stress, developing a stronger sense of self-esteem, adapting to changes more effectively, and feeling that she "fits in" more with other people or groups.  Continues to work on trying to decrease negative thinking.  Interventions: Cognitive Behavioral Therapy and Ego-Supportive Long term goal: Stabilize anxiety level while increasing ability to function on a daily basis, as Evidenced by patient  being able to function daily without feeling her anxiety level impedes her functioning level.Percy Bracken term goal: Increase understanding of beliefs and messages that produce worry and amxiety, as Evidenced by patient clearly stating her improved understanding of negative/anxious thought patterns and how they affect how they lead to worry and  anxiety. Strategies: 1)Help patient develop reality-based, positive cognitive messages. 2)Increase daily use of positive self-talk  3) refrain from assuming worst-case scenarios   Diagnosis:   ICD-10-CM   1. Generalized anxiety disorder  F41.1      Plan:   Patient today continuing her work on anxiety, obsessive thoughts at times, depressive feelings communication, relationship issues, family relationships, and her overall personal goals that include finding a job in her field.  Encouraging patient to work further on being able to accept what we cannot change and trying to make the best of things even in challenging situations, as noted in session today.  Support her trying to have healthier boundaries, healthier relationships within the family, allowing herself to be understood more fully, not assuming that someone's words or actions are meant critically, broaden her scope of acceptance of other people even when she does not and continue goal-directed behaviors and working on her beliefs and feelings about herself. Encouraged patient in her practice of positive and self affirming behaviors in her daily life and work including: Stop focusing on perceived weaknesses and instead focus on her strengths including those that she is working on in sessions, work on decreasing her procrastination, look for more positives versus negatives daily, refrain from assuming worst-case scenarios, stay in contact with supportive people, challenge and counteract self-doubt, avoid making assumptions about what others may be thinking and instead ask them, and recognize the strengths she shows working with goal-directed behaviors moving in a direction that supports her improved emotional health and overall wellbeing.  Caitlyn Buchanan has made progress in some of her goals and continues to be motivated and focused as she works with goal-directed behaviors to keep moving in a more positive and overall healthier direction.  Goal  review and progress/challenges noted with patient.  Next appointment within 2 weeks.   Kelleen Patee, LCSW

## 2023-12-01 ENCOUNTER — Ambulatory Visit: Admitting: Psychiatry

## 2023-12-01 DIAGNOSIS — F411 Generalized anxiety disorder: Secondary | ICD-10-CM

## 2023-12-01 NOTE — Progress Notes (Signed)
 [facility] Counselor/Therapist Progress Note  Patient ID: Vanessa Doyle, MRN: [id number],    Date: 12/01/2023  Time Spent: 55 minutes   Treatment Type: Individual Therapy  Reported Symptoms:  anxiety, frustration, depression improving, sadness improving, overwhelmed, obsessive thoughts improving   Mental Status Exam:  Appearance:   Casual and Neat     Behavior:  Appropriate, Sharing, and Motivated  Motor:  Normal  Speech/Language:   Clear and Coherent  Affect:  Depressed and anxious  Mood:  anxious and depressed  Thought process:  goal directed  Thought content:    WNL  Sensory/Perceptual disturbances:    WNL  Orientation:  oriented to person, place, time/date, situation, day of week, month of year, year, and stated date of December 01, 2023  Attention:  Fair  Concentration:  Good and Fair  Memory:  WNL  Fund of knowledge:   Good  Insight:    Good  Judgment:   Good  Impulse Control:  Good   Risk Assessment: Danger to Self:  No Self-injurious Behavior: No Danger to Others: No Duty to Warn:no Physical Aggression / Violence:No  Access to Firearms a concern: No  Gang Involvement:No   Subjective:  Patient showing some increased activity and working on her goals today and we talked further about her need to keep her motivation up and ways that she can work on that.  Patient very receptive.  Now that the Easter holiday is over and out of town family have left, she reports feeling more motivated to get back to working on job search and her own mental health.  Currently she reports that her stronger symptoms are anxiety, frustration (related to family), feeling overwhelmed. Not having a job yet "in her field" is creating significant stress/anxiety.  Discussed some strategies particularly regarding her anxiety and stress about not having a job yet, along with some ways of thinking and physical action that can help her motivation and drive.  Continues to use yoga to help in  calming herself at times. [employer] Curling activities "went ok" with the family, although stressful at times. Encouraging aunt to consider therapy for her grief as she has been reluctant earlier. Patient herself feels she is progressing through her grief and is encouraged as she moves forward, however finds that other family grief issues re-surfaced some but has been manageable.  Continues to work with her "coping skills" in and outside of sessions.  Focusing on managing change more easily and confidently and this is a work in progress.  Particularly focusing on being able to better let go of the things that are out of her control.  Continues her work on managing stress more effectively, not avoiding emotional challenges, adapting to changes more effectively, trying to have a stronger sense of self-esteem, and embracing the feeling that she can and does "fit in with more people than she imagines.  Along with this, patient is also working to decrease negative thinking about herself in situations.  Over all feeling more encouraged in her work on treatment goals and progress.   Interventions: Cognitive Behavioral Therapy, Ego-Supportive, and Grief Therapy  Long term goal: Stabilize anxiety level while increasing ability to function on a daily basis, as Evidenced by patient being able to function daily without feeling her anxiety level impedes her functioning level.Percy Bracken term goal: Increase understanding of beliefs and messages that produce worry and amxiety, as Evidenced by patient clearly stating her improved understanding of negative/anxious thought patterns and how they affect  how they lead to worry and anxiety. Strategies: 1)Help patient develop reality-based, positive cognitive messages. 2)Increase daily use of positive self-talk  3) refrain from assuming worst-case scenarios   Diagnosis:   ICD-10-CM   1. Generalized anxiety disorder  F41.1      Plan:  Patient actively participating in session and we  focused more on the front end of session, on her motivation and trying to get that at a higher level and be able to maintain it better.  Continues to work inside and outside of sessions on her obsessive thoughts, depressive feelings and communication, anxiety, family relationship issues, and her overall personal goals including applying for and getting a job in her field.  Continue to encourage patient in accepting what she cannot change and trying to make the best of situations as much as she is able.  Working on healthier boundaries, broadening her acceptance of other people, and continue her work with goal-directed behaviors on which she is making progress. Encouraged patient to be practicing positive and self affirming behaviors in her daily life and work including: Refrain from focusing on perceived weaknesses and instead focus on her strengths including those that she is currently working on in sessions, work on decreasing her procrastination, look for more positives versus negatives each day, refrain from assuming worst-case scenarios, stay in contact with supportive people, challenge and counteract self-doubt, avoid making assumptions about what others may be thinking and instead ask them, and recognize the strengths she shows working with goal-directed behaviors moving in a direction that supports her improved emotional health and her overall outlook into the future.  Payson Crumby continues to make progress and working on her goals, is motivated and focused as she works with goal-directed behaviors to keep moving in a positive and more hopeful direction.  Goal review and progress/challenges noted with patient.  Next appointment within 2 weeks.   Kelleen Patee, LCSW

## 2023-12-09 ENCOUNTER — Ambulatory Visit: Admitting: Psychiatry

## 2023-12-09 DIAGNOSIS — F411 Generalized anxiety disorder: Secondary | ICD-10-CM | POA: Diagnosis not present

## 2023-12-09 NOTE — Progress Notes (Signed)
 [facility] Counselor/Therapist Progress Note  Patient ID: Vanessa Doyle, MRN: [id number],    Date: 12/09/2023  Time Spent: 55 minutes   Treatment Type: Individual Therapy  Reported Symptoms: anxiety, depression improving, overwhelmed, obsessive thoughts improving    Mental Status Exam:  Appearance:   Casual     Behavior:  Appropriate, Sharing, and Motivated  Motor:  Normal  Speech/Language:   Clear and Coherent  Affect:  Anxious, depressed but improving  Mood:  anxious and depressed  Thought process:  goal directed  Thought content:    Rumination  Sensory/Perceptual disturbances:    WNL  Orientation:  oriented to person, place, time/date, situation, day of week, month of year, year, and stated date of December 09, 2023  Attention:  Fair  Concentration:  Good and Fair  Memory:  WNL  Fund of knowledge:   Good  Insight:    Good and Fair  Judgment:   Good and Fair  Impulse Control:  Good   Risk Assessment: Danger to Self:  No Self-injurious Behavior: No Danger to Others: No Duty to Warn:no Physical Aggression / Violence:No  Access to Firearms a concern: No  Gang Involvement:No   Subjective: Patient today reports continued work on her treatment goal especially her anxiety, frustration, overwhelmedness at times, motivation, decreasing ruminating. Admits she gets in her own way in trying to make progress and she overthinks which is a problem. Focused more on this today and trying to interrupt her tendency to Overthink which is a big factor in her difficulty in task completion. Blurts out "I am extremely triggered by my sister-in-law's pregnancy, and not sure why", and discussed this more in detail. Patient process her comment in more detail but not all details included in this note. Did agree that she is being blocked from taking next steps to move forward by her  tendency to overthink to the point of obsessing and also over-focusing and overthinking on "every detail".  Used  most of the session today to focus on this as it is really keeping patient from moving forward and she agrees with this.  Working more directly on her overthinking and obsessing and agrees to try out some different strategies and apply these also at home in between sessions as we work on this together.  Seems committed and this would help also to "narrow her focus some" that might contribute to more progress than trying to do so many things that wants and overthinking most things.  Also to continue working to decrease negative thought patterns.  Interventions: Cognitive Behavioral Therapy and Ego-Supportive Long term goal: Stabilize anxiety level while increasing ability to function on a daily basis, as Evidenced by patient being able to function daily without feeling her anxiety level impedes her functioning level.Percy Bracken term goal: Increase understanding of beliefs and messages that produce worry and amxiety, as Evidenced by patient clearly stating her improved understanding of negative/anxious thought patterns and how they affect how they lead to worry and anxiety. Strategies: 1)Help patient develop reality-based, positive cognitive messages. 2)Increase daily use of positive self-talk  3) refrain from assuming worst-case scenarios   Diagnosis:   ICD-10-CM   1. Generalized anxiety disorder  F41.1      Plan: Patient participating well in session today focusing further on her tendency to overthink everything, delayed taking action on goals, motivation, and obsessing and then feeling frustrated.  Participation was better in session today and she seems motivated to continue working on her obsessive  thoughts especially outside of session more.  Still working on excepting what she cannot change and trying to make the best of situations.  Wanting healthier boundaries, and some better interaction skills in certain relationships which we also talked some about today.  Reminded and encouraged patient to  continue practicing positive and self affirming behaviors in daily life and work including: Working on decreasing her procrastination, look for more positives versus negatives daily, refrain from assuming worst-case scenarios, remain in contact with supportive people, refrain from focusing on perceived weaknesses and instead focus on her strengths including those that she is currently working on in sessions, challenge and counteract self-doubt, avoid making assumptions about what others may be thinking and instead asked them, and recognize the strengths she shows working with goal-directed behaviors moving in a direction that supports her improved emotional health and her overall outlook into the future.  Kior Florio is making progress and working with her goals to be more motivated and focused with her goal-directed behaviors to keep moving in a positive and more hopeful direction into the future.  Goal review and progress/challenges noted with patient.  Next appointment within 2 weeks.   Kelleen Patee, LCSW

## 2023-12-17 ENCOUNTER — Ambulatory Visit: Admitting: Psychiatry

## 2023-12-17 DIAGNOSIS — F411 Generalized anxiety disorder: Secondary | ICD-10-CM

## 2023-12-17 NOTE — Progress Notes (Signed)
 [facility] Counselor/Therapist Progress Note  Patient ID: Vanessa Doyle, MRN: [id number],    Date: 12/17/2023  Time Spent: 50 minutes   Treatment Type: Individual Therapy  Reported Symptoms: anxiety, less depression recently, overwhelmed (better), obsessive thoughts improving     Mental Status Exam:  Appearance:   Casual     Behavior:  Appropriate, Sharing, and Motivated  Motor:  Normal  Speech/Language:   Clear and Coherent and Normal Rate  Affect:  anxious  Mood:  anxious and some depression but better  Thought process:  goal directed  Thought content:    Rumination  Sensory/Perceptual disturbances:    WNL  Orientation:  oriented to person, place, time/date, situation, day of week, month of year, year, and stated date of Dec 17, 2023  Attention:  Fair  Concentration:  Fair  Memory:  WNL  Fund of knowledge:   Good and Fair  Insight:    Good and Fair  Judgment:   Good  Impulse Control:  Good   Risk Assessment: Danger to Self:  No Self-injurious Behavior: No Danger to Others: No Duty to Warn:no Physical Aggression / Violence:No  Access to Firearms a concern: No  Gang Involvement:No   Subjective:  Patient in session today ready to work further on her anxiety, frustrations, motivation, feeling overwhelmed often, and trying to decrease her ruminating.  Continues to acknowledge that she does get in her own way as she overthinks a lot while she is trying to make progress and this gets in her way of actually "doing things" and not just thinking about them.Some improvement in overthinking and some in motivation. Wants to move out on her own and states that is helping motivation. Attended part of family vacation which "went ok". Noticeable concerns with family member and this continues over time. Processed this in more detail today and able to acknowledge it more clearly for herself. (Not all details included in this note due to patient privacy needs.) Shared some of her work  since last session and is sensing some progress in her motivation and "focusing more on my life and what I'm working on to get better." Struggles with being "23 and not yet living out on my own." Shared and worked on her feelings about this in session today and able to be less judgmental of herself.  Managing some stress in healthier ways.  Some improvement in self-esteem.  Worked further on her feeling of "being blocked at times from taking neck steps to move forward by her tendency to overthink to the point of obsessing and also over focusing on every detail", keeping her from moving forward in a more timely manner.  Did not dodge issues today and confronted situations more directly which is definitely an improvement for patient.  Showing more commitment and acknowledged at one point in session that she "needs to focus more on specific things versus trying to fix everything at once".  Stressed the importance of increased commitment to her goals and also the increased effort that she seems to be showing more recently in working on her therapy goals.    Interventions: Cognitive Behavioral Therapy, Solution-Oriented/Positive Psychology, and Ego-Supportive Long term goal: Stabilize anxiety level while increasing ability to function on a daily basis, as Evidenced by patient being able to function daily without feeling her anxiety level impedes her functioning level.Percy Bracken term goal: Increase understanding of beliefs and messages that produce worry and amxiety, as Evidenced by patient clearly stating her improved understanding  of negative/anxious thought patterns and how they affect how they lead to worry and anxiety. Strategies: 1)Help patient develop reality-based, positive cognitive messages. 2)Increase daily use of positive self-talk  3) refrain from assuming worst-case scenarios  Diagnosis:   ICD-10-CM   1. Generalized anxiety disorder  F41.1      Plan:   Patient in session today working further  on her motivation, follow-through and taking action on goals and not postponing, overthinking, obsessing, and frustrations.  More alert and more "on task" today in session and seemed to be positively challenged in some of our talking about working more effectively on her goals, believing more in herself, and less postponing of action towards goals.  Continued work on healthier boundaries, interaction skills particularly in certain relationships that she finds challenging at times including within the family. Encouraged patient and her continued practice of positive and self affirming behaviors in her daily life including: Working to decrease her procrastination, looking for more positives versus negatives daily, refrain from assuming worst-case scenarios, remain in contact with supportive people, refrain from focusing on perceived weaknesses and instead focus on her strengths including those that she is currently working on in sessions, challenge and counteract self-doubt, avoid making assumptions about what others may be thinking and instead ask them, and realize the strength she shows when working with goal-directed behaviors trying to move in a direction that supports her improved emotional health and her overall wellbeing and outlook into the future.  Monserat Burhans has made progress and continues working with her goals to be more motivated and focused on specific goal-directed behaviors so as to keep moving in a positive and more hopeful direction in her future.  Goal review and progress/challenges noted with patient.  Next appt within 2 weeks.   Kelleen Patee, LCSW

## 2023-12-22 ENCOUNTER — Ambulatory Visit (INDEPENDENT_AMBULATORY_CARE_PROVIDER_SITE_OTHER): Admitting: Psychiatry

## 2023-12-22 DIAGNOSIS — F411 Generalized anxiety disorder: Secondary | ICD-10-CM

## 2023-12-22 NOTE — Progress Notes (Signed)
 [facility] Counselor/Therapist Progress Note  Patient ID: Vanessa Doyle, MRN: [id number],    Date: 12/22/2023  Time Spent: 53 minutes   Treatment Type: Individual Therapy  Reported Symptoms: anxiety, depression decreasing, obsessive thoughts improving   Mental Status Exam:  Appearance:   Casual and Neat     Behavior:  Appropriate, Sharing, and Motivated  Motor:  Normal  Speech/Language:   Clear and Coherent  Affect:  Anxious, some depression  Mood:  anxious and depressed  Thought process:  goal directed  Thought content:    Rumination  Sensory/Perceptual disturbances:    WNL  Orientation:  oriented to person, place, time/date, situation, day of week, month of year, year, and stated date of Dec 22, 2023  Attention:  Good  Concentration:  Good  Memory:  WNL  Fund of knowledge:   Good  Insight:    Good  Judgment:   Good  Impulse Control:  Good   Risk Assessment: Danger to Self:  No Self-injurious Behavior: No Danger to Others: No Duty to Warn:no Physical Aggression / Violence:No  Access to Firearms a concern: No  Gang Involvement:No   Subjective: Patient today in session and motivated working further on her anxiety, motivation, frustrations, decreasing ruminating, and feeling overwhelmed. Attended 1 family event that would normally stress her more, but this occasion patient reporting today that she managed the situation more effectively, and shared in session her coping skills at that event and what may have helped her. Trying to recognize more when she "gets in her own way and overthinks", and trying to interrupt it in order to develop more self confidence and believe more in herself with less overthinking, which hinders "actually doing things." Wanting to get out on her own and eventually being able to live out on her own. "I'm getting some better in taking steps trying to move in positive direction. Motivation increasing gradually. Focusing more on her steps as she  works to get better. Less self-judgement . Increased self-esteem. Shared some examples of her managing adversity and discouragement better, and encouraged patient on these in session today. Today confronting issues more head-on, and seeming "less blocked today her her thoughts and motivation to move forward. Not dodging issues and challenges as much and is following through in ways that have been difficult for patient previously, and still struggles some with believing in herself and having self-confidence. Wrestles with her negative thoughts at times and interrupting them more with several examples shared. Encouraged her in continuing to journal as that has been helpful for patient. Setting limits for herself is help as she sets more realistic expectations and boundaries.  Trying to keep her "focus on specific things 1 at the time versus trying to fix everything at once."  Encouraged patient in the increased commitment she is showing and working on her therapy goals and this is also evidenced in some of her improved mood.   Interventions: Cognitive Behavioral Therapy and Ego-Supportive  Long term goal: Stabilize anxiety level while increasing ability to function on a daily basis, as Evidenced by patient being able to function daily without feeling her anxiety level impedes her functioning level.Percy Bracken term goal: Increase understanding of beliefs and messages that produce worry and amxiety, as Evidenced by patient clearly stating her improved understanding of negative/anxious thought patterns and how they affect how they lead to worry and anxiety. Strategies: 1)Help patient develop reality-based, positive cognitive messages. 2)Increase daily use of positive self-talk  3) refrain from  assuming worst-case scenarios   Diagnosis:   ICD-10-CM   1. Generalized anxiety disorder  F41.1      Plan:  Patient today working in session further on her taking more action on goals and not postponing, working  more on her motivation, overthinking, obsessing, and frustrations.  Also focusing more on coaching herself through situations and has worked on in session, trying to improve her self-esteem, and decrease self-negating.  She is doing well and working with her treatment goals.  Vanessa Doyle continues to make progress and making some specific positive changes as noted above.  Staying more on task and having a more open mind about herself and her progress focusing more on positives and possibilities versus negatives.  Encouraged her to continue practicing healthier boundaries, interactional skills, and believing more in herself as she has begun to do this more recently. Encouraged patient to continue her practice of positive and self affirming behaviors in her daily life trying to make it more of a pattern including: Looking for more positives versus negatives each day, working to decrease her procrastination, refrain from assuming worst-case scenarios, remain in contact with supportive people, refrain from focusing on perceived weaknesses and instead focus on her strengths including those that she is currently working on in sessions, challenge and counteract self-doubt, avoid making assumptions about what others may be thinking and instead ask them, and recognize the strength she shows when working with goal-directed behaviors trying to move in a direction that supports her improved emotional health and overall wellbeing and outlook into the future.  Vanessa Doyle is progressing and needs to continue working with her goals to be more motivated and specifically focused on goal-directed behaviors to keep moving in a positive or more hopeful direction into the future.  Goal review and progress/challenges noted with patient.  Next appointment within 2 weeks.   Kelleen Patee, LCSW

## 2023-12-28 ENCOUNTER — Ambulatory Visit: Admitting: Psychiatry

## 2023-12-28 DIAGNOSIS — F411 Generalized anxiety disorder: Secondary | ICD-10-CM

## 2023-12-28 NOTE — Progress Notes (Signed)
 [facility] Counselor/Therapist Progress Note  Patient ID: ALASKA FLETT, MRN: [id number],    Date: 12/28/2023  Time Spent:  53 minutes  Treatment Type: Individual Therapy  Reported Symptoms:  anxiety, depression continues to decrease, obsessive thoughts improving    Mental Status Exam:  Appearance:   Casual and Neat     Behavior:  Appropriate, Sharing, and Motivated  Motor:  Normal  Speech/Language:   Normal Rate  Affect:  Some anxiety and depression "but improving"  Mood:  anxious and depressed  Thought process:  goal directed  Thought content:    Obsessive thoughts improving  Sensory/Perceptual disturbances:    WNL  Orientation:  oriented to person, place, time/date, situation, day of week, month of year, year, and stated date of Dec 28, 2023  Attention:  Good  Concentration:  Good and Fair  Memory:  WNL  Fund of knowledge:   Good  Insight:    Good  Judgment:   Good  Impulse Control:  Good   Risk Assessment: Danger to Self:  No Self-injurious Behavior: No Danger to Others: No Duty to Warn:no Physical Aggression / Violence:No  Access to Firearms a concern: No  Gang Involvement:No   Subjective: Patient in session today showing good motivation and continues to focus on her anxiety, motivation, frustrations, decreasing her obsessiveness and ruminating, and decreasing overwhelmedness. More communication with parents feeling better. "Trying to stay out of my own way and working to decrease her overthinking and interrupting it as it hinders her belief in herself and her self confidence. Encouraged to continue working on this outside of session with strategies discussed in sessions. Eager to eventually get out on her own eventually. States she is using more positive visualizations of herself, changing and acting more proactively including trying things that she's not tried previously, especially re: possible job opportunities, as she continues to more directly confront  obstacles and focus more on her goal-directed behaviors. Motivation work today is good and patient feeling herself moving in more positive direction. Decreased self-judgment and improved self-esteem. Working to not be as stressed when confronting obstacles. Increasing motivation. Better at confronting difficult things and believing more in herself. Some negative thoughts but working to decrease them. Setting healthier limits and realistic expectations. Her increased commitment is very noticeable and mood is improved also.    Interventions: Cognitive Behavioral Therapy, Solution-Oriented/Positive Psychology, and Ego-Supportive  Long term goal: Stabilize anxiety level while increasing ability to function on a daily basis, as Evidenced by patient being able to function daily without feeling her anxiety level impedes her functioning level.Percy Bracken term goal: Increase understanding of beliefs and messages that produce worry and amxiety, as Evidenced by patient clearly stating her improved understanding of negative/anxious thought patterns and how they affect how they lead to worry and anxiety. Strategies: 1)Help patient develop reality-based, positive cognitive messages. 2)Increase daily use of positive self-talk  3) refrain from assuming worst-case scenarios   Diagnosis:   ICD-10-CM   1. Generalized anxiety disorder  F41.1      Plan:     Patient today showing good motivation as she works further on her goals in session today, continue to focus on not putting things off is much and trying to work in a more motivated/confident manner.  Specifically trying to decrease obsessive thoughts, overthinking, improve her self-esteem, and better managing frustrations. Encouraged Lyra to continue her practicing of healthier boundaries, interactional skills as practiced in sessions, and believing more in herself as she has  begun to do this more recently.  She is showing good effort to continue practicing positive  and self affirming behaviors in her daily life, trying to make it more of a pattern including: Looking for more positives versus negatives daily, working to decrease her procrastination, refrain from assuming worst-case scenarios, remain in contact with supportive people, refrain from focusing on perceived weaknesses and instead focus on her strengths including those which she is currently working on in sessions, challenge and counteract her self-doubt, avoid making assumptions about what others may be thinking and instead asked them, and recognize the strengths she shows when working with goal-directed behaviors with her goal being to move in a direction that supports her improved emotional health and overall wellbeing into the future. Keyunna Coco is continuing to show some increased motivation and needs to continue her work with goal-directed behaviors in order to keep moving in a forward direction into her future.  Goal review and progress/challenges noted with patient.  Next appointment within 2 weeks.   Kelleen Patee, LCSW

## 2024-01-13 ENCOUNTER — Ambulatory Visit (INDEPENDENT_AMBULATORY_CARE_PROVIDER_SITE_OTHER): Admitting: Psychiatry

## 2024-01-13 DIAGNOSIS — F411 Generalized anxiety disorder: Secondary | ICD-10-CM

## 2024-01-13 NOTE — Progress Notes (Signed)
 [facility] Counselor/Therapist Progress Note  Patient ID: Vanessa Doyle, MRN: [id number],    Date: 01/13/2024  Time Spent: 50 minutes   Treatment Type: Individual Therapy  Reported Symptoms: anxiety, depression decreased some, obsessive thoughts improving     Mental Status Exam:  Appearance:   Casual and Neat     Behavior:  Appropriate and Sharing  Motor:  Normal  Speech/Language:   Clear and Coherent  Affect:  Depressed and anxious  Mood:  anxious and depressed  Thought process:  goal directed  Thought content:    WNL  Sensory/Perceptual disturbances:    WNL  Orientation:  oriented to person, place, time/date, situation, day of week, month of year, year, and stated date of January 13, 2024  Attention:  Good  Concentration:  Good  Memory:  WNL  Fund of knowledge:   Good  Insight:    Good  Judgment:   Good  Impulse Control:  Good and Fair   Risk Assessment: Danger to Self:  No Self-injurious Behavior: No Danger to Others: No Duty to Warn:no Physical Aggression / Violence:No  Access to Firearms a concern: No  Gang Involvement:No   Subjective:   Patient in session today focusing further on her anxiety, frustrations, motivation,  and some  feelings of overwhelmedness at work. Problem-solved some with her feelings of overwhelmedness, anxiety, and frustration management. Motivation some better but needs to be more consistently motivated. Difficulty when not able to "stay in my regular routine" which is happening now. Stressed but able to work to see how she is working well even though she is "out of her routine."  Is sleeping better now and fears "going back to the times when I couldn't sleep well." Difficulty sometimes in holding onto positive changes and trying to reverse this. May be renting a room out of a friend's apartment in early Fall which she feels would be good for her. 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  Continues working with more positive visualizations of herself, being more proactive and trying new things, following through on any possible job opportunities or leads, as she continues to try and be more hopeful and focus on her goal-directed behaviors.  Reports feeling more positive about herself and that is showing also in sessions with her.  Trying to confront obstacles more directly versus avoiding them.  Self-esteem and refraining from self judgment are both improved.  Recognizing that her motivation is also improving and she is intentionally trying to make healthy decisions for herself. Reports decrease in negative thoughts.  Working to have healthier limits and expectations that are realistic while having a sense of commitment to herself and believing that she can move forward in positive ways.   Interventions: Cognitive Behavioral Therapy, Solution-Oriented/Positive Psychology, and Ego-Supportive Long term goal: Stabilize anxiety level while increasing ability to function on a daily basis, as Evidenced by patient being able to function daily without feeling her anxiety level impedes her functioning level.Vanessa Doyle term goal: Increase understanding of beliefs and messages that produce worry and amxiety, as Evidenced by patient clearly stating her improved understanding of negative/anxious thought patterns and how they affect how they lead to worry and anxiety. Strategies: 1)Help patient develop reality-based,  positive cognitive messages. 2)Increase daily use of positive self-talk  3) refrain from assuming worst-case scenarios   Diagnosis:   ICD-10-CM   1. Generalized anxiety disorder  F41.1      Plan:    Patient showing good motivation today in session working on her goals and focusing more on not  putting things off as much and trying to follow through and being more motivated, in which she is showing progress.  Noticing the feeling more of making some progress and treating her own self better which is supporting her overall treatment goal plan.  Working to let go of the excessive thought patterns, overthinking, and trying to better manage frustrations and improved self-esteem. Encouraged patient in keeping healthier boundaries, good interactional skills as practiced in sessions and believing more in herself and has definitely been showing more progress in this recently.  Continues to practice positive and self affirming behaviors in her daily life including: Looking for positives versus negatives each day, working to decrease her procrastination, refrain from assuming worst-case scenarios, remain in contact with supportive people, refrain from focusing on perceived weaknesses and instead focus on her strengths including those which she is currently working on in sessions, challenge and counteract self-doubt, avoid making assumptions about what others may be thinking and instead ask them, and recognize the strength she shows working with goal-directed behaviors of moving in a direction that supports her improved emotional health and overall wellbeing currently and into her future.  Vanessa Doyle is making progress, her motivation has increased and she needs to continue her work with goal-directed behaviors to keep moving in a healthier and more hopeful direction in her future.  Goal review and progress/challenges noted with patient.  Next appointment within 2 weeks.   Kelleen Patee, LCSW

## 2024-02-02 ENCOUNTER — Ambulatory Visit: Admitting: Psychiatry

## 2024-02-02 DIAGNOSIS — F411 Generalized anxiety disorder: Secondary | ICD-10-CM | POA: Diagnosis not present

## 2024-02-02 NOTE — Progress Notes (Signed)
 [facility] Counselor/Therapist Progress Note  Patient ID: Vanessa Doyle, MRN: [id number],    Date: 02/02/2024  Time Spent: 50 minutes   Treatment Type: Individual Therapy  Reported Symptoms:  anxiety,  depression decreasing, obsessive thoughts, procrastination improving   Mental Status Exam:  Appearance:   Casual and Neat     Behavior:  Appropriate, Sharing, and Motivated  Motor:  Normal  Speech/Language:   Clear and Coherent  Affect:  anxious  Mood:  anxious and depression decreasing  Thought process:  goal directed  Thought content:    Rumination  Sensory/Perceptual disturbances:    WNL  Orientation:  oriented to person, place, time/date, situation, day of week, month of year, year, and stated date of February 02, 2024  Attention:  Good/Fair  Concentration:  Good  Memory:  WNL  Fund of knowledge:   Good  Insight:    Good  Judgment:   Good  Impulse Control:  Good   Risk Assessment: Danger to Self:  No Self-injurious Behavior: No Danger to Others: No Duty to Warn:no Physical Aggression / Violence:No  Access to Firearms a concern: No  Gang Involvement:No   Subjective:   Patient in session today reporting symptoms of anxiety, overwhelmingness, frustration, and tendency to procrastination. Feeling overwhelmed has been really strong recently.  Is working this summer and still hoping for a job in her field. States she is hoping to move out into her own apartment and parents planning to move within town as family is increasing with addition of grandchild soon and they are needing larger home environment. Trying to keep up a better routine for task completion and staying on top of things. Is managing some better when things are not always exactly as expected per her report. Some improvement in sleeping. More self-love. Hoping to hold onto some of the changes she feels she is gradually making. Intentionally spending more time with friends as she is able. Feels she has been  more distracted recently with world events and trying to not [employer]  world news that is very disturbing .  Affirmed patient's agreement to back off from watching as much of the world news on TV or online, as we agreed this would be helpful at this point for patient.  Continues to work on her self-esteem and self judgment and showing progress.  Motivation continues to be improved although not always sustained.  Focusing on healthy decision making.  Trying to have more realistic expectations of herself.  Working to decrease her negative thinking.  Gaining a stronger sense of commitment to herself and her ability to move forward and more positive and productive ways into the future.   Interventions: Cognitive Behavioral Therapy and Ego-Supportive Long term goal: Stabilize anxiety level while increasing ability to function on a daily basis, as Evidenced by patient being able to function daily without feeling her anxiety level impedes her functioning level.[name] term goal: Increase understanding of beliefs and messages that produce worry and amxiety, as Evidenced by patient clearly stating her improved understanding of negative/anxious thought patterns and how they affect how they lead to worry and anxiety. Strategies: 1)Help patient develop reality-based, positive cognitive messages. 2)Increase daily use of positive self-talk  3) refrain from assuming worst-case scenarios   Diagnosis:   ICD-10-CM   1. Generalized anxiety disorder  F41.1      Plan: Patient in session today showing good participation and motivation as she worked further on her goals to improve the management of  her anxiety, better manage her frustration, feelings of overwhelmingness, procrastination, trying to decrease negative self talk, focus on being more realistic with herself and expectations, and refrain from putting things off so much and take care of tasks more readily, which she is showing some improvement in this  area.  Trying to let go of it assess of thought patterns, her overthinking, and feels that she is better managing frustrations.  Self-esteem has shown some improvement again more recently.  Encouraged patient to be practicing positive and self affirming behaviors in her daily life including: Looking for positives versus negatives each day, working to decrease her procrastination, refrain from assuming worst-case scenarios, remain in contact with supportive people, refrain from focusing on perceived weaknesses and instead focus on her strengths including those which she is currently working on the sessions, challenge and counteract self-doubt, avoid making assumptions about what others may be thinking and instead ask them, and recognize the strength she shows working with goal-directed behaviors of moving in a direction that supports her improved emotional health and overall wellbeing currently and into the future.  Vanessa Doyle is making progress, as her motivation has increased and she needs to continue working with goal-directed behaviors so as to keep moving forward in a more hopeful and healthier direction.  Goal review and progress/challenges noted with patient.  Next appt. within 2 weeks.   [name], LCSW

## 2024-02-08 ENCOUNTER — Ambulatory Visit: Admitting: Psychiatry

## 2024-02-10 ENCOUNTER — Ambulatory Visit: Admitting: Psychiatry

## 2024-02-10 DIAGNOSIS — F411 Generalized anxiety disorder: Secondary | ICD-10-CM

## 2024-02-10 NOTE — Progress Notes (Signed)
 [facility] Counselor/Therapist Progress Note  Patient ID: Vanessa Doyle, MRN: [id number],    Date: 02/10/2024  Time Spent: 53 minutes   Treatment Type: Individual Therapy  Reported Symptoms:  anxiety, depression decreasing, obsessive thoughts, procrastination, overwhelmed, frustrated    Mental Status Exam:  Appearance:   Casual and Neat     Behavior:  Appropriate, Sharing, and Motivated  Motor:  Normal  Speech/Language:   Clear and Coherent  Affect:  Depressed and anxious  Mood:  anxious and depressed  Thought process:  goal directed  Thought content:    Rumination  Sensory/Perceptual disturbances:    WNL  Orientation:  oriented to person, place, time/date, situation, day of week, month of year, year, and stated date of February 10, 2024  Attention:  Fair  Concentration:  Fair  Memory:  WNL  Fund of knowledge:   Good  Insight:    Good and Fair  Judgment:   Good  Impulse Control:  Good and Fair   Risk Assessment: Danger to Self:  No Self-injurious Behavior: No Danger to Others: No Duty to Warn:no Physical Aggression / Violence:No  Access to Firearms a concern: No  Gang Involvement:No   Subjective:   Patient today in session and reporting her symptoms to be anxiety, overwhelmingness, procrastination, some depression, obsessive thoughts, and frustration. Working today on issues of feeling overwhelmed, self-doubting, and procrastination. Overall happy sometimes but also feel anger, frustrated that I'm not moving forward in career and feeling more invalid', processing the feeling of invalid further. Brother and wife to have baby and patient feeling less than as compared with her brother. Discussed ways of better managing overwhelmedness. Facing more obstacles more directly. Feeling stuck and lost at times and today. Looking deeper and working to not feel held back.  Committed to pursuing job search and not feel help back.  Working harder on task completion and sticking  with projects until they are finished.  Still struggles some when things do not go as she expects or plans, and is working to be more adaptive.  Also working to have more self love and self-care.  Feeling good about some of the changes she is gradually making and how her thoughts are changing in a better direction.  Noticed being less distracted with world news and other issues more recently.  Motivation improving some and patient wanting to focus more heavily on this.  Feeling more committed to herself and wanting to believe in herself more which we talked about further in session today and she is to follow-up on this more between sessions and pick back up at her next appointment.  Interventions: Ego-Supportive Long term goal: Stabilize anxiety level while increasing ability to function on a daily basis, as Evidenced by patient being able to function daily without feeling her anxiety level impedes her functioning level.[name] term goal: Increase understanding of beliefs and messages that produce worry and amxiety, as Evidenced by patient clearly stating her improved understanding of negative/anxious thought patterns and how they affect how they lead to worry and anxiety. Strategies: 1)Help patient develop reality-based, positive cognitive messages. 2)Increase daily use of positive self-talk  3) refrain from assuming worst-case scenarios  Diagnosis:   ICD-10-CM   1. Generalized anxiety disorder  F41.1      Plan:  Patient today showing good effort and working further to improve her management of anxiety, feelings of overwhelmingness, better manage frustrations, reduce procrastination, decrease negative self talk, focus on being more realistic with  herself and expectations, and refraining from putting things off and instead take care of tasks more readily which patient is showing some improvement in this area.  Working to decrease her obsessive thought patterns and overthinking, and reports that  she is doing some better and managing frustrations in healthier ways.  Self-esteem showing gradual improvement.  Reminded patient to be practicing positive and self affirming behaviors in her daily life including: Stay in contact with supportive people, refrain from focusing on perceived weaknesses and instead focus on her strengths, looking for positives versus negatives daily, decrease her procrastination, refrain from assuming worst-case scenarios, challenge and counteract her self-doubt, avoid making assumptions about what others may be thinking and instead ask them, and realize the strength she shows when working with goal-directed behaviors to move in a direction that supports her improved emotional health and her overall wellbeing.  Malka Bocek continues to make progress, motivation continues to be stronger, and she needs to continue working with goal-directed behaviors so as to keep moving forward in a more hopeful and healthier direction into the future.  Goal review and progress/challenges noted with patient.  Next appointment within 2 weeks.   [name], LCSW

## 2024-02-15 ENCOUNTER — Ambulatory Visit: Admitting: Psychiatry

## 2024-02-15 DIAGNOSIS — F411 Generalized anxiety disorder: Secondary | ICD-10-CM

## 2024-02-15 NOTE — Progress Notes (Signed)
 [facility] Counselor/Therapist Progress Note  Patient ID: Vanessa Doyle, MRN: [id number],    Date: 02/15/2024  Time Spent: 53 minutes   Treatment Type: Individual Therapy  Reported Symptoms: anxiety, depression decreasing, obsessive thoughts, procrastination improving, overwhelmedness, frustrated   Mental Status Exam:  Appearance:   Casual     Behavior:  Appropriate, Sharing, and Motivated  Motor:  Normal  Speech/Language:   Clear and Coherent  Affect:  Depressed and anxiety  Mood:  anxious and depressed  Thought process:  goal directed  Thought content:    Rumination  Sensory/Perceptual disturbances:    WNL  Orientation:  oriented to person, place, time/date, situation, day of week, month of year, year, and stated date of February 15, 2024  Attention:  Good  Concentration:  Fair  Memory:  WNL  Fund of knowledge:   Good  Insight:    Good and Fair  Judgment:   Good  Impulse Control:  Good and Fair   Risk Assessment: Danger to Self:  No Self-injurious Behavior: No Danger to Others: No Duty to Warn:no Physical Aggression / Violence:No  Access to Firearms a concern: No  Gang Involvement:No   Subjective:    Patient in today reporting symptoms of anxiety, procrastination, obsessive thoughts, overwhelmingness, some depression, and frustration. Family moving to new house in approx 1 month and that has increased her stress level which changes tend to do, and we worked some today on her being more adaptable and not assuming the worst, and decreasing her procrastination which are highly impacting her mood currently. This past weekend included a lot of extra stress and frustration with family visiting from out of town. Patient working on managing her frustration and some anger in some of the family interpersonal issues going on, and looking at how she can set healthier boundaries and be able to communicate those boundaries more clearly. Reporting less procrastinating, still feeling  overwhelmed often but not in panic, and continues work on her self-doubting. Some anger especially at her not feeling she is moving forward as fast in her career but also understands this is not unusual in my field. Still feel invalid at times and working to stop this, per last session. Still feeling stuck and lost at times. Facing obstacles more directly. Continuing job search for something more in her field. Trying to complete tasks more often. Hard to adjust when things don't go as planned and we are working to help patient be more adaptive. Processing more of her feelings of self-care and self-love. Motivation continues to improve. Trying to be less distracted with negative world news. Trying to be more committed to herself and believe in herself more, and not feeling as stuck. Less concerned about what other think of me or my type of vocation.   Interventions: Cognitive Behavioral Therapy, Solution-Oriented/Positive Psychology, and Ego-Supportive  Long term goal: Stabilize anxiety level while increasing ability to function on a daily basis, as Evidenced by patient being able to function daily without feeling her anxiety level impedes her functioning level.[name] term goal: Increase understanding of beliefs and messages that produce worry and amxiety, as Evidenced by patient clearly stating her improved understanding of negative/anxious thought patterns and how they affect how they lead to worry and anxiety. Strategies: 1)Help patient develop reality-based, positive cognitive messages. 2)Increase daily use of positive self-talk  3) refrain from assuming worst-case scenarios   Diagnosis:   ICD-10-CM   1. Generalized anxiety disorder  F41.1  Plan:   Patient today actively  involved in session reporting some progress and continued need areas on which she continues to work. I am better at not taking things so seriously and sometimes actually can laugh at myself, in a positive way.   Her overwhelmingness is still present but has decreased some along with her frustrations and procrastination issues.  Less self talk that is negative and focusing on being more realistic with herself and her expectations particularly in trying to address tasks in a timely manner rather than procrastinating.  Showing more belief in herself and some more self acceptance, with this being a working progress.  Self-esteem continues showing some gradual improvement as well as her management of frustrations and healthier ways that are not self blaming. Encouraged patient to be practicing positive and self affirming behaviors in her daily life including: Staying in contact with supportive people, refrain from focusing on perceived weaknesses and instead focus on her strengths, look for more positives versus negatives daily, decrease her procrastination, refrain from assuming worst-case scenarios, challenge and counteract her self-doubt, avoid making assumptions about what others may be thinking and instead ask them, and recognize the strength she can show when working with goal-directed behaviors to move in a direction that supports her improved emotional health and her overall wellbeing.  Vanessa Doyle continues to make progress as evidenced by her continued motivation to make good choices, and she is needing at this point to continue working with goal-directed behaviors so as to keep moving forward in a hopeful and healthier direction.  Goal review and progress/challenges noted with patient.  Next appointment within 2 weeks.   [name], LCSW

## 2024-02-24 ENCOUNTER — Ambulatory Visit (INDEPENDENT_AMBULATORY_CARE_PROVIDER_SITE_OTHER): Admitting: Psychiatry

## 2024-02-24 DIAGNOSIS — F411 Generalized anxiety disorder: Secondary | ICD-10-CM

## 2024-02-24 NOTE — Progress Notes (Signed)
 [facility] Counselor/Therapist Progress Note  Patient ID: Vanessa Doyle, MRN: [id number],    Date: 02/24/2024  Time Spent: 55 minutes   Treatment Type: Individual Therapy  Reported Symptoms: anxiety, depression decreasing, obsessive thoughts, procrastination improving, overwhelmedness improved, frustrated    Mental Status Exam:  Appearance:   Casual and Neat     Behavior:  Appropriate, Sharing, and Motivated  Motor:  Normal  Speech/Language:   Clear and Coherent  Affect:  Depressed and anxious  Mood:  anxious and depressed  Thought process:  goal directed  Thought content:    Rumination  Sensory/Perceptual disturbances:    WNL  Orientation:  oriented to person, place, time/date, situation, day of week, month of year, year, and stated date of February 24, 2024  Attention:  Fair  Concentration:  Fair  Memory:  WNL  Fund of knowledge:   Good  Insight:    Good  Judgment:   Good  Impulse Control:  Good   Risk Assessment: Danger to Self:  No Self-injurious Behavior: No Danger to Others: No Duty to Warn:no Physical Aggression / Violence:No  Access to Firearms a concern: No  Gang Involvement:No   Subjective: Patient today reporting anxiety, procrastination, obsessive thoughts, overwhelmingness, some depression, and frustration.  Primary working today on the symptoms and some fears and self-doubt in the midst of challenges.  Second-guessing herself some but not as negative as she used to be towards herself in times of higher stress and dealing with some unknowns.  Working more intentionally on some self-doubt today as well as some fears about the future.  Feels that her purpose of life is mixed.  Trying to reframe from negative/blaming self talk.  Was able to focus more on her motivation rather than resorting to a lot of self blame as that just feeds the negativity towards herself.  States that she is frustrated that my path in life is not as clear as it seems to be for  others on their path and life.  Encouraged more self acceptance and less comparing self to others.  Working further on the self acceptance and helping patient understand its importance for her gradual improvement as well as her management of frustrations and trying to work with her mixed feelings at this point rather than letting them turn into self blaming.  Patient seemed to respond well to this and was less self negating.  Family is moving to a new house within the same city over the next month so that is adding some stress for her and parents.  Patient encouraged to not be assuming the worst which in the past has typically been a problem for her with change.  Recognizes her need to be more intentional and looking for what might go right and looking for the positives, while having more faith in herself that she can manage things that may not go as planned or expected.  Reports some decrease in procrastination.  Some decrease in feeling invalid.  Continues job [employer] and doing some short-term contract jobs in the meantime.  Difficulty adjusting when things do not go as planned nor hoped for.  Trying to give less attention to negative world news and that is helpful.  Shared that she is trying to be more committed to herself and believe more in herself even if she feels stuck at times she does realize that is not a permanent feeling for her.   Interventions: Cognitive Behavioral Therapy, Solution-Oriented/Positive Psychology, and Ego-Supportive Long term goal:  Stabilize anxiety level while increasing ability to function on a daily basis, as Evidenced by patient being able to function daily without feeling her anxiety level impedes her functioning level.[name] term goal: Increase understanding of beliefs and messages that produce worry and amxiety, as Evidenced by patient clearly stating her improved understanding of negative/anxious thought patterns and how they affect how they lead to worry and  anxiety. Strategies: 1)Help patient develop reality-based, positive cognitive messages. 2)Increase daily use of positive self-talk  3) refrain from assuming worst-case scenarios   Diagnosis:   ICD-10-CM   1. Generalized anxiety disorder  F41.1      Plan: Patient today noticing less self-negating. Is concerned about her field of work not having as many job openings and realizing AI is influencing this, and processing some of her discouragement and fears about the impact of AI. Working to hold onto positives about herself and changes she is trying to make.  Agrees to use her journaling more in between sessions currently, intentionally focused on more of her positives versus negatives, setting limits on her worry time, stay in touch with people that are supportive in her life, and pay more attention to the positives and the negatives.  Encouraged her and her making good choices as she continues to work with goal-directed behaviors and trying to keep moving forward in a more positive and healthier direction.  Micah Galeno is making progress and needs to continue her goal-directed work which supports her moving forward. Encouraged patient in her practice of positive and self-affirming behaviors in her daily life including: Staying in contact with supportive people, refrain from focusing on perceived weaknesses and instead focus on her strengths, look for more positives versus negatives daily, decrease her procrastination, refrain from assuming worst-case scenarios, challenge and counteract her  self-doubt, avoid making assumptions about what others may be thinking and instead ask them, and recognize the strength she can show when working with goal-directed behaviors to move in a direction that supports her improved emotional health and her  Goal review and progress/challenges noted with patient.  Next appt within 2 weeks.   [name], LCSW

## 2024-03-02 ENCOUNTER — Ambulatory Visit (INDEPENDENT_AMBULATORY_CARE_PROVIDER_SITE_OTHER): Admitting: Psychiatry

## 2024-03-02 DIAGNOSIS — F411 Generalized anxiety disorder: Secondary | ICD-10-CM

## 2024-03-02 NOTE — Progress Notes (Signed)
 [facility] Counselor/Therapist Progress Note  Patient ID: Vanessa Doyle, MRN: [id number],    Date: 03/02/2024  Time Spent: 55 minutes   Treatment Type: Individual Therapy  Reported Symptoms: anxiety, depression decreasing, obsessive thoughts decreasing, procrastination improving, frustrated, overwhelmedness improved some   Mental Status Exam:  Appearance:   Casual     Behavior:  Appropriate, Sharing, and Motivated  Motor:  Normal  Speech/Language:   Clear and Coherent  Affect:  Depressed and anxious  Mood:  anxious and depressed  Thought process:  goal directed  Thought content:    Rumination  Sensory/Perceptual disturbances:    WNL  Orientation:  oriented to person, place, time/date, situation, day of week, month of year, year, and stated date of March 02, 2024  Attention:  Good  Concentration:  Good and Fair  Memory:  WNL  Fund of knowledge:   Good  Insight:    Good  Judgment:   Good  Impulse Control:  Good   Risk Assessment: Danger to Self:  No Self-injurious Behavior: No Danger to Others: No Duty to Warn:no Physical Aggression / Violence:No  Access to Firearms a concern: No  Gang Involvement:No   Subjective: Patient today reporting anxiety, obsessive thoughts, procrastination, some depression, frustration, and overwhelmingness. Brought her therapy journal with her into session today and sharing some of her recent writing. Not feeling that she is accomplishing enough. Doesn't have a job in her field of study and that creates a feeling of disappointment for patient, and she states she is working on letting go of the self-negating and limiting thoughts/judgement of herself. Feels embarrassed by where she is in life right now and not working full time at a job in her field. Distinguishing between what is real and what I feel which is often inaccurate.  Feeling caught up at times in negative ways and working to interrupt that and move in a different direction. Not  second-guessing herself as much. Looking backwards at times at How I give up so easily but I know I can do difficult things and I need to believe more in myself. Voicing lots of insight. Was good at taking care of others but not so much myself. Feeling more deserving  for herself. Does voice anxiety about making the same mistakes again, and working to believe more in herself that she can manage her mistakes better and not overly focus on what other think about how I should be. Less second guessing of herself, and aiming to not get stuck in negative feelings. Realizing I don't have to always have it together. Tired of being dependent and wanting to be more independent, but wants to also stop blaming. Trying to reduce self-doubt and fears. Frequently second-guesses herself. Reduction in negativity noticeable, and understanding that I can't make all the desired changes at once, and that is ok.  More energy today.  Continues to try and refrain from self negating/blaming self talk.  Working to increase her self acceptance and helping patient understand the importance of gradual improvement, better management of frustrations, and refraining from self-blaming.  A lot of stress at home currently as family is moving to their new home across town.  Following through on her need to be more intentional and looking more for the things that go right and the things that are positives versus negatives.  Patient reports a decrease in her feeling invalid.  Job search continues.  Working further on adjusting more easily when things do not go as planned  or hopefully work.  Finding it helpful to give less attention to negative old news.  Working to remain more committed to herself, believing in herself more and not getting up when she feels stuck at times while understanding that feeling stuck is not a permanent feeling for her.   Interventions: Cognitive Behavioral Therapy, Solution-Oriented/Positive Psychology,  and Ego-Supportive Long term goal: Stabilize anxiety level while increasing ability to function on a daily basis, as Evidenced by patient being able to function daily without feeling her anxiety level impedes her functioning level.[name] term goal: Increase understanding of beliefs and messages that produce worry and amxiety, as Evidenced by patient clearly stating her improved understanding of negative/anxious thought patterns and how they affect how they lead to worry and anxiety. Strategies: 1)Help patient develop reality-based, positive cognitive messages. 2)Increase daily use of positive self-talk  3) refrain from assuming worst-case scenarios   Diagnosis:   ICD-10-CM   1. Generalized anxiety disorder  F41.1      Plan: Patient more positive and caring of herself today, less self-negating. 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  Some apprehension and discouragement but patient working to not give that a lot of power.  Continues to maintain positives about herself and the changes she is working on.  Journaling has been very helpful most recently.  Trying to focus more on the positives versus negatives and setting of limits to how much she allows herself to get stuck in worrying.  Staying in contact with people that are supportive in her life and paying more attention to positives than negatives which is a work in progress.  Continuing to encourage patient and making good choices.  Vanessa Doyle is showing progress and needs to continue her goal-directed work that supports her moving forward.  Reviewed and encouraged patient and practicing positive and self affirming behaviors in daily life including: Staying in contact with supportive people, refrain from focusing on perceived weaknesses and instead focus on her strengths, look more for the positives versus negatives daily, decrease her procrastination, refrain from assuming worst-case scenarios,  challenge and counteract her self-doubt, avoid making assumptions about what others may be thinking and instead ask them, and realize the strength she shows when working with goal-directed behaviors to move in a direction that supports her overall improved emotional health and outlook.  Goal review and progress/challenges noted with patient.  Next appointment within 2 weeks.   [name], LCSW

## 2024-03-09 ENCOUNTER — Ambulatory Visit: Admitting: Psychiatry

## 2024-03-14 ENCOUNTER — Ambulatory Visit: Admitting: Psychiatry

## 2024-03-21 ENCOUNTER — Ambulatory Visit: Admitting: Psychiatry

## 2024-03-21 DIAGNOSIS — F411 Generalized anxiety disorder: Secondary | ICD-10-CM | POA: Diagnosis not present

## 2024-03-21 NOTE — Progress Notes (Signed)
 [facility] Counselor/Therapist Progress Note  Patient ID: Vanessa Doyle, MRN: [id number],    Date: 03/21/2024  Time Spent: 55 minutes   Treatment Type: Individual Therapy  Reported Symptoms: anxiety, depression decreasing, obsessive thoughts decreasing, procrastination improving, frustrated, overwhelmedness increasing more recently     Mental Status Exam:  Appearance:   Casual and Neat     Behavior:  Appropriate, Rigid, and Motivated  Motor:  Normal  Speech/Language:   Clear and Coherent  Affect:  anxious  Mood:  anxious  Thought process:  goal directed  Thought content:    Rumination  Sensory/Perceptual disturbances:    WNL  Orientation:  oriented to person, place, time/date, situation, day of week, month of year, year, and stated date of March 21, 2024  Attention:  Good  Concentration:  Good  Memory:  WNL  Fund of knowledge:   Good  Insight:    Good  Judgment:   Good  Impulse Control:  Good   Risk Assessment: Danger to Self:  No Self-injurious Behavior: No Danger to Others: No Duty to Warn:no Physical Aggression / Violence:No  Access to Firearms a concern: No  Gang Involvement:No   Subjective:  Patient today in session working further on her obsessive thoughts, procrastination, anxiety, some depression, overwhelmingness, and frustration. States she's feeling very overwhelmed after she and family moved since last appt, and things are still very disorganized which stresses me but is trying to realize also that she made a good move. 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 Frustration at other people and situations, and not feeling heard by parents. Continues to journal but forgot to bring it with her to session today. Overwhelmed with the moving, issues that are just hard to deal with. Worked on these relationship issues in session today especially within family. Some tearfulness which she feels is  related to her stress and moving out of their long-time home and the home in which patient was born (which added some emotional issues for her). Working further on some sadness, adjustments, letting go, believing more in herself, over-thinking. Is not assuming worse case scenarios which she notices is a good change for her.  Some improvement in insight.  A lot of stress going on at home still because she and parents have just moved into a new home and everything is unsettled.  Continues to feel some embarrassment as to where she is in life right now and because of not having a full-time job in her field.  Talked through this more to today and plans to continue using her journaling as she notices that helps when she brings it into sessions.  Catches herself sometimes looking backwards and still getting onto herself for past mistakes but is improving some and focusing more on the present and into the future because that is where she has some power to make changes.  Is showing good effort at trying to believe more in herself, getting out of situations where she feels stuck which is still a challenge but she is acknowledging more that the being stuck does not have to be a permanent situation for her.   Interventions: Cognitive Behavioral Therapy, Solution-Oriented/Positive Psychology, and Ego-Supportive Long term goal: Stabilize anxiety level while increasing ability to function on a daily basis, as Evidenced by patient being able to function daily without feeling her anxiety level impedes her functioning level.[name] term goal: Increase understanding of  beliefs and messages that produce worry and amxiety, as Evidenced by patient clearly stating her improved understanding of negative/anxious thought patterns and how they affect how they lead to worry and anxiety. Strategies: 1)Help patient develop reality-based, positive cognitive messages. 2)Increase daily use of positive self-talk  3) refrain from  assuming worst-case scenarios   Diagnosis:   ICD-10-CM   1. Generalized anxiety disorder  F41.1      Plan: Patient today showing good energy although very tired from recent move with her family into a new home.  A little bit of less self negating, but still needing to work on this.  Is physically tired from the move but mentally showing some good energy today talking about certain changes she is feeling more committed to, although knows that they are difficult changes for her.  Adds how she has always struggled some with changes especially in her environment and the recent move has definitely impacted that even though she knows the move is a good thing.  Overall some less self negating.  Is able to maintain some positives about herself even under a lot of stress.  Continues to use her journaling and reports this is very helpful and she will continue to bring it into sessions.  Working more actively to focus on the positives versus the negatives right now.  Still some tendencies to get stuck in her worrying but is also getting better at identifying when she is doing that and trying to change it.  Staying in contact with friends is helpful.  Also focusing on good choices.  Sherriann Szuch has definitely shown progress and needs to continue her goal-directed work that supports her moving forward and a more positive direction.  Reviewed and encouraged her to keep practicing positive and self affirming behaviors in her daily life including: Staying in contact with supportive people, refrain from focusing on perceived weaknesses and instead focus on her strengths, look more for the positives versus negatives each day, decrease her procrastination, refrain from assuming worst-case scenarios, challenge and counteract her self-doubt, avoid making assumptions about what others may be thinking and instead ask them, and recognize the strengths she shows when working with goal-directed behaviors to move in a direction that  supports her overall improving emotional health and her outlook into the future.  Goal review and progress/challenges noted with patient.  Next appt within 2 weeks.   [name], LCSW

## 2024-03-28 ENCOUNTER — Ambulatory Visit: Admitting: Psychiatry

## 2024-03-28 DIAGNOSIS — F411 Generalized anxiety disorder: Secondary | ICD-10-CM | POA: Diagnosis not present

## 2024-03-28 NOTE — Progress Notes (Signed)
 [facility] Counselor/Therapist Progress Note  Patient ID: Vanessa Doyle, MRN: [id number],    Date: 03/28/2024  Time Spent: 50 minutes   Treatment Type: Individual Therapy  Reported Symptoms:  anxiety,depression decreasing, obsessive thoughts decreasing, procrastination improving, frustrated, overwhelmedness    Mental Status Exam:  Appearance:   Casual     Behavior:  Appropriate, Sharing, and Motivated  Motor:  Normal  Speech/Language:   Clear and Coherent  Affect:  Anxiety and depression  Mood:  anxious, depressed, and irritable  Thought process:  goal directed  Thought content:    WNL  Sensory/Perceptual disturbances:    WNL  Orientation:  oriented to person, place, time/date, situation, day of week, month of year, year, and stated date of Aug. 18, 2025  Attention:  Fair  Concentration:  Fair  Memory:  WNL  Fund of knowledge:   Good  Insight:    Good  Judgment:   Good  Impulse Control:  Good   Risk Assessment: Danger to Self:  No Self-injurious Behavior: No Danger to Others: No Duty to Warn:no Physical Aggression / Violence:No  Access to Firearms a concern: No  Gang Involvement:No   Subjective:  Patient in session today and states she is needing to continue her work on her overwhelmedness and anxiety especially today after moving a lot of her belongings to family's new home last week.  I need to figure out whatever feelings I have toward my brother as patient feels brother is sensing she is treating differently.  There has been a noticeable change recently and patient's feelings about brother and brother's wife.  To add to this, they have recently found out that they are pregnant and expecting a baby and patient admits that she is keeping distance from them when possible although not being outwardly mean to them.  There are some distinct differences between patient and her brother and their temperament and personalities which we have discussed in sessions and also  the personality of brother's wife which plays a factor in all of how patient feels about her relationship with them.  She did some good work on this today, very open, seemed very honest, and it seems to bother her that she is even having these issues.  I encouraged her not to be down on herself and knows that she is not unusual because she is feeling what she feels and that she is showing a lot of strength and admitting she wants help with it and wants to try to figure out what is going on and how she can work through the feelings rather than be embarrassed to admit them.  She was more open and admitting things today in session and I think she saw how helpful that can be.  Patient is to do some journaling homework between now and next week at her next session and we will pick up her work on this at that point.  Not as overwhelmed with moving and feels that her room and area is coming together in a good way and she is just about finished the unpacking part.  No tearfulness today.  Does continue to acknowledge some sadness, adjustments, letting go, trying to believe more in herself, overthinking, and quick to make assumptions (that may or may not be accurate).  Some improved insight seems to be occurring within patient.  Still some overthinking.  Still feels some embarrassment as to where she is in life right now due to her not having a full-time job  in her field.  Discussed this more in detail in session today and again plans to write about this in part of her journaling between sessions.  Acknowledges that she does sometimes look back at past mistakes and dwells on it in the present.  Trying, with some success, to be more actively involved in the present and this has helped her dwelling on past mistakes to some extent.  Encouraged her continuing to work on her belief in herself and that even when she feels stuck at times it does not have to be a permanent situation for her, pointing out that the conversation we  had today was a very open conversation where patient took the risk to be more open about certain things she had not discussed more openly previously.   Interventions: Cognitive Behavioral Therapy, Solution-Oriented/Positive Psychology, and Ego-Supportive Long term goal: Stabilize anxiety level while increasing ability to function on a daily basis, as Evidenced by patient being able to function daily without feeling her anxiety level impedes her functioning level.[name] term goal: Increase understanding of beliefs and messages that produce worry and amxiety, as Evidenced by patient clearly stating her improved understanding of negative/anxious thought patterns and how they affect how they lead to worry and anxiety. Strategies: 1)Help patient develop reality-based, positive cognitive messages. 2)Increase daily use of positive self-talk  3) refrain from assuming worst-case scenarios   Diagnosis:   ICD-10-CM   1. Generalized anxiety disorder  F41.1      Plan:   Patient today working further on her own personal self-esteem, transitional feelings in the midst of family moved to a new house with her parents, and some issues related to feelings about brother and his wife.  As noted above, patient was more open and disclosing in some of this discussion today and seemed to feel more positive about herself as a result.  Will continue working on her specific issues in therapy and encouraging her in looking at certain things in certain situations in different ways which could be helpful for her.  Some decreased self negating by end of session.  Also showed a little more actively her ability to focus more on positives versus negatives.  Not quite as stuck in her worrying.  Wanting to make good choices.  Renleigh Ouellet has made progress and needs to continue her goal-directed work that supports her moving forward in a more positive direction into the future.  Reminded and encouraged patient to keep practicing  positive and self affirming behaviors in her daily life including: Remain in contact with supportive people, refrain from focusing on perceived weaknesses and instead focus on her strengths, look more for the positives versus negatives daily, decrease her procrastination, refrain from assuming worst-case scenarios, challenge and counteract her self-doubt, avoid making assumptions about what others may be thinking and instead asked them, and recognize the strength she shows when working with goal-directed behaviors to move in a direction that supports her overall improving emotional health and her outlook and wellbeing into the future.  Goal review and progress/challenges noted with patient.  Next appointment within 2 weeks.   [name], LCSW

## 2024-04-04 ENCOUNTER — Ambulatory Visit: Admitting: Psychiatry

## 2024-04-04 DIAGNOSIS — F411 Generalized anxiety disorder: Secondary | ICD-10-CM | POA: Diagnosis not present

## 2024-04-04 NOTE — Progress Notes (Signed)
 [facility] Counselor/Therapist Progress Note  Patient ID: LODIE Doyle, MRN: [id number],    Date: 04/04/2024  Time Spent: 55 minutes   Treatment Type: Individual Therapy  Reported Symptoms: anxiety, depression decreasing, obsessive thoughts, procrastination improving, frustrated, overwhelmedness, irritation everything   Mental Status Exam:  Appearance:   Casual and Neat     Behavior:  Appropriate, Sharing, and Motivated  Motor:  Normal  Speech/Language:   Clear and Coherent  Affect:  Depressed and anxious  Mood:  anxious and depressed  Thought process:  goal directed  Thought content:    Rumination  Sensory/Perceptual disturbances:    WNL  Orientation:  oriented to person, place, time/date, situation, day of week, month of year, year, and stated date of Aug. 25, 2025  Attention:  Fair  Concentration:  Fair  Memory:  WNL  Fund of knowledge:   Good  Insight:    Good and Fair  Judgment:   Good and Fair  Impulse Control:  Good   Risk Assessment: Danger to Self:  No Self-injurious Behavior: No Danger to Others: No Duty to Warn:no Physical Aggression / Violence:No  Access to Firearms a concern: No  Gang Involvement:No   Subjective:   Patient showing good motivation and working further on her anxiety, frustration, overwhelmingness, depression, and some obsessive thought patterns. Patient in session today working further on her symptoms of anxiety, overwhelmingness, procrastination, obsessive thinking, and frustration. Is better recognizing when I get myself stressed and all worked up with anxiety and shared a couple recent examples. Feeling positive about some of her changes and insight into her behavior, thoughts, and feelings, in addition to recognizing more her behavior and thoughts and how they are linked. Focusing on her thoughts and decision-making. Some conflicts within family re: schedule, participation in family events, and patient wanting to be more  independent and on my own. Feel like I make excuses at times for not moving on in life and also realizing her lack of self-discipline at times. Doing some good work today on decision-making, increased self-awareness, and working to let go of the negatives and focusing more on developing a healthier sleep and self-care routing. I know I need to focus on 1 day at the time. More open in her thoughts for the future. I'm not at my worst but not where I want to be. Seeing this as more positive. Trying to decrease self-negating, rigid thought patterns, think things through, not as stuck in worrying, and showing some better outlook for herself in moving forward.  Encouraged patient to keep practicing her positive and self affirming behaviors in her daily life including: Stay in contact with supportive people, refrain from focusing on perceived weaknesses and instead focus on her strengths, look more for the positives versus negatives each day, decrease her procrastination, refrain from assuming worst-case scenarios, challenge and counteract her self-doubt, avoid making assumptions about what others may be thinking and instead ask them, and realize the strengths she shows as she works with goal-directed behaviors to move in a direction that best supports her overall improving emotional health and her outlook and wellbeing moving forward into the future.  Vanessa Doyle continues to make progress and work hard in sessions as, as she needs to continue her work with goal-directed behaviors that supports her moving forward in a positive direction.   Interventions: Cognitive Behavioral Therapy, Solution-Oriented/Positive Psychology, and Ego-Supportive Long term goal: Stabilize anxiety level while increasing ability to function on a daily basis, as Evidenced by  patient being able to function daily without feeling her anxiety level impedes her functioning level.Vanessa Doyle term goal: Increase understanding of beliefs and  messages that produce worry and amxiety, as Evidenced by patient clearly stating her improved understanding of negative/anxious thought patterns and how they affect how they lead to worry and anxiety. Strategies: 1)Help patient develop reality-based, positive cognitive messages. 2)Increase daily use of positive self-talk  3) refrain from assuming worst-case scenarios    Diagnosis:   ICD-10-CM   1. Generalized anxiety disorder  F41.1      Plan:  Patient today in session and working well on issues as noted above.  Worked much more openly today and showed increased motivation which she also recognized and seemed to feel good about.  Encouraged not to be-rate herself and that did occur less often in session today.  Showing good strength and determination and wanting to follow through and some of the constructive changes she has talked about in session today, especially how some of the issues related to better self motivation, improving family communication, and helping maintain a family schedule where commitments are on a calendar that can contribute to more effective family planning.  Continues to do some journaling between sessions which is very helpful.  Working on trying to believe in herself more, decrease her overthinking, being able to let go more easily and not be so quick to make assumptions and instead check things out with other people.  Insight improving some.  Working to interrupt more of her overthinking.  Less dwelling on the past and more looking in the present and into the future.  Encouraging patient to continue working with her goal-directed behaviors and having a better belief in herself that even in hard times or in getting stuck, she is feeling more confident in herself to be able to work through situations.  Insight improving.  Goal review and progress/challenges noted with patient.  Next appointment within 2 weeks.   [name], LCSW

## 2024-04-13 ENCOUNTER — Ambulatory Visit: Admitting: Psychiatry

## 2024-04-13 DIAGNOSIS — F411 Generalized anxiety disorder: Secondary | ICD-10-CM | POA: Diagnosis not present

## 2024-04-13 NOTE — Progress Notes (Signed)
 [facility] Counselor/Therapist Progress Note  Patient ID: Vanessa Doyle, MRN: [id number],    Date: 04/13/2024  Time Spent: 53 minutes   Treatment Type: Individual Therapy  Reported Symptoms:   anxiety, exhaustion, some depression, procrastination improving, overwhelmedness, obsessive thoughts    Mental Status Exam:  Appearance:   Casual     Behavior:  Appropriate, Sharing, and Motivated  Motor:  Normal  Speech/Language:   Clear and Coherent  Affect:  Depressed and anxious  Mood:  anxious and depressed  Thought process:  goal directed  Thought content:    Rumination  Sensory/Perceptual disturbances:    WNL  Orientation:  oriented to person, place, time/date, situation, day of week, month of year, year, and stated date of Sept. 3, 2025  Attention:  Fair  Concentration:  Fair  Memory:  WNL  Fund of knowledge:   Good  Insight:    Good and Fair  Judgment:   Good  Impulse Control:  Good and Fair   Risk Assessment: Danger to Self:  No Self-injurious Behavior: No Danger to Others: No Duty to Warn:no Physical Aggression / Violence:No  Access to Firearms a concern: No  Gang Involvement:No   Subjective: Patient in session today motivated as she works on her anxiety, depression, overwhelmedness, frustrations, and obsessiveness. Sharing and discussing her look for job, and had a recent job interview which she hopes to hear something positive. Today focusing more on her dealing with waiting, and not having certain hours at job.  Biggest obstacle right now in not assuming the worst case scenario. Working on my problems of saying No and not assuming the negatives vs positives. Also working on being able to let go of certain stressors, uncertainties, and ambivalence in her life, and be able  to live more in the moment and have the self-confidence she wants/needs going forward. Also working on letting go and not picking the negatives back up which is taking a lot of  effort.  Worked on herself negating as well as excepting things that we cannot control or change.  Is able to notice some positives in terms of her insight, behavior, thoughts, and feelings.  Some conflicts remain within the family and shared more today.  Notes again how sometimes I make excuses for not moving on in life and I do realize at times my lack of self-discipline.  Talked further about how this is some of what she is feeling currently especially after recent job interview.  Worked some on her own self-awareness and being able to let go of negatives real and perceived. Continued encouragement to work on developing healthier sleep pattern and self-care routines.  Reminded herself in session to focus on 1 day at the time.  And trying not to get stuck in negativity and worrying, particularly since her job interview.  Continued work with patient on her rigid thought patterns, trying to think things through more, trying to decrease self negating even as she talks in session today and using specific examples which seem to help for the patient.  Encouraged patient in her practice of more positive and self affirming behaviors daily including: Remain in contact with supportive people, refrain from focusing on perceived weaknesses and instead focus on her strengths, look more for the positives versus negatives daily, decrease her procrastination, refrain from assuming worst-case scenarios, challenge and counteract her self-doubt, avoid making assumptions about what others may be thinking and instead ask them, and recognize the strengths she shows working with goal-directed  behaviors to move in a direction that best supports her overall improving emotional health and outlook into the future.  Vanessa Doyle does continue to make progress and works hard in sessions and outside of sessions, and she is aware that she needs to continue this work to support her moving forward and a positive a healthier direction.     Interventions: Cognitive Behavioral Therapy, Solution-Oriented/Positive Psychology, and Ego-Supportive Long term goal: Stabilize anxiety level while increasing ability to function on a daily basis, as Evidenced by patient being able to function daily without feeling her anxiety level impedes her functioning level.Vanessa Doyle term goal: Increase understanding of beliefs and messages that produce worry and amxiety, as Evidenced by patient clearly stating her improved understanding of negative/anxious thought patterns and how they affect how they lead to worry and anxiety. Strategies: 1)Help patient develop reality-based, positive cognitive messages. 2)Increase daily use of positive self-talk  3) refrain from assuming worst-case scenarios    Diagnosis:   ICD-10-CM   1. Generalized anxiety disorder  F41.1      Plan: Patient working on issues as stated above in session today and seem to get stronger the more she worked.  Did encourage her to stop berating herself and instead look at herself and healthier ways even with some challenges.  Showing good strength and determination but also tired (some from lack of sleep recently) and a lot of worrying about her recent job interview.  Less family conflicts in the past week.  Encouraged to interrupt her overthinking.  Continue to encourage patient and working with goal-directed behaviors and having more belief in herself even during the times that are more challenging or when she feels stuck at times.  Patient states she is feeling more confident at times and herself and feels that she is learning better to work through situations and improving her anxiety.  Goal review and progress/challenges noted with patient.  Next appointment within 2 weeks.   [name], LCSW

## 2024-04-18 ENCOUNTER — Ambulatory Visit: Admitting: Psychiatry

## 2024-04-18 DIAGNOSIS — F411 Generalized anxiety disorder: Secondary | ICD-10-CM | POA: Diagnosis not present

## 2024-04-18 NOTE — Progress Notes (Signed)
 [facility] Counselor/Therapist Progress Note  Patient ID: Vanessa Doyle, MRN: [id number],    Date: 04/18/2024  Time Spent: 53 minutes   Treatment Type: Individual Therapy  Reported Symptoms: anxiety improved some, depression some, procrastination improving, obsessive thoughts decreasing some, overwhelmedness decreased.      Mental Status Exam:  Appearance:   Neat     Behavior:  Appropriate, Sharing, and Motivated  Motor:  Normal  Speech/Language:   Clear and Coherent  Affect:  anxious  Mood:  anxious and some depression  Thought process:  goal directed  Thought content:    Some ruminating  Sensory/Perceptual disturbances:    WNL  Orientation:  oriented to person, place, time/date, situation, day of week, month of year, year, and stated date of Sept. 8, 2025  Attention:  Fair  Concentration:  Fair  Memory:  WNL  Fund of knowledge:   Good  Insight:    Good and Fair  Judgment:   Good  Impulse Control:  Good   Risk Assessment: Danger to Self:  No Self-injurious Behavior: No Danger to Others: No Duty to Warn:no Physical Aggression / Violence:No  Access to Firearms a concern: No  Gang Involvement:No   Subjective:  Patient today motivated and continues working on her anxiety, some depression, managing frustrations, overwhelmedness, and obsessiveness.  Still has not heard from job interview but has been told she will hear by tomorrow. Not quite as anxious about the outcome right now and trying to work with her thoughts and be ok if I don't get the job. Recently got more sleep and trying to have more consistent healthy sleep pattern. Wanted to focus today further on her anxiety management and how she tends to negatively labels herself but is following up on some of her goals and is shortening the time that she hangs on to the negative thoughts. Looked at her tendency to imaging worst case scenarios. Difficulty saying no as needed and some over-committing and trying to  catch herself earlier from over-committing and having healthy boundaries, all of which is challenging for patient. Practiced some of the changes some has been working on related to herself, self-esteem, self-image, being able to say no, setting healthier limits. I am better about not being stuck in the past, giving a couple examples of how this is happening for her now. Self-negating overall is decreasing some. Acknowledging some of her progress and how it impacts her mood. Increased self-awareness.  Continues to work on self-awareness issues and trying not to jump to the negative.  Also trying to stay out of worry cycles.  Continue to encourage her in her practice of more positive and self affirming behaviors each day including: Staying in contact with supportive people, refrain from focusing on perceived weaknesses and instead focus on her strengths, look for more positives versus negatives, decrease her procrastination, refrain from assuming worst-case scenarios, challenge and counteract her self-doubt, avoid making assumptions about what others may be thinking and instead ask them, and realize the strength she shows when working with goal-directed behaviors to move in a direction that best supports her overall improved emotional health and her outlook into the future.  Vanessa Doyle is continuing to make progress and showing good work in sessions and outside of sessions, as she is aware she needs to continue her work that supports her moving forward in a positive and healthier direction into the future     Interventions: Cognitive Behavioral Therapy, Solution-Oriented/Positive Psychology, and Insight-Oriented Long term goal:  Stabilize anxiety level while increasing ability to function on a daily basis, as Evidenced by patient being able to function daily without feeling her anxiety level impedes her functioning level.[name] term goal: Increase understanding of beliefs and messages that produce  worry and amxiety, as Evidenced by patient clearly stating her improved understanding of negative/anxious thought patterns and how they affect how they lead to worry and anxiety. Strategies: 1)Help patient develop reality-based, positive cognitive messages. 2)Increase daily use of positive self-talk  3) refrain from assuming worst-case scenarios   Diagnosis:   ICD-10-CM   1. Generalized anxiety disorder  F41.1       Plan: Patient continues to show good motivation as she works in sessions showing more strength and determination and goal-directed behaviors.  Has decreased the tendency to be rate herself and be able to see herself in healthier ways.  Less family conflicts in the past week.  Continue to work on her overthinking with some success.  More belief in herself even and challenging times.  Trying to work through stressful situations more than letting them overwhelm her, while also improving her management of anxiety.  Goal review and progress/challenges noted with patient.  Next appointment within 2 weeks.   [name], LCSW

## 2024-04-26 ENCOUNTER — Ambulatory Visit: Admitting: Psychiatry

## 2024-04-26 DIAGNOSIS — F411 Generalized anxiety disorder: Secondary | ICD-10-CM | POA: Diagnosis not present

## 2024-04-26 NOTE — Progress Notes (Signed)
 [facility] Counselor/Therapist Progress Note  Patient ID: Vanessa Doyle, MRN: [id number],    Date: 04/26/2024  Time Spent:  50 minutes  Treatment Type: Individual Therapy  Reported Symptoms:  anxiety (especially about job application), some depression, some procrastination but improving, obsessive thoughts although are decreasing some, overwhelmedness decreasing   Mental Status Exam:  Appearance:   Casual and Neat     Behavior:  Appropriate, Sharing, and Motivated  Motor:  Normal  Speech/Language:   Clear and Coherent  Affect:  Depressed and anxiety  Mood:  anxious and depressed  Thought process:  goal directed  Thought content:    Rumination  Sensory/Perceptual disturbances:    WNL  Orientation:  oriented to person, place, time/date, situation, day of week, month of year, year, and stated date of Sept 16, 2025  Attention:  Fair  Concentration:  Fair  Memory:  WNL  Fund of knowledge:   Good  Insight:    Good and Fair  Judgment:   Good  Impulse Control:  Good   Risk Assessment: Danger to Self:  No Self-injurious Behavior: No Danger to Others: No Duty to Warn:no Physical Aggression / Violence:No  Access to Firearms a concern: No  Gang Involvement:No   Subjective:  Today patient in session and working further on managing frustrations, some depression, anxiety, overwhelmedness, and some rumination. Not heard back on recent job interview and the date of follow up has passed, so patient is anxious over not hearing anything from it. Is trying to work through her frustration and some self-doubt particularly related to not having steady job currently. Also stressed because I know I'm not getting enough rest and is tending to beat up on myself emotionally due to job search. 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 Worked in session on better self-care, setting  healthy limits/boundaries, improved management of stress, and being able to say no when needed. Acknowledging her need to make changes in order to have some time for herself and was able to eventually focus on this with more intentionality, which patient work on in session today including some self-care steps that can help her be healthier emotionally, mentally and physically.  Review of some strategies from last session discussed again and emphasized with patient.  Recognizes some of her self sabotaging and trying to work through this.  Reviewed some positives and not some positives and how she can lean more on her positive behaviors to make more progress and manage situations more effectively.  Continues working on self-esteem, self image, and setting healthier limits.  Also trying to decrease her worrying cycles .  Encouraged to take advantage of opportunities to be with friends that are healthy for her and not isolate at home.  Encouraged patient in practicing more positive and self affirming behaviors daily including staying in contact with supportive people, refrain from focusing on perceived weaknesses and instead focus on her strengths, look for more positives versus negatives, decrease her procrastination, refrain from assuming worst-case scenarios, challenge and counteract her self-doubt, avoid making assumptions about what others may be thinking about her, and recognize the strength she has when working with goal-directed behaviors to move in a direction that best supports her overall improved emotional health as well as her outlook into the future.  Vanessa Doyle is making some progress, with some ups and downs emotionally, and continues her work in an  outside of sessions to support her moving forward in a positive and healthier direction into her future   Interventions: Cognitive Behavioral Therapy, Solution-Oriented/Positive Psychology, and Ego-Supportive Long term goal: Stabilize anxiety  level while increasing ability to function on a daily basis, as evidenced by patient being able to function daily without feeling her anxiety level impedes her functioning level.Vanessa Doyle term goal: Increase understanding of beliefs and messages that produce worry and amxiety, as Evidenced by patient clearly stating her improved understanding of negative/anxious thought patterns and how they affect how they lead to worry and anxiety. Strategies: 1)Help patient develop reality-based, positive cognitive messages. 2)Increase daily use of positive self-talk  3) refrain from assuming worst-case scenarios   Diagnosis:   ICD-10-CM   1. Generalized anxiety disorder  F41.1      Plan:   Vanessa Doyle does show some good motivation at times and more recently has had a bit of a lowered time which seems to be more related to not hearing back from a job interview and we focused on this some in session today.  She was not as self-defeating today overall but did struggle some in parts of the session related to herself judging and self-esteem.  To continue working with her overthinking and developing more of an ability to let go of situations and thoughts as needed.  No significant family conflicts recently since last session.  Patient is working to have more belief in herself and particularly when times are more challenging.  Also working to decrease her overthinking.  Working through better managing stress and situations that typically involve other people or disappointments.  Goal review and progress/challenges noted with patient.  Next appointment within 2 weeks.   [name], LCSW

## 2024-05-02 ENCOUNTER — Ambulatory Visit: Admitting: Psychiatry

## 2024-05-10 ENCOUNTER — Ambulatory Visit: Admitting: Psychiatry

## 2024-05-10 DIAGNOSIS — F411 Generalized anxiety disorder: Secondary | ICD-10-CM

## 2024-05-10 NOTE — Progress Notes (Signed)
 [facility] Counselor/Therapist Progress Note  Patient ID: Vanessa Doyle, MRN: [id number],    Date: 05/10/2024  Time Spent: 60 minutes   Treatment Type: Individual Therapy  Reported Symptoms: anxiety, some depression,  obsessive thoughts with some decreasing, overwhelmedness    Mental Status Exam:  Appearance:   Casual and Neat     Behavior:  Appropriate, Sharing, and Motivated  Motor:  Normal  Speech/Language:   Clear and Coherent  Affect:  Depressed and anxious  Mood:  angry, anxious, depressed, and irritable  Thought process:  goal directed  Thought content:    Obsessions and Rumination  Sensory/Perceptual disturbances:    WNL  Orientation:  oriented to person, place, time/date, situation, day of week, month of year, year, and stated date of Sept. 30, 2025  Attention:  Good  Concentration:  Good and Fair  Memory:  WNL  Fund of knowledge:   Good  Insight:    Good and Fair  Judgment:   Good and Fair  Impulse Control:  Good and Fair   Risk Assessment: Danger to Self:  No Self-injurious Behavior: No Danger to Others: No Duty to Warn:no Physical Aggression / Violence:No  Access to Firearms a concern: No  Gang Involvement:No   Subjective:   Patient today continues her work on her anxiety, better management of frustrations, overwhelmedness, some depression although that has improved, and ruminating.  More emphasis on decreasing her worrying cycles.  Patient open to trying to spend more time with friends rather than isolating at home.  Anxiety, depression, overwhelmed,  obsessive thoughts. Frustrated and angry and disappointed and irritated that I didn't get the job I applied for recently.  Pt reacted with anger and self-doubt and was glad she had our appt today to process her thoughts/feelings. To have more contact with that office in a different way and wants to act like nothing happened. Talked through her anger and disappointment  in session today, as well as  steps she can take in moving forward, in which she actively participated. Struggling with not letting the job turn down define her going forward. Difficulty prioritizing and following through. Reports she is getting some better rest. Trying to let go more of the past, and release more self-doubt. Trying to not beat up on herself emotionally and recognize more quickly her self-sabotage. Some tearfulness and difficult to interrupt her self-doubt. Needing more time for herself and decision-making, working on some anger today which seemed helpful for her. Looking at options which is hard for her right now. Change can be difficult for patient. Some rigid thoughts and challenged these with patient which seemed helpful to her. Encouraged self-care strategies especially as they relate to setting healthier boundaries and self-image. Encouraged her and working on her goals and practicing more positive and self affirming behaviors each day including remaining in contact with supportive people, refrain from focusing on perceived weaknesses and instead focus on her strengths, look for more positives versus negatives, decrease her procrastination, refrain from assuming worst-case scenarios, challenge and counteract her self-doubt, avoid making assumptions about what others may be thinking about her, and realize the strength she shows when she works with goal-directed behaviors trying to move in a direction that best supports her overall improved emotional health as well as her outlook into the future.  Vanessa Doyle does recognize some of her progress which does occur in between some ups and downs emotionally, and trying not to overly focused on the negatives and rather accentuate the  positives as she continues to work inside and outside of sessions to move forward in a more healthy and positive direction.   Interventions: Cognitive Behavioral Therapy, Solution-Oriented/Positive Psychology, and Ego-Supportive Long  term goal: Stabilize anxiety level while increasing ability to function on a daily basis, as evidenced by patient being able to function daily without feeling her anxiety level impedes her functioning level.[name] term goal: Increase understanding of beliefs and messages that produce worry and amxiety, as Evidenced by patient clearly stating her improved understanding of negative/anxious thought patterns and how they affect how they lead to worry and anxiety. Strategies: 1)Help patient develop reality-based, positive cognitive messages. 2)Increase daily use of positive self-talk  3) refrain from assuming worst-case scenarios   Diagnosis:   ICD-10-CM   1. Generalized anxiety disorder  F41.1      Plan:  Deltha Bernales continues to show some good motivation although some inconsistencies at times and is getting better at recognizing strategies she can use to help her and her motivation and not giving into negative thought patterns.  Less self-defeating but still working on this, along with her overthinking and being able to work more on letting go of negative thoughts and/or situations.  Trying to better manage stress and situations that involve other people and when things do not go the way patient had helped.  Goal review and progress/challenges noted with patient.  Next appointment within 2 weeks.   [name], LCSW

## 2024-05-17 ENCOUNTER — Ambulatory Visit: Admitting: Psychiatry

## 2024-05-17 DIAGNOSIS — F411 Generalized anxiety disorder: Secondary | ICD-10-CM | POA: Diagnosis not present

## 2024-05-17 NOTE — Progress Notes (Signed)
 [facility] Counselor/Therapist Progress Note  Patient ID: Vanessa Doyle, MRN: [id number],    Date: 05/17/2024  Time Spent: 55 minutes   Treatment Type: Individual Therapy  Reported Symptoms: anxiety, some depression, obsessive thoughts decreased, overwhelmedness    Mental Status Exam:  Appearance:   Casual     Behavior:  Appropriate, Sharing, and Motivated  Motor:  Normal  Speech/Language:   Normal Rate  Affect:  Anxious, some depression  Mood:  anxious and depressed  Thought process:  goal directed  Thought content:    Rumination  Sensory/Perceptual disturbances:    WNL  Orientation:  oriented to person, place, time/date, situation, day of week, month of year, year, and stated date of Oct. 7, 2025  Attention:  Fair  Concentration:  Good and Fair  Memory:  WNL  Fund of knowledge:   Good and Fair  Insight:    Good and Fair  Judgment:   Good and Fair  Impulse Control:  Good   Risk Assessment: Danger to Self:  No Self-injurious Behavior: No Danger to Others: No Duty to Warn:no Physical Aggression / Violence:No  Access to Firearms a concern: No  Gang Involvement:No   Subjective:   Patient in appointment today and working further on her anxiety, some depression, better management of overwhelmedness, and frustrations. Still overwhelmed and frustrated mostly related to work issues and less related to family. Likes having her upstairs space at home they moved into recently. Is working on my frustrations related job, wishes, and issues on the job but not wanting to offload my feelings on the job. Needing to process more of some occurrences at work re: patient, what she perceives to be happening versus what is happening especially re: mostly related to work and friendships and relationships and how she is perceiving others as well. Second guessing herself in several ways which we talked through further in session today. Easy to postpone and procrastinate which we  worked on along with her self-esteem issues presented today. Tearfulness and frustration shared which seem to help patient eventually focus more of her sense of direction especially related to her own personal issues and securing better employment. (Not all details included in this note due patient privacy needs.) Anger at self processed more today, which seemed helpful to patient. Recommended she pick back up on the journaling she was previously doing and bring into next session. Continues work on letting go of past and trying to move forward in more positive ways. Trying to decrease self-sabotage, letting go of the past, not beat up on self emotionally, and decrease her self-doubt. Changes are very difficult for patient. Continue to encourage better self-care, and maintain healthy boundaries, while working further on her self-image and self-esteem.   Interventions: Cognitive Behavioral Therapy, Solution-Oriented/Positive Psychology, and Ego-Supportive Long term goal: Stabilize anxiety level while increasing ability to function on a daily basis, as evidenced by patient being able to function daily without feeling her anxiety level impedes her functioning level.[name] term goal: Increase understanding of beliefs and messages that produce worry and amxiety, as Evidenced by patient clearly stating her improved understanding of negative/anxious thought patterns and how they affect how they lead to worry and anxiety. Strategies: 1)Help patient develop reality-based, positive cognitive messages. 2)Increase daily use of positive self-talk  3) refrain from assuming worst-case scenarios  Diagnosis:   ICD-10-CM   1. Generalized anxiety disorder  F41.1      Plan:  Encouraged patient to continue working on her  goals and practicing more positive and self affirming behaviors daily including staying in contact with supportive people, refrain from focusing on perceived weaknesses and instead focus on her  strengths, look for more positives versus negatives, decrease her procrastination, refrain from assuming worst-case scenarios, challenge and counteract her self-doubt, avoid making assumptions about what others may be thinking about her, and realize the strength she shows when she works with goal-directed behaviors trying to move in a direction that best supports her overall improved emotional health as well as her outlook into the future.  Allahna Husband does recognize some progress which seems to occur in between frequent ups and downs emotionally and easily tripped up when things do not go as planned.  Tendency to focus on the negatives rather than accentuate more positives as patient continues to work inside and outside of sessions to move in a more healthy and positive direction. Does need to put forth more effort and follow-through and inconsistency with goal related behaviors.  At times can be less self-defeating but this is still a working progress along with working on her overthinking and being able to let go of negative thoughts rather than obsess over them.  Is trying to better manage stress particularly and interactions that involve family or other people and particularly when things do not go the way she had hoped they would go.  Less self-defeating but still working on this, along with her overthinking and being able to work more on letting go of negative thoughts and/or situations.    Goal review and progress/challenges noted with patient.  Next appointment within 2 weeks.   [name], LCSW

## 2024-05-23 ENCOUNTER — Ambulatory Visit: Admitting: Psychiatry

## 2024-05-23 DIAGNOSIS — F411 Generalized anxiety disorder: Secondary | ICD-10-CM | POA: Diagnosis not present

## 2024-05-23 NOTE — Progress Notes (Signed)
 [facility] Counselor/Therapist Progress Note  Patient ID: Vanessa Doyle, MRN: [id number],    Date: 05/23/2024  Time Spent: 55 minutes   Treatment Type: Individual Therapy  Reported Symptoms: anxiety decreased some, some depression, obsessive thought decreased, overwhelmedness     Mental Status Exam:  Appearance:   Casual     Behavior:  Appropriate, Rigid, and Motivated  Motor:  Normal  Speech/Language:   Clear and Coherent  Affect:  Anxious, depressed  Mood:  anxious and depressed  Thought process:  goal directed  Thought content:    Rumination  Sensory/Perceptual disturbances:    WNL  Orientation:  oriented to person, place, time/date, situation, day of week, month of year, year, and stated date of Oct. 13,  2025  Attention:  Fair  Concentration:  Fair  Memory:  WNL  Fund of knowledge:   Good  Insight:    Good and Fair  Judgment:   Good and Fair  Impulse Control:  Good   Risk Assessment: Danger to Self:  No Self-injurious Behavior: No Danger to Others: No Duty to Warn:no Physical Aggression / Violence:No  Access to Firearms a concern: No  Gang Involvement:No   Subjective:  Patient today working further on her anxiety, depression, better management when overwhelmed, and working through frustrations. Some decrease in overwhelmedness and frustration and states work, family, and friends are the areas where I'm struggling with more and my negative thoughts.  Friends are more of an issue recently, was in a wedding this past weekend that was stressful, and she talked through her stressors related to the wedding weekend. (Not all details included in this note due to patient privacy needs.) Did share that she tried to make the best of the situation but had some challenges. Working on not worrying about what others may or may not be thinking.  Trying to better manage frustrations re: job, personal relationships, and within family. Impatient with some friendships and not  wanting to be just a sounding. Does wonder if she needs to be back on some meds as her frustration, tendency to want to remain in bed, negative thoughts, but denies any thoughts of harming self. Again looking further at how she perceives others and situations versus how things really are. Easy to procrastinate and doubting herself. Less tearfulness. Notes that she is going to try and get to bed earlier this week as it should be more of a normal week. Layers of concern in some of her relationships which she shared and processed some today, and will pick up again next session as we ran out of time today. Less angry with herself. Recommend more journaling when able. Self-image and self-esteem around a 4.5 due to my feeling that I'm struggling with the goals and pressure I put on myself. Plan to set aside some time for myself to do some self-care.  Decrease my own self-sabotage.  Continue work on better self-care, moving forward, feel more positive, and decrease self-doubt.   Interventions: Cognitive Behavioral Therapy, Solution-Oriented/Positive Psychology, and Ego-Supportive Long term goal: Stabilize anxiety level while increasing ability to function on a daily basis, as evidenced by patient being able to function daily without feeling her anxiety level impedes her functioning level.[name] term goal: Increase understanding of beliefs and messages that produce worry and amxiety, as Evidenced by patient clearly stating her improved understanding of negative/anxious thought patterns and how they affect how they lead to worry and anxiety. Strategies: 1)Help patient develop reality-based, positive cognitive  messages. 2)Increase daily use of positive self-talk  3) refrain from assuming worst-case scenarios    Diagnosis:   ICD-10-CM   1. Generalized anxiety disorder  F41.1      Plan:  Patient encouraged in session today to continue working on her goals including her practicing more positive and self  affirming behaviors daily, staying in contact with supportive people, refrain from focusing on perceived weaknesses and instead focus on her strengths, look for more positives versus negatives, decrease her procrastination, refrain from assuming worst-case scenarios, challenge and counteract her self-doubt, avoid making assumptions about what others may be thinking about her, and recognize the strength she shows when she works with goal-directed behaviors trying to move in a direction that best supports her overall improved emotional health as well as her goal-directed behaviors focused on helping her move in a direction that best supports her overall improved emotional health and her outlook into the future.  Bebe Moncure has recognized her progress and also notes how it can be inconsistent between frequent ups and downs emotionally and her tendency to sometimes overreact and easily tripped up when things do not go as planned.  Often focusing on the negatives rather than accentuate more of her positives and worked on this especially today with patient while encouraging her to work inside and outside of sessions to move in a more healthy and positive direction.  Encouraged patient and her need to put forth more effort and follow through and working on her goal-directed behaviors.  Also trying to be less self-defeating and this is a working progress and closely connected to her overthinking and her need to be able to let go of negative thoughts rather than obsessing over them.  Notes that she is working to better manage stress especially in interactions that involve family, and especially when things do not go the way she had hoped they would go.  Reports that she feels she is less self-defeating and this is a working progress in addition to her overthinking and being able to work more on letting go of negative thoughts.  Goal review and progress/challenges noted with patient.  Next appointment within 2  weeks.   [name], LCSW

## 2024-05-30 ENCOUNTER — Ambulatory Visit: Admitting: Psychiatry

## 2024-05-30 DIAGNOSIS — F411 Generalized anxiety disorder: Secondary | ICD-10-CM | POA: Diagnosis not present

## 2024-05-30 NOTE — Progress Notes (Signed)
 [facility] Counselor/Therapist Progress Note  Patient ID: Vanessa Doyle, MRN: [id number],    Date: 05/30/2024  Time Spent: 55 minutes   Treatment Type: Individual Therapy  Reported Symptoms: anxiety decreased some, obsessive thoughts decreased, some depression, overwhelmedness   Mental Status Exam:  Appearance:   Casual     Behavior:  Appropriate, Sharing, and Motivated  Motor:  Normal  Speech/Language:   Normal Rate  Affect:  Depressed and anxious  Mood:  anxious and depressed  Thought process:  goal directed  Thought content:    Rumination  Sensory/Perceptual disturbances:    WNL  Orientation:  oriented to person, place, time/date, situation, day of week, month of year, year, and stated date of Oct. 20, 2025  Attention:  Fair  Concentration:  Fair  Memory:  WNL  Fund of knowledge:   Good  Insight:    Good and Fair  Judgment:   Fair  Impulse Control:  Good   Risk Assessment: Danger to Self:  No Self-injurious Behavior: No Danger to Others: No Duty to Warn:no Physical Aggression / Violence:No  Access to Firearms a concern: No  Gang Involvement:No   Subjective:  Patient today continuing her work on depression, anxiety, frustration, and managing overwhelmedness. Working further today on relationships with family, work, and friends where more of her struggles and negative thoughts are more challenging. I dread going into things and tend to avoid when possible rather than confronting issues. Hard to really confront things and I know my thinking is not always accurate. Sometimes recognizes her thoughts/fears are likely not always accurate and tends to try to avoid confronting them. Finds that motivation has decreased some, and tearfulness happening more often sometimes explained and sometimes not fully understanding why I feel like I'm feeling, more sad and denies any SI. Tearfulness, frustrated but talking better throughout session today. Some relationship issues  within the family especially with brother and wife who are expecting their first baby in November. Does have med eval on Nov. 7th. Encouraged to be in more contact with a couple friends and to practice some of the activities she enjoys more including barre exercises, reading, and writing in her journaling. Concerned about getting job into the future and eventually be able to get out on her own. Trying to worry less about what others think of her. Procrastinaion. Self-doubt. I wonder more if I do need to be back on meds. Easy to mis-perceive others and situations. Over-sleeping at times, negative thoughts. Relationship/friendship concerns and remains more avoidant. Some anger with herself for feeling I'm not being a productive person. Encouraged journaling again as that has been helpful to her at times. Working on [employer]. Also encouraged time with family at home when she feels it would be helpful. Continues her efforts to decrease her self-negating and self-doubt.     Interventions: Cognitive Behavioral Therapy, Solution-Oriented/Positive Psychology, and Insight-Oriented Long term goal: Stabilize anxiety level while increasing ability to function on a daily basis, as evidenced by patient being able to function daily without feeling her anxiety level impedes her functioning level.[name] term goal: Increase understanding of beliefs and messages that produce worry and anxiety, as Evidenced by patient clearly stating her improved understanding of negative/anxious thought patterns and how they affect how they lead to worry and anxiety. Strategies: 1)Help patient develop reality-based, positive cognitive messages. 2)Increase daily use of positive self-talk  3) refrain from assuming worst-case scenarios    Diagnosis:   ICD-10-CM   1.  Generalized anxiety disorder  F41.1      Plan:  Patient showing some good motivation and drive today as she confronts pertinent issues to her  progress or lack of progress and some goal areas.  Showing some movement in terms of self-care, although this is very gradual.  Denies any thoughts to harm herself or others.  Look today at some ways that she can make some changes and might help her feel more connected.  Does seem to be having some significant depression which she did not acknowledge initially but has more recently and is lined up to see a med provider for an evaluation in early November.  Encouraged some contact with certain people that she seems to feel more comfortable with in terms of friends and family and also to refrain from the self judgment.  Trying to be less self-defeating, to let go of some of her overthinking and negative thoughts rather than dwelling on them.  Has been a little more open with her parents recently, with whom she lives.  Trying not to rethink as much and trying not to take her thoughts in a negative direction and make negative assumptions about how others feel about her.  Patient today continuing to work on her goals and her practice of more positive and self affirming behaviors each day, stay in contact with supportive people, refrain from focusing on perceived weaknesses and instead focus on her strengths, looking for more positives versus negatives, decrease her procrastination, refrain from assuming worst-case scenarios, challenge and counteract her self-doubt, avoid making assumptions about what others may be thinking about her, and recognize the strengths she shows when working with goal-directed behaviors trying to move in a direction that best supports her overall improved emotional health as well as behaviors that help her move in a direction that best supports her outlook and emotional health into the future.  Pami Wool has recognized her progress, is aware of some inconsistency at times and frequent ups and downs however there has been some decrease in her sometimes overreacting and getting easily tripped  up when things do not go as planned.  Even that bit of improvement is important for this patient.  Goal review and progress/challenges noted with patient.  Next appointment within 2 weeks.   [name], LCSW

## 2024-06-06 ENCOUNTER — Ambulatory Visit: Admitting: Psychiatry

## 2024-06-07 ENCOUNTER — Ambulatory Visit: Admitting: Psychiatry

## 2024-06-07 DIAGNOSIS — F411 Generalized anxiety disorder: Secondary | ICD-10-CM

## 2024-06-07 NOTE — Progress Notes (Signed)
 [facility] Counselor/Therapist Progress Note  Patient ID: Vanessa Doyle, MRN: [id number],    Date: 06/07/2024  Time Spent: 53 minutes   Treatment Type: Individual Therapy  Reported Symptoms: anxiety, frustration, some depression, overwhelmedness    Mental Status Exam:  Appearance:   Casual and Neat     Behavior:  Appropriate, Sharing, and Motivated  Motor:  Normal  Speech/Language:   Clear and Coherent  Affect:  Anxious and some depression  Mood:  anxious and some depression  Thought process:  goal directed  Thought content:    Rumination  Sensory/Perceptual disturbances:    WNL  Orientation:  oriented to person, place, time/date, situation, day of week, month of year, year, and stated date of Oct. 28, 2025  Attention:  Fair  Concentration:  Fair  Memory:  WNL  Fund of knowledge:   Good  Insight:    Good and Fair  Judgment:   Good and Fair  Impulse Control:  Good   Risk Assessment: Danger to Self:  No Self-injurious Behavior: No Danger to Others: No Duty to Warn:no Physical Aggression / Violence:No  Access to Firearms a concern: No  Gang Involvement:No    Subjective: Patient in session today working further on her frustration, depression, anxiety, and overwhelmedness.  Reports these symptoms are primarily aggravated by family and work relationships and issues. Continues to have a lot of struggle and negative thoughts at work and at home.  Brother and wife's baby was born this past Friday and that was an adventure, which she explained more of what she meant and how things played out with family. A little more understanding of brother and his wife's situation, but I've had a lot of feelings these past few days. Mixed feelings about a lot of things including family, self, and work. Mixed feelings of irritation and anger that she reports are related to: why am I willing to give my time and space for others but not for myself, and see happiness and success in the  same light. Feeling irritated and angry and that I haven't made any progress in having a better job or career and frustrated that I haven't done enough. Denies thoughts to harm herself. I do things because I have to. Looks forward to going to bed and states she is sleeping.  Discussed further thoughts she is having in reference to job, job interviews, and checking in with with person connected with helping people find employment.  Trying to worry less about what others think of her.  Very easy for patient to mis-perceive how others feel about her.  Procrastination and self-doubt remain.  Did actually acknowledge again today that she wondered if she needed to be back on medications and she does have an appointment with a med provider here within the next 1 to 2 weeks.  Irritability.  Easy to see what might go wrong versus right.  Some anger and blame towards herself.  Is working on decreasing her self sabotaging behaviors.  Encouraged patient to have some downtime at home with family when she is able and not totally isolate from them.  Also encouraging her to work more on her tendency to self-doubt and self negate.     Interventions: Cognitive Behavioral Therapy, Solution-Oriented/Positive Psychology, and Ego-Supportive Long term goal: Stabilize anxiety level while increasing ability to function on a daily basis, as evidenced by patient being able to function daily without feeling her anxiety level impedes her functioning level.[name] term goal: Increase  understanding of beliefs and messages that produce worry and anxiety, as Evidenced by patient clearly stating her improved understanding of negative/anxious thought patterns and how they affect how they lead to worry and anxiety. Strategies: 1)Help patient develop reality-based, positive cognitive messages. 2)Increase daily use of positive self-talk  3) refrain from assuming worst-case scenarios   Diagnosis:   ICD-10-CM   1. Generalized  anxiety disorder  F41.1      Plan:  Patient today showing some increase in her motivation, at least a little bit which is encouraging. But  doesn't believe she deserves good things.  Patient showing a little bit of motivation and I really encouraged her to focus more on her ability to show more motivation because her results will be different I feel like if she does.  Denies any thoughts to harm herself or others.  Physical self-care is relatively good.  Hard for her to really follow through on her goals even though she states they are appropriate goals.  Encouraged contact with some people that I know she feels more comfortable with in terms of friends and family and also to stop herself judging.  Encouraged more belief in herself versus being self-defeating and to let go of some of her overthinking and negative thoughts.  Patient today continues her work on her goals and her practice of more positive and self affirming behaviors including: Staying in contact with supportive people, refrain from focusing on perceived weaknesses and instead focus on her strengths, looking for more positives versus negatives, decrease her procrastination, refrain from assuming worst-case scenarios, challenge and counteract her self-doubt, avoid making assumptions about what others may be thinking about her, and recognize the strengths she shows when working with goal-directed behaviors to help her move in a direction that best supports her overall improved emotional health as well as behaviors that help her move in a direction that supports her outlook into the future.  Myron Lona does recognize her progress and also her challenges (especially more recently), and is aware of some inconsistency at times and frequent ups and downs however has noticed some decrease in her times of overreacting and getting easily tripped up when things do not go as planned.  That type of improvement is important for this patient as she continues to  work on her treatment goals.  Goal review and progress/challenges noted with patient.  Next appointment within 2 weeks.   [name], LCSW

## 2024-06-13 ENCOUNTER — Ambulatory Visit: Admitting: Psychiatry

## 2024-06-13 DIAGNOSIS — F411 Generalized anxiety disorder: Secondary | ICD-10-CM | POA: Diagnosis not present

## 2024-06-13 NOTE — Progress Notes (Signed)
 [facility] Counselor/Therapist Progress Note  Patient ID: Vanessa Doyle, MRN: [id number],    Date: 06/13/2024  Time Spent: 55 minutes   Treatment Type: Individual Therapy  Reported Symptoms:    Anxiety, some depression, overwhelmed but not as bad, frustration    Mental Status Exam:  Appearance:   Casual     Behavior:  Appropriate, Sharing, and some motivation  Motor:  Normal  Speech/Language:   Clear and Coherent  Affect:  Depressed and anxious  Mood:  anxious, depressed, and irritable  Thought process:  goal directed  Thought content:    WNL  Sensory/Perceptual disturbances:    WNL  Orientation:  oriented to person, place, time/date, situation, day of week, month of year, year, and stated date of Nov. 3, 2025  Attention:  Fair  Concentration:  Fair  Memory:  WNL  Fund of knowledge:   Good  Insight:    Good and Fair  Judgment:   Good and Fair  Impulse Control:  Good and Fair   Risk Assessment: Danger to Self:  No Self-injurious Behavior: No Danger to Others: No Duty to Warn:no Physical Aggression / Violence:No  Access to Firearms a concern: No  Gang Involvement:No   Subjective:   Patient working in session today further on her frustration, anxiety, depression, and overall wellness.  Notes again that symptoms are typically aggravated in family and work relationships and I added in talking with her that I noticed how her own talking about herself also negatively impacts her thinking, and patient agreed and worked further on this today. Also shared an issue with brother's baby recently born, and possible immune issues with baby and further testing being done; patient talking through this in session and feels family may be assuming the worst without really knowing details, which a bit later she was able to identify how she herself struggles with the same type of assumptions at times. Today focusing on multiple family situations that are related to some of patient's  current struggles within family. Is less overall depressed today and communicating more directly, with more attention given on what I can control or change versus cannot. Continues to struggle re: job issues and search. To work on more specific behaviors that can help encourage patient in a more positive direction and a more self-caring direction. Mixed feelings of irritation and anger continue but not quite as strong. Trying to worry less and not make negative assumptions because she does not have a regular job.  Working to interrupt negative thoughts in session today and also outside of sessions.  Some tendency to over-blame herself.  Continues efforts to decrease self sabotaging behaviors.  Continue to encourage patient to have more time with family at home and not isolate from them as that can help those relationships improve.   Interventions: Cognitive Behavioral Therapy, Solution-Oriented/Positive Psychology, and Ego-Supportive Long term goal: Stabilize anxiety level while increasing ability to function on a daily basis, as evidenced by patient being able to function daily without feeling her anxiety level impedes her functioning level.[name] term goal: Increase understanding of beliefs and messages that produce worry and anxiety, as Evidenced by patient clearly stating her improved understanding of negative/anxious thought patterns and how they affect how they lead to worry and anxiety. Strategies: 1)Help patient develop reality-based, positive cognitive messages. 2)Increase daily use of positive self-talk  3) refrain from assuming worst-case scenarios   Diagnosis:   ICD-10-CM   1. Generalized anxiety disorder  F41.1  Plan:  Patient in session today working further on her anxiety, some depression and frustration, and trying to improve her overall mood and outlook including the way she views herself.  More motivated today once she got in session and talking.  Continues to deny  any thoughts to harm herself or anyone else.  Some concern with the family issue and a recently born baby, and they are to find out more information this week.  Denies any thoughts to harm herself or others.  Continue to encourage patient to have contact with other people more outside of the home and to refrain from her own self judging and assuming the negatives for herself.  Encouraged patient to work on believing more in herself versus the self-defeating behaviors that have been occurring along with overthinking and negative thoughts.  She continues to work on goals and trying to be more positive and trying to practice more self affirming behaviors including: Remain in contact with supportive people, refrain from focusing on perceived weaknesses and instead focus on her strengths, look for more positives versus negatives, decrease her procrastination, refrain from assuming worst-case scenarios, challenge and counteract her self-doubt, avoid making assumptions about what others may be thinking about her, and recognize the strengths she shows when working with goal-directed behaviors to help her move in a direction that best supports her overall improved emotional health as well as behaviors that help her move in a direction more positive into the future.  Ronnald Lard does recognize her progress and also her challenges, and is aware of inconsistency at times and frequent ups and downs however has also noticed some decrease in her times of overreacting and getting easily tripped up when things do not go as planned.  That type of improvement is important for this patient as she continues to work on her treatment goals and focus more on her strengths versus perceived weaknesses.  Goal review and progress/challenges noted with patient.  Next appointment within 2 weeks.   [name], LCSW

## 2024-06-17 ENCOUNTER — Encounter: Payer: Self-pay | Admitting: Physician Assistant

## 2024-06-17 ENCOUNTER — Ambulatory Visit (INDEPENDENT_AMBULATORY_CARE_PROVIDER_SITE_OTHER): Admitting: Physician Assistant

## 2024-06-17 VITALS — BP 138/72 | HR 82 | Ht 63.0 in | Wt 230.0 lb

## 2024-06-17 DIAGNOSIS — F5105 Insomnia due to other mental disorder: Secondary | ICD-10-CM

## 2024-06-17 DIAGNOSIS — F411 Generalized anxiety disorder: Secondary | ICD-10-CM

## 2024-06-17 DIAGNOSIS — F99 Mental disorder, not otherwise specified: Secondary | ICD-10-CM

## 2024-06-17 DIAGNOSIS — F331 Major depressive disorder, recurrent, moderate: Secondary | ICD-10-CM

## 2024-06-17 DIAGNOSIS — F422 Mixed obsessional thoughts and acts: Secondary | ICD-10-CM

## 2024-06-17 MED ORDER — FLUVOXAMINE MALEATE 50 MG PO TABS: 50.0000 mg | ORAL_TABLET | Freq: Every day | ORAL | 1 refills | Status: DC

## 2024-06-17 NOTE — Progress Notes (Signed)
 [facility] MD/PA/NP Initial Note  06/17/2024 5:28 PM DORTHEA MAINA  MRN:  [id number]  Chief Complaint:  Chief Complaint   Establish Care    HPI:   Tasnim presents to for evaluation and treatment for symptoms of depression.  She has thoughts on a daily basis that she is letting herself down or letting her friends and family down.  She does not feel like she has accomplished anything, at least what she thought she would have by this age.  Feels like a failure sometimes.  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  The job market is tough right now though.  She lives with her parents.  She and her dad are close but not close with her mom.  She has a brother but not close to him either.  Her dad even asked her if she is okay, he can tell her mood is often low.  Has trouble sleeping sometimes, going to sleep and staying asleep.  ADLs and personal hygiene are normal.   No changes in focus, concentration.  No memory deficit.  Appetite has not changed.  Weight is stable.  Denies laxative use, calorie restricting, or binging and purging.  No reports of cutting or any form of self-harm.  No SI/HI.  Things have been really stressful for the past few years.  In 2023 she took care of her grandma who had leukemia.  She also help take care of of her dad after he broke his heel.  All of that caused increased anxiety.  She had panic attacks in the past but more so now is just overwhelmed.  She does have rituals that she follows and has ruminating thoughts.  She is a perfectionist.   No reports of increased energy with decreased need for sleep, increased talkativeness, racing thoughts, impulsivity or risky behaviors, increased spending, increased libido, grandiosity, increased irritability or anger, paranoia, or hallucinations.  She is seeing Marval Bunde, LCSW and is finding benefit in counseling.  Visit Diagnosis:    ICD-10-CM   1. Major depressive disorder, recurrent episode,  moderate (HCC)  F33.1     2. Generalized anxiety disorder  F41.1     3. Mixed obsessional thoughts and acts  F42.2     4. Insomnia due to other mental disorder  F51.05    F99      Past Psychiatric History:   Past medications for mental health diagnoses include: Belsomra  worked the best, Buspar , Temazepam   No psych admissions  Past Medical History:  Past Medical History:  Diagnosis Date   Anxiety    Depression    Episodic tension type headache 11/24/2014   Insomnia    Migraine without aura and without status migrainosus, not intractable 11/24/2014   Migraine without aura and without status migrainosus, not intractable    Vitamin D  deficiency     Past Surgical History:  Procedure Laterality Date   WISDOM TOOTH EXTRACTION     Family Psychiatric History:  See below  Family History:  Family History  Problem Relation Age of Onset   Hypertension Mother    Obesity Mother    Migraines Father    Sleep apnea Father    Healthy Brother    Leukemia Maternal Grandmother    Breast cancer Maternal Grandmother    Heart disease Paternal Grandmother     Social History:  Social History   Socioeconomic History   Marital status: Single    Spouse name: Not on file  Number of children: Not on file   Years of education: Not on file   Highest education level: Not on file  Occupational History   Not on file  Tobacco Use   Smoking status: Never   Smokeless tobacco: Never  Vaping Use   Vaping status: Never Used  Substance and Sexual Activity   Alcohol use: No   Drug use: No   Sexual activity: Never  Other Topics Concern   Not on file  Social History Narrative   Good childhood, never abused   dad works with [employer], lobbyist.    Mom is Production Designer, Theatre/television/film at [employer]   Has an older brother.      Bachelors degree from [employer]   Is a environmental manager, estate agent work, higher education careers adviser.      She lives with her parents.    She enjoys dance, likes to read, works out        Careers Information Officer none   Social Drivers of Corporate Investment Banker Strain: Low Risk  (06/17/2024)   Overall Financial Resource Strain (CARDIA)    Difficulty of Paying Living Expenses: Not hard at all  Food Insecurity: No Food Insecurity (06/17/2024)   Hunger Vital Sign    Worried About Running Out of Food in the Last Year: Never true    Ran Out of Food in the Last Year: Never true  Transportation Needs: No Transportation Needs (06/17/2024)   PRAPARE - Administrator, Civil Service (Medical): No    Lack of Transportation (Non-Medical): No  Physical Activity: Sufficiently Active (06/17/2024)   Exercise Vital Sign    Days of Exercise per Week: 7 days    Minutes of Exercise per Session: 60 min  Stress: Stress Concern Present (06/17/2024)   [employer] of Occupational Health - Occupational Stress Questionnaire    Feeling of Stress: Rather much  Social Connections: Moderately Isolated (06/17/2024)   Social Connection and Isolation Panel    Frequency of Communication with Friends and Family: Once a week    Frequency of Social Gatherings with Friends and Family: Once a week    Attends Religious Services: More than 4 times per year    Active Member of Clubs or Organizations: Yes    Attends Engineer, Structural: More than 4 times per year    Marital Status: Never married    Allergies: No Known Allergies  Metabolic Disorder Labs: Lab Results  Component Value Date   HGBA1C 5.5 03/14/2020   No results found for: PROLACTIN Lab Results  Component Value Date   CHOL 136 12/07/2019   TRIG 67 12/07/2019   HDL 55 12/07/2019   LDLCALC 67 12/07/2019   Lab Results  Component Value Date   TSH 0.788 12/07/2019    Therapeutic Level Labs: No results found for: LITHIUM No results found for: VALPROATE No results found for: CBMZ  Current Medications: Current Outpatient Medications  Medication Sig Dispense Refill   fluvoxaMINE (LUVOX) 50 MG  tablet Take 1 tablet (50 mg total) by mouth at bedtime. 30 tablet 1   acetaminophen  (TYLENOL ) 325 MG tablet Take 2 tablets (650 mg total) by mouth every 6 (six) hours as needed for fever or mild pain.     ibuprofen  (ADVIL ) 200 MG tablet Take 200 mg by mouth every 6 (six) hours as needed.     Melatonin 10 MG CAPS Take 10 capsules by mouth at bedtime as needed. 30 capsule 0   Vitamin D , Ergocalciferol , (DRISDOL ) 1.25  MG (50000 UNIT) CAPS capsule Take 1 capsule (50,000 Units total) by mouth every 7 (seven) days. 4 capsule 0   No current facility-administered medications for this visit.    Medication Side Effects: none  Orders placed this visit:  No orders of the defined types were placed in this encounter.   Psychiatric Specialty Exam:  Review of Systems  Constitutional: Negative.   HENT: Negative.    Eyes: Negative.   Respiratory: Negative.    Cardiovascular: Negative.   Gastrointestinal: Negative.   Endocrine: Negative.   Genitourinary: Negative.   Musculoskeletal: Negative.   Skin: Negative.   Allergic/Immunologic: Negative.   Neurological: Negative.   Hematological: Negative.   Psychiatric/Behavioral:         See HPI    Blood pressure 138/72, pulse 82, height 5' 3 (1.6 m), weight 230 lb (104.3 kg).Body mass index is 40.74 kg/m.  General Appearance: Casual and Well Groomed  Eye Contact:  Good  Speech:  Clear and Coherent and Normal Rate  Volume:  Normal  Mood:  sad  Affect:  Congruent  Thought Process:  Goal Directed and Descriptions of Associations: Circumstantial  Orientation:  Full (Time, Place, and Person)  Thought Content: Logical   Suicidal Thoughts:  No  Homicidal Thoughts:  No  Memory:  WNL  Judgement:  Good  Insight:  Good  Psychomotor Activity:  Normal  Concentration:  Concentration: Good  Recall:  Good  Fund of Knowledge: Good  Language: Good  Assets:  Communication Skills Desire for Improvement Financial  Resources/Insurance Housing Transportation Vocational/Educational  ADL's:  Intact  Cognition: WNL  Prognosis:  Good   Screenings:  PHQ2-9    Flowsheet Row Office Visit from 06/17/2024 in [facility] Office Visit from 12/07/2019 in [facility] Total Score 6 5  PHQ-9 Total Score 16 15   Receiving Psychotherapy: Yes with Marval Bunde, LCSW.  Treatment Plan/Recommendations:   PDMP reviewed.  No controlled substances. I provided approximately 60 minutes of face to face time during this encounter, including time spent before and after the visit in records review, medical decision making, counseling pertinent to today's visit, and charting.   We discussed the depression, OCD, and anxiety.  She prefers not to take any medication if she can help at but has to do something.  I recommend starting Luvox because it helps all 3 of those illnesses.  Will start with a low-dose and increase if needed.  Benefits, risk and side effects were discussed and she accepts.  Start Luvox 50 mg, 1 p.o. nightly. Continue therapy with Marval Bunde, LCSW. Return in 4 to 6 weeks.  Verneita Cooks, PA-C

## 2024-06-22 ENCOUNTER — Ambulatory Visit: Admitting: Psychiatry

## 2024-06-22 DIAGNOSIS — F331 Major depressive disorder, recurrent, moderate: Secondary | ICD-10-CM | POA: Diagnosis not present

## 2024-06-22 NOTE — Progress Notes (Signed)
 [facility] Counselor/Therapist Progress Note  Patient ID: Vanessa Doyle, MRN: [id number],    Date: 06/22/2024  Time Spent: 55 minutes   Treatment Type: Individual Therapy  Reported Symptoms: anxiety, depression, frustration, less overwhelmedness     Mental Status Exam:  Appearance:   Casual     Behavior:  Appropriate, Sharing, and Motivated  Motor:  Normal  Speech/Language:   Clear and Coherent  Affect:  Depressed and anxious  Mood:  anxious and depressed  Thought process:  goal directed  Thought content:    Decreased ruminating  Sensory/Perceptual disturbances:    WNL  Orientation:  oriented to stated date of Nov. 12, 2025  Attention:  Fair  Concentration:  Good  Memory:  WNL  Fund of knowledge:   Good  Insight:    Good  Judgment:   Good  Impulse Control:  Good   Risk Assessment: Danger to Self:  No Self-injurious Behavior: No Danger to Others: No Duty to Warn:no Physical Aggression / Violence:No  Access to Firearms a concern: No  Gang Involvement:No    Subjective:    Patient in session today focusing more on her anxiety, depression, frustration, and self-esteem. Did start medication from med provider recently and feels it may be helping a little bit already. Working on family issues. Did find out that brother's baby is ok and doesn't have the problem that had been mentioned a week ago.  Spent more time with family at brother's home (which was more stressful for patient) this past weekend and dealt with multiple stressors especially not fitting in much and I have so many different views from others in my own family. Worked on this today particularly looking at the negatives but was able to lean more in a positive direction eventually. Energy improving, hoping it will help her more now and into the future. 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 Issues with brother and wife creating some  discomfort for patient and is managing this better. Deciding she needs more sleep at night and plans to adjust her schedule to allow for this, which is a healthy step for this patient. Self-care becoming a little more important for patient which is a noticeable positive for patient. More able to acknowledge some not fitting in with others type of issue versus just busyness and talked some further in session today about this and ways of being more active with others, making new friends, and feeling more encouraged for herself.  Worked well in session today focusing more on what she can change versus cannot, issues related to job search, choosing to work on worrying less and refrain from negative assumptions about herself, decreasing self sabotage, not over blaming herself, acceptance of feeling better about herself even as she is working on her goals in order to move forward.  Continue to work on interruption of negative thoughts especially about herself, in addition to self sabotaging behaviors.  Seems to feel encouraged that she is dealing better with family time that includes some extended family, as evidenced in recent trip to visit brother and his family with her parents.  Seems to be learning that isolating from family does not help her relationships.   Interventions: Cognitive Behavioral Therapy, Solution-Oriented/Positive Psychology, and Ego-Supportive Long term goal: Stabilize anxiety level while increasing ability to function on a daily basis, as evidenced by patient being able to function daily without feeling her anxiety level impedes  her functioning level.Vanessa Doyle term goal: Increase understanding of beliefs and messages that produce worry and anxiety, as Evidenced by patient clearly stating her improved understanding of negative/anxious thought patterns and how they affect how they lead to worry and anxiety. Strategies: 1)Help patient develop reality-based, positive cognitive  messages. 2)Increase daily use of positive self-talk  3) refrain from assuming worst-case scenarios   Diagnosis:   ICD-10-CM   1. Major depressive disorder, recurrent episode, moderate (HCC)  F33.1      Plan: Patient today in session continued working on her anxiety, depression, frustration, self-esteem, and trying to improve her overall mood and overall outlook into the future.  Did see med provider yesterday regarding medications and that seemed helpful for patient.  Felt her session was positive with med provider and hoping medication will help her in moving forward.  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  Less judging of self and others today noticed.  Considering more activities with others including people she has known and in terms of eventually making new friends.  Continue to encourage patient and letting go of self-defeating thoughts and behaviors and practice believing more in herself. Therapist continues to encourage patient and her work on goal-directed behaviors and to be more positive, trying to practice more self affirming behaviors including: Refrain from focusing on perceived weaknesses and instead focus on her strengths, look for more positives versus negatives, stay in contact with supportive people, decrease her procrastination, refrain from assuming worst-case scenarios, challenge and counteract self-doubt, avoid making assumptions about what others may be thinking about her, and recognize the strength she can show when working with goal-directed behaviors to help her move in a direction that best supports her overall improved emotional health as well as behaviors that help her move in a more positive direction into the future.  Vanessa Doyle does recognize some progress and also challenges, while also aware of inconsistency at times and frequent ups and downs however also has noticed some decrease in her times of overreacting and getting easily  tripped up when things do not go as planned.  This improvement is important for patient as she continues to work hard on treatment goals and focus more on her strengths versus perceived weaknesses.  Goal review and progress/challenges noted with patient.  Next appointment within 2 weeks.   [name], LCSW

## 2024-06-22 NOTE — Progress Notes (Deleted)
      [facility] Counselor/Therapist Progress Note  Patient ID: AEMILIA DEDRICK, MRN: [id number],    Date: 06/22/2024  Time Spent: ***   Treatment Type: {[facility] AMB THERAPY [phone]}  Reported Symptoms: ***  Mental Status Exam:  Appearance:   {PSY:22683}     Behavior:  {PSY:21022743}  Motor:  {PSY:22302}  Speech/Language:   {PSY:22685}  Affect:  {PSY:22687}  Mood:  {PSY:31886}  Thought process:  {PSY:31888}  Thought content:    {PSY:[phone]}  Sensory/Perceptual disturbances:    {[phone]}  Orientation:  {PSY:30297}  Attention:  {PSY:22877}  Concentration:  {PSY:361 763 6352}  Memory:  {PSY:[phone]}  Fund of knowledge:   {PSY:361 763 6352}  Insight:    {PSY:361 763 6352}  Judgment:   {PSY:361 763 6352}  Impulse Control:  {PSY:361 763 6352}   Risk Assessment: Danger to Self:  {PSY:22692} Self-injurious Behavior: {PSY:22692} Danger to Others: {PSY:22692} Duty to Warn:{PSY:311194} Physical Aggression / Violence:{PSY:21197} Access to Firearms a concern: {PSY:21197} Gang Involvement:{PSY:21197}  Subjective: ***   Interventions: {[phone]}  Diagnosis:No diagnosis found.  Plan: ***  [name], LCSW

## 2024-06-27 ENCOUNTER — Ambulatory Visit: Admitting: Psychiatry

## 2024-07-04 ENCOUNTER — Ambulatory Visit: Admitting: Psychiatry

## 2024-07-11 ENCOUNTER — Ambulatory Visit: Admitting: Psychiatry

## 2024-07-11 DIAGNOSIS — F411 Generalized anxiety disorder: Secondary | ICD-10-CM | POA: Diagnosis not present

## 2024-07-11 NOTE — Progress Notes (Signed)
 [facility] Counselor/Therapist Progress Note  Patient ID: Vanessa Doyle, MRN: [id number],    Date: 07/11/2024  Time Spent: 55 minutes   Treatment Type: Individual Therapy  Reported Symptoms: anxiety, frustration, depression, less overwhelmedness   Mental Status Exam:  Appearance:   Casual     Behavior:  Appropriate, Sharing, and Motivated  Motor:  Normal  Speech/Language:   Clear and Coherent  Affect:  Depressed and anxious  Mood:  anxious and depressed  Thought process:  goal directed  Thought content:    WNL  Sensory/Perceptual disturbances:    WNL  Orientation:  oriented to person, place, time/date, situation, day of week, month of year, year, and stated date of Dec. 1, 2025.  Attention:  Fair  Concentration:  Good and Fair  Memory:  WNL  Fund of knowledge:   Good  Insight:    Good and Fair  Judgment:   Good and Fair  Impulse Control:  Good and Fair   Risk Assessment: Danger to Self:  No Self-injurious Behavior: No Danger to Others: No Duty to Warn:no Physical Aggression / Violence:No  Access to Firearms a concern: No  Gang Involvement:No   Subjective:    Patient today working further on her anxiety, frustration, self-esteem, and depression. Saw med provider recently and was prescribed Luvox  to help with her depression, some OCD, and anxiety. Patient states she feels the medication may be helping some. Not that all her symptoms are gone, but the symptoms are not quite as strong, as I don't have quite as much depression, anxiety, and OCD. Brought in some notes she made since our ast appt. Feels she is being pulled in multiple directions with some work options and Make no efforts to have appropriate boundaries and makes my situation worse. Worked further on setting limits in session today, being able to say No as needed, so I can have the time and energy I need to look for more permanent job for myself. Followed through in her plans for working to get job and  not just doing side work. Seems to have some more confidence in herself and she also notes that her confidence  has improved some. More hopeful today and looking to the future. Trying to set healthier limits. Energy some better over all. 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 Situation with brother and his wife is better and less stressed. Paying more attention to getting enough sleep at night. More in tune with her own self-care which is a positive for her. Working to decrease her worrying, decreasing her negative feelings about herself, decreasing self-sabotage, refraining from overly blaming herself, does seek validation from others until she can work more on giving it to herself, improve her feelings about herself to be more positive and she displayed some of that today.   Interventions: Cognitive Behavioral Therapy, Solution-Oriented/Positive Psychology, and Ego-Supportive Long term goal: Stabilize anxiety level while increasing ability to function on a daily basis, as evidenced by patient being able to function daily without feeling her anxiety level impedes her functioning level.[name] term goal: Increase understanding of beliefs and messages that produce worry and anxiety, as Evidenced by patient clearly stating her improved understanding of negative/anxious thought patterns and how they affect how they lead to worry and anxiety. Strategies: 1)Help patient develop reality-based, positive cognitive messages. 2)Increase daily use of positive self-talk  3) refrain from assuming worst-case scenarios   Diagnosis:   ICD-10-CM   1.  Generalized anxiety disorder  F41.1      Plan:    Patient today working well in session continue to focus on her anxiety, self-defeating thoughts, depression, frustration, self-esteem, and working on improving her overall mood and outlook going forward.  Has noted some improvement in the way she views herself, not as judgmental of  herself. Upbeat some that she feels her meds are helping some and is hopeful that will continue as she continues to do her part if working with strategies that can help her achieve her goals. Therapist does continue to encourage patient in her work on goal-directed behaviors and being more positive with herself, trying to practice more self affirming behaviors including: Refrain from focusing on perceived weaknesses and instead focus on her strengths, look for more positives versus negatives, stay in contact with supportive people, decrease her procrastination, refrain from assuming worst-case scenarios, challenge and counteract self-doubt, avoid making assumptions about what others may be thinking about her, and recognize the strength she shows when working with goal-directed behaviors to help her move in a direction that best supports her overall improved emotional health as well as the behaviors that tend to move her in a more positive direction going forward.  Ronnald Lard does recognize some of her progress and also challenges, while there is some inconsistency at times and frequent ups and downs, she has also noticed some decrease in her times of overreacting and getting easily tripped up when things do not go as planned. This improvement is important for patient as she continues to work hard on her treatment goals and focus more on her strengths versus her perceived weaknesses.  Goal review and progress/challenges noted with patient.  Next appointment within 1 to 2 weeks.   [name], LCSW

## 2024-07-13 ENCOUNTER — Ambulatory Visit: Admitting: Psychiatry

## 2024-07-24 ENCOUNTER — Ambulatory Visit (HOSPITAL_COMMUNITY): Payer: Self-pay

## 2024-07-25 ENCOUNTER — Ambulatory Visit: Admitting: Psychiatry

## 2024-07-25 NOTE — Progress Notes (Signed)
Appt cancelled due to illness.

## 2024-08-01 ENCOUNTER — Ambulatory Visit: Admitting: Psychiatry

## 2024-08-01 ENCOUNTER — Ambulatory Visit: Admitting: Physician Assistant

## 2024-08-01 ENCOUNTER — Encounter: Payer: Self-pay | Admitting: Physician Assistant

## 2024-08-01 DIAGNOSIS — F3341 Major depressive disorder, recurrent, in partial remission: Secondary | ICD-10-CM | POA: Diagnosis not present

## 2024-08-01 DIAGNOSIS — F99 Mental disorder, not otherwise specified: Secondary | ICD-10-CM

## 2024-08-01 DIAGNOSIS — F411 Generalized anxiety disorder: Secondary | ICD-10-CM

## 2024-08-01 DIAGNOSIS — F422 Mixed obsessional thoughts and acts: Secondary | ICD-10-CM | POA: Diagnosis not present

## 2024-08-01 DIAGNOSIS — F5105 Insomnia due to other mental disorder: Secondary | ICD-10-CM | POA: Diagnosis not present

## 2024-08-01 MED ORDER — FLUVOXAMINE MALEATE 100 MG PO TABS: 100.0000 mg | ORAL_TABLET | Freq: Every day | ORAL | 1 refills | Status: AC

## 2024-08-01 NOTE — Progress Notes (Signed)
 "     [facility] Med Check  Patient ID: Vanessa Doyle,  MRN: [id number]  PCP: Rexanne Ingle, MD  Date of Evaluation: 08/01/2024 Time spent:20 minutes  Chief Complaint:  Chief Complaint   Depression; Follow-up; Anxiety; Insomnia    HISTORY/CURRENT STATUS: HPI  For 6 week med check.   We started Luvox  6 weeks ago.  She has dry mouth from it, but otherwise no SE.  She had a little dizziness at first but no issues now.    Has felt better as far as depression and anxiety go.  Maybe about 50 % improvement.  She is able to enjoy things.  Energy and motivation are good.  Work is going ok.   No extreme sadness, tearfulness, or feelings of hopelessness.  Sleeps ok most of the time. ADLs and personal hygiene are normal.  No changes in concentration, making decisions, or memory.  Appetite has not changed.  Still having obsessing thoughts but that might be slightly better as well.  She's at least able to catch herself and not allow the ruminating thoughts to be too bad.  No mania, delirium, AH/VH.  No SI/HI.  Individual Medical History/ Review of Systems: Changes? :No   Past medications for mental health diagnoses include: Belsomra  worked the best, Buspar , Temazepam   Allergies: Patient has no known allergies.  Current Medications: Current Medications[1] Medication Side Effects: dry mouth  Family Medical/ Social History: Changes? No  MENTAL HEALTH EXAM:  There were no vitals taken for this visit.There is no height or weight on file to calculate BMI.  General Appearance: Casual and Well Groomed  Eye Contact:  Good  Speech:  Clear and Coherent and Normal Rate  Volume:  Normal  Mood:  Euthymic  Affect:  Congruent  Thought Process:  Goal Directed and Descriptions of Associations: Circumstantial  Orientation:  Full (Time, Place, and Person)  Thought Content: Logical   Suicidal Thoughts:  No  Homicidal Thoughts:  No  Memory:  WNL  Judgement:  Good  Insight:  Good  Psychomotor  Activity:  Normal  Concentration:  Concentration: Good  Recall:  Good  Fund of Knowledge: Good  Language: Good  Assets:  Communication Skills Desire for Improvement Financial Resources/Insurance Housing Transportation Vocational/Educational  ADL's:  Intact  Cognition: WNL  Prognosis:  Good   DIAGNOSES:    ICD-10-CM   1. Mixed obsessional thoughts and acts  F42.2     2. Insomnia due to other mental disorder  F51.05    F99     3. Recurrent major depression in partial remission  F33.41      Receiving Psychotherapy: Yes  with Marval Bunde, LCSW  RECOMMENDATIONS:   PDMP reviewed.  No controlled substances. I provided approximately  20  minutes of face to face time during this encounter, including time spent before and after the visit in records review, medical decision making, counseling pertinent to today's visit, and charting.   She is responding well to the Luvox  but I recommend increasing the dose.  Pros and cons were discussed and she accepts.  Increase Luvox  50 mg to 1.5 pills daily for 10 to 14 days (or whenever she runs out of the current supply of 50 mg) and then increase to 100 mg nightly. Continue therapy with Marval Bunde, LCSW. Return in 2 months.  Verneita Cooks, PA-C     [1]  Current Outpatient Medications:    acetaminophen  (TYLENOL ) 325 MG tablet, Take 2 tablets (650 mg total) by mouth every 6 (six)  hours as needed for fever or mild pain., Disp: , Rfl:    fluvoxaMINE  (LUVOX ) 100 MG tablet, Take 1 tablet (100 mg total) by mouth at bedtime., Disp: 30 tablet, Rfl: 1   ibuprofen  (ADVIL ) 200 MG tablet, Take 200 mg by mouth every 6 (six) hours as needed., Disp: , Rfl:    Melatonin 10 MG CAPS, Take 10 capsules by mouth at bedtime as needed., Disp: 30 capsule, Rfl: 0   Vitamin D , Ergocalciferol , (DRISDOL ) 1.25 MG (50000 UNIT) CAPS capsule, Take 1 capsule (50,000 Units total) by mouth every 7 (seven) days., Disp: 4 capsule, Rfl: 0  "

## 2024-08-01 NOTE — Progress Notes (Signed)
 "       [facility] Counselor/Therapist Progress Note  Patient ID: Vanessa Doyle, MRN: [id number],    Date: 08/01/2024  Time Spent: 53 minutes   Treatment Type: Individual Therapy  Reported Symptoms: anxiety, frustration, depression decreased, overwhelmedness some better    Mental Status Exam:  Appearance:   Casual     Behavior:  Appropriate, Sharing, and Motivated  Motor:  Normal  Speech/Language:   Clear and Coherent  Affect:  Depressed and anxious but depression improved some  Mood:  anxious and depression improved  Thought process:  goal directed  Thought content:    WNL  Sensory/Perceptual disturbances:    WNL  Orientation:  oriented to person, place, time/date, situation, day of week, month of year, year, and stated date of Dec. 22, 2025  Attention:  Fair  Concentration:  Fair  Memory:  WNL  Fund of knowledge:   Good  Insight:    Good and Fair  Judgment:   Good  Impulse Control:  Good   Risk Assessment: Danger to Self:  No Self-injurious Behavior: No Danger to Others: No Duty to Warn:no Physical Aggression / Violence:No  Access to Firearms a concern: No  Gang Involvement:No   Subjective:  Patient in session today continuing to work on her anxiety, self-esteem, frustration, overwhelmedness, and some depression.  Still on the Luvox  and it is being gradually increased per her med provider working it up to 100mg . Patient feels it is helping some with her anxiety and OCD, is more aware of her cyclical thinking.  Easier to get started on tasks, which she feels the medication is helping. Depression nor anxiety not quite as bad and feel more motivated. Petty things don't bother me as much. Still  working to have healthier boundaries to make my personal situation better. Continues her work on healthier boundaries and saying no when needed. Energy better. Working to set healthy boundaries.Working further on efforts towards getting a job and not just settling for  anything. Improved self-confidence. Overall more hopeful and continues to work on family situations and other stressors including her own self-esteem and well-being. Confidence improving some. Feels relationship with brother and sister is improving some. 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 Working to improve sleep pattern, as part of her self-care. Has a few days right now off from her part-time work and trying to decrease negative thoughts about herself, her worrying, decreasing self-sabotage, refraining from overly blaming herself.  Interventions: Cognitive Behavioral Therapy, Solution-Oriented/Positive Psychology, and Ego-Supportive Long term goal: Stabilize anxiety level while increasing ability to function on a daily basis, as evidenced by patient being able to function daily without feeling her anxiety level impedes her functioning level.Vanessa Doyle term goal: Increase understanding of beliefs and messages that produce worry and anxiety, as Evidenced by patient clearly stating her improved understanding of negative/anxious thought patterns and how they affect how they lead to worry and anxiety. Strategies: 1)Help patient develop reality-based, positive cognitive messages. 2)Increase daily use of positive self-talk  3) refrain from assuming worst-case scenarios    Diagnosis:   ICD-10-CM   1. Generalized anxiety disorder  F41.1      Plan:  Patient working in session today focusing on her growth, in setting boundaries for myself, being able to say no as needed, and better managing depression and frustration. Self-esteem improving some. Not as judgmental of myself nor others. Outlook seems better and patient confirms this, although it  is a gradual process. Encouraged by some of her thinking that feels more clear. Interrupting some of her overthinking is/has been very hopeful for patient. Continue to encourage patient and  staying more focused on her goals and being more positive with herself, trying to practice more self affirming behaviors including: Refrain from focusing on perceived weaknesses and instead focus on her strengths, look for more positives versus negatives, stay in contact with supportive people, decrease her procrastination, refrain from assuming worst-case scenarios, challenge and counteract self-doubt, avoid making assumptions about what others may be thinking about her, and realize the strengths she shows when she works with goal-directed behaviors to help her move in a direction that best supports her overall improved emotional health and wellbeing.  Vanessa Doyle recognizes her progress but also her challenges, and feels there is some inconsistency at times when she has some frequent ups and downs but also notices some decrease in her overreacting and getting easily tripped up when things do not go as planned nor as she hoped for.  This improvement is encouraging for patient as she works hard on her treatment goals and focuses more on her strengths versus perceived weaknesses and inadequacies.  Goal review and progress/challenges noted with patient.  Next appointment within 1 to 2 weeks.   Vanessa Bunde, LCSW                   "

## 2024-08-08 ENCOUNTER — Ambulatory Visit: Admitting: Psychiatry

## 2024-08-08 DIAGNOSIS — F411 Generalized anxiety disorder: Secondary | ICD-10-CM | POA: Diagnosis not present

## 2024-08-08 NOTE — Progress Notes (Signed)
 "       [facility] Counselor/Therapist Progress Note  Patient ID: Vanessa Doyle, MRN: [id number],    Date: 08/08/2024  Time Spent: 55 minutes  Treatment Type: Individual Therapy  Reported Symptoms:  anxiety, frustration, depression decreased, overwhelmedness decreased some    Mental Status Exam:  Appearance:   Casual     Behavior:  Appropriate, Sharing, and Motivated  Motor:  Normal  Speech/Language:   Clear and Coherent  Affect:  anxious  Mood:  anxious  Thought process:  goal directed  Thought content:    Obsessions, Rumination, and it has decreased some  Sensory/Perceptual disturbances:    WNL  Orientation:  oriented to person, place, time/date, situation, day of week, month of year, year, and stated date of Dec. 29, 2025  Attention:  Good  Concentration:  Good  Memory:  WNL  Fund of knowledge:   Good  Insight:    Good  Judgment:   Good  Impulse Control:  Good   Risk Assessment: Danger to Self:  No Self-injurious Behavior: No Danger to Others: No Duty to Warn:no Physical Aggression / Violence:No  Access to Firearms a concern: No  Gang Involvement:No   Subjective:   Patient in session today working further on her anxiety, frustrations, self-esteem, overwhelmedness, and some depression. Holidays did have some challenges within family and out of her routine, which patient shared and talked through in session today. Some challenges personally and within family, especially when family has been visiting during holidays. 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 Is concerned, but not spiraling nor making me panic, and I am working at this, explaining ways she is better managing this stress although not perfect which I encouraged her to let go of and she does seem to be using some new and improved skills in managing this stress. Continues to feel the Luvox  is helping her. Trying to look for more positives and not  overly focus on negatives. Anxiety and OCD has decreased, and she is managing it better particularly in trying to find employment. Cyclical thinking decreasing and I'm better at interrupting it. Feels her med is helping her. Overall depression and anxiety have decreased some. Motivation improved. Really setting limits on overstressing and decreasing her stress and particularly thing out of her control. Self-confidence improving. Not bothered as much by petting things. Focusing on healthier boundaries and being able to say no without feeling bad. Hoping to get job soon, and is working in a more motivated/self-caring way on this now. Continues [employer] classes. Self-esteem improving gradually some. Family relationships with brother, parents, and sister-in-law healthier. Less self-blame and not vibrating all the time with anxiety. Leaning more towards hopefulness.    Interventions: Cognitive Behavioral Therapy, Solution-Oriented/Positive Psychology, and Ego-Supportive Long term goal: Stabilize anxiety level while increasing ability to function on a daily basis, as evidenced by patient being able to function daily without feeling her anxiety level impedes her functioning level.Vanessa Doyle term goal: Increase understanding of beliefs and messages that produce worry and anxiety, as Evidenced by patient clearly stating her improved understanding of negative/anxious thought patterns and how they affect how they lead to worry and anxiety. Strategies: 1)Help patient develop reality-based, positive cognitive messages. 2)Increase daily use of positive self-talk  3) refrain from assuming worst-case scenarios   Diagnosis:   ICD-10-CM   1. Generalized anxiety disorder  F41.1      Plan:  Patient showing some added self-confidence. Trying  to not get so overwhelmed. Taking thing a little more in stride. Practicing saying no more as needed. Setting healthier boundaries for herself. Able to say no more as she  needs to. Seeing her progress. Better managing frustration. Outlook is healthier as she is decreasing her self-negating. Interrupts her overthinking. Thinking more clear and is more motivated. Therapist continues to encourage patient in her focus and work on treatment goals and being more positive with herself, and following through on practicing more self affirming behaviors including: Refrain from focusing on perceived weaknesses and try instead of focusing on her strengths, look for more positives versus negatives, stay in contact with supportive people, decrease her procrastination, refrain from assuming worst-case scenarios, challenge and counteract self-doubt, avoid making assumptions about what others may be thinking about her, and realize the strength she shows when she works with goal-directed behaviors to help her move in a direction that best supports her overall improved emotional health and wellbeing now and into the future.  Vanessa Doyle does recognize progress she has made and also her challenges, and notes that at times there is some inconsistency in her behavior and times when she has some frequent ups and downs but also notices that she has decreased some and her overreacting and getting very emotionally reactive when tripped up and things do not go as planned nor as she had hoped for originally.  This improvement is definitely encouraging for patient as she continues to work on treatment goals and focuses more on her strengths versus perceived inadequacies or weaknesses.  Goal review and progress/challenges noted with patient.  Next appointment within 1 to 2 weeks.   [name], LCSW                   "

## 2024-08-19 ENCOUNTER — Ambulatory Visit: Admitting: Psychiatry

## 2024-08-19 DIAGNOSIS — F411 Generalized anxiety disorder: Secondary | ICD-10-CM | POA: Diagnosis not present

## 2024-08-19 NOTE — Progress Notes (Signed)
 "       [facility] Counselor/Therapist Progress Note  Patient ID: Vanessa Doyle, MRN: [id number],    Date: 08/19/2024  Time Spent: 55 minutes   Treatment Type: Individual Therapy  Reported Symptoms:    Anxiety improving,  frustration, overwhelmedness decreasing some, depression decreasing    Mental Status Exam:  Appearance:   Casual     Behavior:  Appropriate, Sharing, and Motivated  Motor:  Normal  Speech/Language:   Clear and Coherent  Affect:  Some depression and anxiety  Mood:  anxious and some depression  Thought process:  goal directed  Thought content:    WNL  Sensory/Perceptual disturbances:    WNL  Orientation:  oriented to person, place, time/date, situation, day of week, month of year, year, and stated date of Jan. 9, 2026  Attention:  Good  Concentration:  Good  Memory:  WNL  Fund of knowledge:   Good  Insight:    Good  Judgment:   Good  Impulse Control:  Good   Risk Assessment: Danger to Self:  No Self-injurious Behavior: No Danger to Others: No Duty to Warn:no Physical Aggression / Violence:No  Access to Firearms a concern: No  Gang Involvement:No   Subjective:   Patient today working on her anxiety, managing overwhelmedness, working on her self-esteem, and some depression. Very concerned about all the world violence and conflict but is setting healthier balance in what she watches online. Processed some of her concerns and also affirming her need to keep setting limits with this. Still working part time at [employer]. Friend let her know about a job possibility in [place], [place] and patient is going to apply for it as it does match up with some of her skill-set. Reports less internal chatter in my brain which was negative. My perspective tending to be more positive. Not spiraling nor panic. Looking more for the positives in herself and others. Her cyclical thinking is definitely decreasing as she continues to interrupt it. Less depression and  less anxiety. Gaining more self-confidence. Noticing I'm tending to have more full thoughts which helps me follow through better on things and better express my thoughts. Motivation improving. Trying to take better care of myself as well. More self-improving and more grounded. Able to say no as needed without guilt or second-guessing herself. Noticing that she feels more hope and less self-blame   Interventions: Cognitive Behavioral Therapy, Solution-Oriented/Positive Psychology, and Ego-Supportive Long term goal: Stabilize anxiety level while increasing ability to function on a daily basis, as evidenced by patient being able to function daily without feeling her anxiety level impedes her functioning level.[name] term goal: Increase understanding of beliefs and messages that produce worry and anxiety, as Evidenced by patient clearly stating her improved understanding of negative/anxious thought patterns and how they affect how they lead to worry and anxiety. Strategies: 1)Help patient develop reality-based, positive cognitive messages. 2)Increase daily use of positive self-talk  3) refrain from assuming worst-case scenarios   Diagnosis:   ICD-10-CM   1. Generalized anxiety disorder  F41.1      Plan:  Patient today working well in session, noting and talking through her challenges and also more of the positives in her life. Much less overwhelmedness. Outlook is healthier. Continue to encourage patient in her work and focus on treatment goals including: Being more positive with herself, refrain from focusing on perceived weaknesses and try instead to focus on her strengths, look for more positives versus negatives, stay in contact with supportive  people, decrease her procrastination, refrain from assuming worst-case scenarios, challenge and counteract self-doubt, avoid making assumptions about what others may be thinking about her, and recognize the strength she shows when working with  goal-directed behaviors helping her move in a direction that better supports her overall improved emotional health and wellbeing.  Brendalyn Vallely does recognize the progress she has made and also the challenges she faces, and does note some inconsistency in her behavior at times when she has frequent ups and downs but also reports today how she has improved her management of the frequent ups and downs to where they have decreased.  Also notices that she is not overreacting quite as much and getting very emotionally volatile when tripped up when things do not go as planned or as she had hoped for originally.  This is definite improvement for patient and she is feeling some encouragement as she continues to work on treatment goals and focus more on her strengths versus perceived weaknesses or inadequacies.  Goal review and progress/challenges noted with patient.  Next appointment within 1 to 2 weeks.   [name], LCSW                   "

## 2024-08-25 ENCOUNTER — Ambulatory Visit: Admitting: Psychiatry

## 2024-08-25 DIAGNOSIS — F411 Generalized anxiety disorder: Secondary | ICD-10-CM

## 2024-08-25 NOTE — Progress Notes (Signed)
 "       [facility] Counselor/Therapist Progress Note  Patient ID: Vanessa Doyle, MRN: [id number],    Date: 08/25/2024  Time Spent: 53 minutes   Treatment Type: Individual Therapy  Reported Symptoms:   anxiety improving, frustration, overwhelmedness decreasing, depression decreasing    Mental Status Exam:  Appearance:   Casual     Behavior:  Appropriate, Sharing, and Motivated  Motor:  Normal  Speech/Language:   Clear and Coherent  Affect:  Anxious, some depression  Mood:  anxious  Thought process:  goal directed  Thought content:    WNL  Sensory/Perceptual disturbances:    WNL  Orientation:  oriented to person, place, time/date, situation, day of week, month of year, year, and stated date of Jan. 15, 2026  Attention:  Good  Concentration:  Good  Memory:  WNL  Fund of knowledge:   Good  Insight:    Good  Judgment:   Good  Impulse Control:  Good   Risk Assessment: Danger to Self:  No Self-injurious Behavior: No Danger to Others: No Duty to Warn:no Physical Aggression / Violence:No  Access to Firearms a concern: No  Gang Involvement:No   Subjective:  Patient today working further on her anxiety, better management of overwhelmedness, working on her self-esteem, and some depression. Is working to have a different routine and really stick with it. Trying to really  figure out what helps and what does not help in terms of a routine, and go with that. Also trying to get into a more routine in my getting up  in the morning. Gaining more insight into her habits and routines looking at what helps and what needs to be changed. Looking at structuring her time better with certain things that she likes being a part of her life regularly including: barre exercise classes, attending her church, some interaction with friends/family and wanting to be more careful in planning her schedule. 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 Processing more of her concerns/anxiety related to world conflict and violence, while also trying to set healthy limits in her exposure to violence and conflict. Still gaining some self-confidence. No spiraling nor panic. **To interview within couple wks for new job. Continuing to look for the positives within herself and others. Wanting to become more independent financially. Chatter in my brain a little less negative.  Panic definitely decreasing.  Less self blaming.  Trying not to assume the worst in people or situations. Managing my self-criticalness some better. Cyclical thinking decreased.  Overall mood some better.   Interventions: Cognitive Behavioral Therapy, Solution-Oriented/Positive Psychology, and Ego-Supportive Long term goal: Stabilize anxiety level while increasing ability to function on a daily basis, as evidenced by patient being able to function daily without feeling her anxiety level impedes her functioning level.Vanessa Doyle term goal: Increase understanding of beliefs and messages that produce worry and anxiety, as Evidenced by patient clearly stating her improved understanding of negative/anxious thought patterns and how they affect how they lead to worry and anxiety. Strategies: 1)Help patient develop reality-based, positive cognitive messages. 2)Increase daily use of positive self-talk  3) refrain from assuming worst-case scenarios   Diagnosis:   ICD-10-CM   1. Generalized anxiety disorder  F41.1      Plan:  Patient working today showing improving self-esteem, more strength, better discernment and conversations and not taking things overly personal or going down a negative route when nothing negative was  intended.  Able to see more potential positives in her life and not letting new opportunities scare her as much.  Showing more maturity and some of her thought processes and the ways she faces challenges which is significant for this patient.  It does seem that  her medication is helping also.  Less frequent feelings of being overwhelmed.  Sees her outlook is being a little more encouraging versus how she used to feel more discouraged. Therapist continues to encourage patient in her work and focus on treatment goals including: Being more positive with herself, refraining from focusing on perceived weaknesses and try instead to focus on her strengths, looking for more positives versus negatives, staying in contact with supportive people, decrease her procrastination, refrain from assuming worst-case scenarios, challenge and counteract self-doubt, avoid making assumptions about what others may be thinking about her, and recognize the strength that she shows when she works with goal-directed behaviors helping her to move forward in a better direction for her overall improved emotional health and wellbeing into the future.  Vanessa Doyle has made progress and she recognizes this which is a good, positive feeling for her.  She also faces significant challenges and is showing some consistency more recently in her behavior at times better managing some ups and downs which is significant for her.  Also noticing that she is not overreacting as much as she has previously and becoming very emotionally volatile when things do not go as planned or as she had hoped for originally.  Better emotional tolerance.  This is good improvement for patient and she reports feeling some encouragement as she continues to work on treatment goals trying to focus more on her strengths versus perceived weaknesses or inadequacies.  Goal review and progress/challenges noted with patient.  Next appointment within 2 weeks.   Vanessa Bunde, LCSW                   "

## 2024-08-30 ENCOUNTER — Ambulatory Visit: Admitting: Psychiatry

## 2024-08-30 DIAGNOSIS — F411 Generalized anxiety disorder: Secondary | ICD-10-CM | POA: Diagnosis not present

## 2024-08-30 NOTE — Progress Notes (Signed)
 "       [facility] Counselor/Therapist Progress Note  Patient ID: Vanessa Doyle, MRN: [id number],    Date: 08/30/2024  Time Spent: 55 minutes   Treatment Type: Individual Therapy  Reported Symptoms:  anxiety, depression but improved, some overwhelmedness but not panic, frustration  Mental Status Exam:  Appearance:   Casual and Well Groomed     Behavior:  Appropriate, Sharing, and Motivated  Motor:  Normal  Speech/Language:   Clear and Coherent  Affect:  Depressed and anxious  Mood:  anxious and depressed  Thought process:  goal directed  Thought content:    Rumination  Sensory/Perceptual disturbances:    WNL  Orientation:  oriented to person, place, time/date, situation, day of week, month of year, year, and stated date of Jan. 20, 2026  Attention:  Fair  Concentration:  Good  Memory:  WNL  Fund of knowledge:   Good  Insight:    Good  Judgment:   Good  Impulse Control:  Good   Risk Assessment: Danger to Self:  No Self-injurious Behavior: No Danger to Others: No Duty to Warn:no Physical Aggression / Violence:No  Access to Firearms a concern: No  Gang Involvement:No   Subjective:   Patient continues to be less critical of herself, more accepting of others (including family), working to better handle inconveniences, making progress in family relationships, and trying to accept things that are out of her control.  Reminds herself when things don't go as planned, that it is just an inconvenience or a variation of our normal routine. Still doing some photography work. Has upcoming job interview but trying not to over-stress as she talked through that today in session. Positive self-talk. Starting to feel some better about herself and this is a work in progress. Less ruminating but still some that I'm working with. Making gains and feeling good about it.Trying to not corner myself and not be negative before job interview, and struggling some with some overwhelmedness in  trying to figure out my plans going forward. More insight. Working further on her self-esteem. Less people-pleasing, still feeling that she is less negative and trying to make that a habit. Definitely becoming more patient with herself and others. Working on facilities manager boundaries. Wanting to be eventually more reliable and finally independent. Continues managing her tendency to be self-critical. Staying in contact with friends as able. Less overthinking. On the path towards decreasing her overthinking. Has begun keeping a journal to help in sorting our her thoughts. Acknowledged positives with patient and continues goal-directed focus.  Interventions: Cognitive Behavioral Therapy Long term goal: Stabilize anxiety level while increasing ability to function on a daily basis, as evidenced by patient being able to function daily without feeling her anxiety level impedes her functioning level. Short term goal: Increase understanding of beliefs and messages that produce worry and anxiety, as evidenced by patient clearly stating her improved understanding of negative/anxious thought patterns and how they affect how they lead to worry and anxiety.  Strategies: Help people develop reality-based, positive cognitive messages. Increase daily use of positive self-talk. Refrain from assuming worst-case scenario.   Diagnosis:   ICD-10-CM   1. Generalized anxiety disorder  F41.1       Plan: Patient doing quite well in session today working further on her discernment in conversations and relationships and not automatically assuming negative thoughts/reactions.  More self encouraging, showing more maturity, and some decrease in her own self negating.  Some overwhelm noticed but not as frequent as in  previous times.  Feels her medication is helping.  Not as discouraged.  Not as easy to make negative assumptions.  Continue to work with patient on not assuming the worst about herself or others, on  refraining from focusing on perceived weakness and instead trying to focus on her strengths, looking for more positives versus negatives, staying in contact with supportive people, decrease her procrastination, refrain from assuming worst-case scenarios, challenge and counteract self-doubt, and recognize the strength that she shows when she works with goal-directed behaviors to help her to move forward in a better direction for overall improved emotional health, self acceptance, and wellbeing into the future.  Etienne Millward is definitely making progress and she recognizes this which is a good positive feeling for her going forward.  She does also recognize significant challenges and is feeling more consistent in her efforts to work on certain behaviors and manage the ups and downs of life rather than becoming so emotionally volatile when things do not go as planned or as she had hoped for originally.  Less emotional upheaval.  Better emotional balance.  Encouraged her to focus more on her strengths versus her perceived weaknesses.  Goal review and progress/challenges noted with patient.  Next appointment within 2 weeks.   [name], LCSW                   "

## 2024-08-31 ENCOUNTER — Telehealth: Payer: Self-pay | Admitting: Physician Assistant

## 2024-08-31 NOTE — Telephone Encounter (Signed)
 It's highly unlikely that it is caused by the Luvox  since she has been on it for 2 1/2 months with the last dose change about a month ago.  I would recommend she take Zyrtec 10 mg every evening, and yes contact her primary provider about this.  She probably needs  to be seen.  If they recommend she go off Luvox , she will take 50 mg every night for a week and then stop it.  If she is having anaphylaxis though ,of course she will need to stop it abruptly and go to the ER.

## 2024-08-31 NOTE — Telephone Encounter (Signed)
 Patient lvm stating she may have a possible shell fish allergic reaction. Pending further appointments she is unsure if that is factual. Patient experience another episode last night, but had not consumed any shell fish. She would like to discuss if any of her medications may be the cause.  Patient is requesting a call back at your convenience.  Appt 10/03/24 with TH

## 2024-08-31 NOTE — Telephone Encounter (Signed)
 Pt last seen 12/22:  Increase Luvox  50 mg to 1.5 pills daily for 10 to 14 days (or whenever she runs out of the current supply of 50 mg) and then increase to 100 mg nightly.   This is the only medication prescribed by you. She reports a questionable seafood allergy. She had an attack last night and had not had any seafood. She is calling all her providers to see if her sx are possibly the cause of an allergic reaction to meds. She is getting an EpiPen as a precaution.   She described throat swelling, but no trouble breathing, extreme nausea, and HA. She said she took two Benadryl  and sx lessened, but have not completely cleared. She said she still feels like there is some obstruction, but is moving air. HA is still present.

## 2024-08-31 NOTE — Telephone Encounter (Signed)
Recommendations reviewed with patient.

## 2024-09-05 ENCOUNTER — Ambulatory Visit: Admitting: Psychiatry

## 2024-09-05 DIAGNOSIS — F411 Generalized anxiety disorder: Secondary | ICD-10-CM

## 2024-09-05 NOTE — Progress Notes (Signed)
"   °      [facility] Counselor/Therapist Progress Note  Patient ID: Vanessa Doyle, MRN: [id number],    Date: 09/05/2024  Time Spent: 50 minutes   Treatment Type: Individual Therapy  Virtual Visit via Telehealth Note: MyChartVideo  (started session with video til we had tech issues and had to complete session with audio) Connected with patient by a telemedicine/telehealth application, with their informed consent, and verified patient privacy and that I am speaking with the correct person using two identifiers. I discussed the limitations, risks, security and privacy concerns of performing psychotherapy and the availability of in person appointments. I also discussed with the patient that there may be a patient responsible charge related to this service. The patient expressed understanding and agreed to proceed. I discussed the treatment planning with the patient. The patient was provided an opportunity to ask questions and all were answered. The patient agreed with the plan and demonstrated an understanding of the instructions. The patient was advised to call  our office if  symptoms worsen or feel they are in a crisis state and need immediate contact.   Therapist Location: office Patient Location: home   Reported Symptoms: anxiety, depression, overwhelmedness, frustration, and all are improving  Mental Status Exam:  Appearance:   Casual     Behavior:  Appropriate, Sharing, and Motivated  Motor:  Normal  Speech/Language:   Clear and Coherent  Affect:  Anxious, depressed but improving  Mood:  anxious and depressed  Thought process:  goal directed  Thought content:    WNL  Sensory/Perceptual disturbances:    WNL  Orientation:  oriented to person, place, time/date, situation, day of week, month of year, year, and stated date of Jan. 26, 2026  Attention:  Good  Concentration:  Fair  Memory:  WNL  Fund of knowledge:   Good  Insight:    Good  Judgment:   Good  Impulse Control:  Good    Risk Assessment: Danger to Self:  No Self-injurious Behavior: No Danger to Others: No Duty to Warn:no Physical Aggression / Violence:No  Access to Firearms a concern: No  Gang Involvement:No   Subjective: Patient today being seen virtually due to inclement weather with snow and ice. She reports symptoms of anxiety and depression which are improving. Does feel her medication is helping and not any side effects at this point. Has job interview today and is trying to be hopeful. Discussed her anxiety and ways she is trying to remain calm and adaptable and not so anxious. Working on staying present and grounded.  Self-esteem improving, motivation improving, anxiety improving. Attitude reflecting more belief in herself and less self-criticizing. Discussed strategies to help patient hang onto her gains and continue to move in a positive direction.   Interventions: Cognitive Behavioral Therapy, Solution-Oriented/Positive Psychology, and Ego-Supportive   Diagnosis:   ICD-10-CM   1. Generalized anxiety disorder  F41.1       Plan:  Patient recognizing progress and some challenges and continues working to move in a positive direction. Noticing her improvements as she works on her goal directed behavior. Still struggling some with procrastination but that is improving. Noticeably more positive and motivated, feeling better about herself and changes she has made.   Goal review and progress/challenges noted with patient.  Next appt within 2 weeks.   [name], LCSW                   "

## 2024-09-08 ENCOUNTER — Ambulatory Visit: Payer: Self-pay | Admitting: Allergy

## 2024-09-08 ENCOUNTER — Other Ambulatory Visit: Payer: Self-pay

## 2024-09-08 ENCOUNTER — Encounter: Payer: Self-pay | Admitting: Allergy

## 2024-09-08 VITALS — BP 124/86 | HR 82 | Temp 98.1°F | Resp 20 | Ht 64.25 in | Wt 227.0 lb

## 2024-09-08 DIAGNOSIS — T783XXA Angioneurotic edema, initial encounter: Secondary | ICD-10-CM

## 2024-09-08 DIAGNOSIS — J3089 Other allergic rhinitis: Secondary | ICD-10-CM | POA: Diagnosis not present

## 2024-09-08 DIAGNOSIS — T7840XA Allergy, unspecified, initial encounter: Secondary | ICD-10-CM

## 2024-09-08 MED ORDER — FAMOTIDINE 20 MG PO TABS: 20.0000 mg | ORAL_TABLET | Freq: Two times a day (BID) | ORAL | 2 refills | Status: AC

## 2024-09-08 NOTE — Patient Instructions (Addendum)
 Allergic reaction? Etiology unclear Avoid seafood and tree nuts for now.  Keep track of episodes and take pictures. Write down what you had done/eaten during flares.   Start zyrtec (cetirizine) 10mg  twice a day. If symptoms are not controlled or causes drowsiness let us  know. Start Pepcid  (famotidine ) 20mg  twice a day.  Avoid the following potential triggers: alcohol, tight clothing, NSAIDs, hot showers and getting overheated. Get bloodwork. We are ordering labs, so please allow 1-2 weeks for the results to come back. With the newly implemented Cures Act, the labs might be visible to you at the same time that they become visible to me. However, I will not address the results until all of the results are back, so please be patient.   For mild symptoms you can take over the counter antihistamines (zyrtec 10mg  to 20mg ) and monitor symptoms closely.  If symptoms worsen or if you have severe symptoms including breathing issues, throat closure, significant swelling, whole body hives, severe diarrhea and vomiting, lightheadedness then use epinephrine and seek immediate medical care afterwards. Emergency action plan given.  Follow up in 1 month or sooner if needed.

## 2024-09-08 NOTE — Progress Notes (Signed)
 "  New Patient Note  RE: [name] MRN: [id number] DOB: [birth year] Date of Office Visit: 09/08/2024  Consult requested by: Stephane Leita DEL, MD Primary care provider: Rexanne Ingle, MD  Chief Complaint: Establish Care (Swelling 2 episodes throat, mouth and hand tingling has epipen and takes zyrtec 10mg  bid and benadryl  prn. 1st episode after shellfish 2nd after nuts. Labs drawn for alpha gal pending results she does not thinks its that she is vegetarian going on almost 3 years)  History of Present Illness: I had the pleasure of seeing Vanessa Doyle for initial evaluation at the [facility] on 09/08/2024. She is a 24 y.o. female, who is referred here by Stephane Leita DEL, MD for the evaluation of allergic reactions.  Discussed the use of AI scribe software for clinical note transcription with the patient, who gave verbal consent to proceed.    She has experienced two major allergic reactions in the past two weeks, characterized by swelling of the throat and lips, tingling and slight swelling in the hands, and gastrointestinal distress with diarrhea. The first episode occurred after consuming shrimp, and the second after eating a mix of nuts including macadamia, cashew, pecans, Brazil nuts, and possibly almonds. Both episodes began approximately one hour after ingestion of the suspected allergens.  She manages her symptoms with Zyrtec twice daily and Benadryl  as needed, which have prevented the need for EpiPen use. Swelling typically subsides in about twelve hours, slightly less with Benadryl . She has also experienced smaller reactions, including gastrointestinal symptoms and mild throat swelling, on three occasions between the major episodes.  She started taking a birth control medication, around the time of the second episode but discontinued it after consulting her OB GYN. She has been on fluoxetine for OCD for about a month and a half, which she started in early December,  prior to the onset of these reactions.   Her family history includes a brother with a shellfish allergy. She has no personal history of allergic reactions, asthma, or inhaler use, but does experience fall allergies with symptoms of congestion, watery eyes, and sneezing, for which she occasionally takes over-the-counter medication.  She has been avoiding shellfish and nuts since the episodes and has not had any prior issues with these foods. She does not consume red meat but is exposed to it through family meals.       Swelling started about 2 weeks ago. Mainly occurs on her face/hands. Individual swelling episodes last about 12 hours. Associated symptoms include: none.  Frequency of episodes: 2 episodes. First episode 2 weeks ago and another a week after. 1 milder episode between episodes and 2 smaller after 2nd episode.  First one occurred after eating shellfish - shrimp (8-10 pieces) and symptoms occurred about 1 hour afterwards. She had no reactions to shellfish previously. Second occurred after eating macadamia, cashews, pecans, brazils nuts and almonds. Symptoms occurred about 1 hr afterwards.   Denies any fevers, chills, changes in foods, personal care products or recent infections. She has tried the following therapies: antihistamines with good benefit.   Systemic steroids: yes. Currently on zyrtec 10mg  BID.  Previous work up includes: alpha gal lab is still pending. 2026 labs - Cbc diff, cmp, tsh unremarkable. Previous history of swelling: no. Family history of angioedema: no. Patient is up to date with the following cancer screening tests: physical exam. Ace-inhibitor use: no   Dietary History: patient has been eating other foods including milk, eggs, peanut, treenuts, sesame, shellfish,  fish, soy, wheat, fruits and vegetables.  09/02/2024 PCP visit:   Vanessa Doyle, a 24 year old female, came in for routine physical. She reports recurrent allergic reactions that started 1 week  ago. She described swelling of her tongue and throat, but denied hives, skin rash, shotrness of breath or wheezing. She had some nausea with the most recent episode. Prednisone given last week resolved the initial episoe, though she'd had two more since she stopped taking it. Benadryl  helps partially. She was advised earlier this week to start BID Zyrtec. She has an appointment with an allergist next week. No clear food triggers, she ate shellfish the first time then nuts the secondn time. She denies fever, headache, sore throat, or respiratory symptoms. She had a faintly positive strep test last week but never developed a sore throat or fever and elected not to start antibiotids.   Recurrent episodes of swelling in tongue and throat, more recently with nausea. No hives. No clear food triggers identified; initial suspicion of shellfish, now avoiding all possible allergens. Symptoms resolved with Benadryl  and steroids after first episode, recurred recently. She has an EpiPen.  - Continue Zyrtec twice daily.  - Use Benadryl  as needed for allergic symptoms.  - Order alpha gal panel to rule out meat allergy (hoofed meats, tick-bite related).  - Refer to Allergy and Immunology for evaluation of recurrent allergic reactions.  - Has EpiPen on hand.  Assessment and Plan: Shellie is a 24 y.o. female with: Angioedema, initial encounter Allergic reaction, initial encounter 2 major episodes of facial swelling with diarrhea post shrimp and mixed tree nuts consumption. Previously tolerated with no issues. Some changes in her medications recently as well. 2026 labs cbc diff, cmp, tsh unremarkable. Alpha gal pending but patient follows mainly a pescaterian diet. No prior episodes of swelling. Not on ace-inhibitor.  Etiology unclear Avoid seafood and tree nuts for now. Keep track of episodes and take pictures. Write down what you had done/eaten during flares.  Start zyrtec (cetirizine) 10mg  twice a day. If symptoms  are not controlled or causes drowsiness let us  know. Start Pepcid  (famotidine ) 20mg  twice a day.  Avoid the following potential triggers: alcohol, tight clothing, NSAIDs, hot showers and getting overheated. For mild symptoms you can take over the counter antihistamines (zyrtec 10mg  to 20mg ) and monitor symptoms closely.  If symptoms worsen or if you have severe symptoms including breathing issues, throat closure, significant swelling, whole body hives, severe diarrhea and vomiting, lightheadedness then use epinephrine and seek immediate medical care afterwards. Emergency action plan given. Get bloodwork. If tryptase is elevated will recommend to stop her new luvox  medication.  Other allergic rhinitis Mainly symptomatic in the fall. Skin testing as a child positive to mold and cockroaches. Monitor symptoms. Consider repeat testing in future.  Return in about 4 weeks (around 10/06/2024).  Meds ordered this encounter  Medications   famotidine  (PEPCID ) 20 MG tablet    Sig: Take 1 tablet (20 mg total) by mouth 2 (two) times daily.    Dispense:  60 tablet    Refill:  2   Lab Orders         ANA, IFA (with reflex)         C3 and C4         C1 esterase inhibitor, functional         C1 Esterase Inhibitor         Tryptase         Sedimentation rate  C-reactive protein         Allergen Profile, Shellfish         Allergen Profile, Food-Fish         IgE Nut Prof. w/Component Rflx         Chronic Urticaria PD-BAT      Other allergy screening: Asthma: no Rhino conjunctivitis:  Mainly symptomatic in the fall with congestion, watery eyes, sneezing.  Takes otc antihistamines with good benefit. Had skin testing as a child and was positive to cockroaches and mold per patient report. No prior AIT  Medication allergy: no Hymenoptera allergy: no Urticaria: no Eczema:no History of recurrent infections suggestive of immunodeficency: no  Diagnostics: None.   Past Medical  History: Patient Active Problem List   Diagnosis Date Noted   Vitamin D  deficiency 01/24/2020   Prediabetes 01/24/2020   Class 2 severe obesity with serious comorbidity and body mass index (BMI) of 39.0 to 39.9 in adult 01/24/2020   Insomnia disorder, with non-sleep disorder mental comorbidity, persistent 05/31/2018   Cellulitis 04/13/2018   Difficulty with speech 01/01/2018   Word finding difficulty 01/01/2018   Subjective memory complaints 01/01/2018   Hypotension 01/31/2015   Generalized anxiety disorder 11/24/2014   Migraine without aura and without status migrainosus, not intractable 11/24/2014   Episodic tension type headache 11/24/2014   Past Medical History:  Diagnosis Date   Anxiety    Depression    Episodic tension type headache 11/24/2014   Insomnia    Migraine without aura and without status migrainosus, not intractable 11/24/2014   Migraine without aura and without status migrainosus, not intractable    Vitamin D  deficiency    Past Surgical History: Past Surgical History:  Procedure Laterality Date   WISDOM TOOTH EXTRACTION     Medication List:  Current Outpatient Medications  Medication Sig Dispense Refill   acetaminophen  (TYLENOL ) 325 MG tablet Take 2 tablets (650 mg total) by mouth every 6 (six) hours as needed for fever or mild pain.     cetirizine (ZYRTEC ALLERGY) 10 MG tablet 1 tablet Orally twice a day     EPINEPHrine 0.3 mg/0.3 mL IJ SOAJ injection SMARTSIG:1 Milligram(s) IM Daily     famotidine  (PEPCID ) 20 MG tablet Take 1 tablet (20 mg total) by mouth 2 (two) times daily. 60 tablet 2   fluvoxaMINE  (LUVOX ) 100 MG tablet Take 1 tablet (100 mg total) by mouth at bedtime. 30 tablet 1   ibuprofen  (ADVIL ) 200 MG tablet Take 200 mg by mouth every 6 (six) hours as needed.     No current facility-administered medications for this visit.   Allergies: Allergies[1] Social History: Social History   Socioeconomic History   Marital status: Single    Spouse  name: Not on file   Number of children: Not on file   Years of education: Not on file   Highest education level: Not on file  Occupational History   Not on file  Tobacco Use   Smoking status: Never    Passive exposure: Never   Smokeless tobacco: Never  Vaping Use   Vaping status: Never Used  Substance and Sexual Activity   Alcohol use: No   Drug use: No   Sexual activity: Never  Other Topics Concern   Not on file  Social History Narrative   Good childhood, never abused   dad works with [employer], lobbyist.    Mom is Production Designer, Theatre/television/film at [employer]   Has an older brother.      Bachelors degree  from [employer]   Is a environmental manager, estate agent work, higher education careers adviser.      She lives with her parents.    She enjoys dance, likes to read, works out       Careers Information Officer none   Social Drivers of Health   Tobacco Use: Low Risk (09/08/2024)   Patient History    Smoking Tobacco Use: Never    Smokeless Tobacco Use: Never    Passive Exposure: Never  Financial Resource Strain: Low Risk (06/17/2024)   Overall Financial Resource Strain (CARDIA)    Difficulty of Paying Living Expenses: Not hard at all  Food Insecurity: No Food Insecurity (06/17/2024)   Epic    Worried About Programme Researcher, Broadcasting/film/video in the Last Year: Never true    Ran Out of Food in the Last Year: Never true  Transportation Needs: No Transportation Needs (06/17/2024)   Epic    Lack of Transportation (Medical): No    Lack of Transportation (Non-Medical): No  Physical Activity: Sufficiently Active (06/17/2024)   Exercise Vital Sign    Days of Exercise per Week: 7 days    Minutes of Exercise per Session: 60 min  Stress: Stress Concern Present (06/17/2024)   [employer] of Occupational Health - Occupational Stress Questionnaire    Feeling of Stress: Rather much  Social Connections: Moderately Isolated (06/17/2024)   Social Connection and Isolation Panel    Frequency of Communication with Friends and Family:  Once a week    Frequency of Social Gatherings with Friends and Family: Once a week    Attends Religious Services: More than 4 times per year    Active Member of Clubs or Organizations: Yes    Attends Banker Meetings: More than 4 times per year    Marital Status: Never married  Depression (PHQ2-9): High Risk (06/17/2024)   Depression (PHQ2-9)    PHQ-2 Score: 16  Alcohol Screen: Not on file  Housing: Unknown (06/17/2024)   Epic    Unable to Pay for Housing in the Last Year: No    Number of Times Moved in the Last Year: Not on file    Homeless in the Last Year: No  Utilities: Not At Risk (06/17/2024)   Epic    Threatened with loss of utilities: No  Health Literacy: Not on file   Lives in a house. Smoking: denies Occupation: self-employed Producer, Television/film/video HistorySurveyor, Minerals in the house: no Engineer, Civil (consulting) in the family room: no Carpet in the bedroom: yes Heating: heat pump Cooling: heat pump Pet: no  Family History: Family History  Problem Relation Age of Onset   Hypertension Mother    Obesity Mother    Migraines Father    Sleep apnea Father    Asthma Brother    Food Allergy Brother    Leukemia Maternal Grandmother    Breast cancer Maternal Grandmother    Heart disease Paternal Grandmother    Review of Systems  Constitutional:  Negative for appetite change, chills, fever and unexpected weight change.  HENT:  Negative for congestion and rhinorrhea.   Eyes:  Negative for itching.  Respiratory:  Negative for cough, chest tightness, shortness of breath and wheezing.   Cardiovascular:  Negative for chest pain.  Gastrointestinal:  Positive for diarrhea. Negative for abdominal pain.  Genitourinary:  Negative for difficulty urinating.  Skin:  Negative for rash.  Neurological:  Negative for headaches.    Objective: BP 124/86   Pulse 82   Temp  98.1 F (36.7 C) (Temporal)   Resp 20   Ht 5' 4.25 (1.632 m)   Wt 227 lb  (103 kg)   SpO2 (!) 87%   BMI 38.66 kg/m  Body mass index is 38.66 kg/m. Physical Exam Vitals and nursing note reviewed.  Constitutional:      Appearance: Normal appearance. She is well-developed.  HENT:     Head: Normocephalic and atraumatic.     Right Ear: Tympanic membrane and external ear normal.     Left Ear: Tympanic membrane and external ear normal.     Nose: Nose normal.     Mouth/Throat:     Mouth: Mucous membranes are moist.     Pharynx: Oropharynx is clear.  Eyes:     Conjunctiva/sclera: Conjunctivae normal.  Cardiovascular:     Rate and Rhythm: Normal rate and regular rhythm.     Heart sounds: Normal heart sounds. No murmur heard.    No friction rub. No gallop.  Pulmonary:     Effort: Pulmonary effort is normal.     Breath sounds: Normal breath sounds. No wheezing, rhonchi or rales.  Musculoskeletal:     Cervical back: Neck supple.  Skin:    General: Skin is warm.     Findings: No rash.  Neurological:     Mental Status: She is alert and oriented to person, place, and time.  Psychiatric:        Behavior: Behavior normal.    The plan was reviewed with the patient/family, and all questions/concerned were addressed.  It was my pleasure to see Makala today and participate in her care. Please feel free to contact me with any questions or concerns.  Sincerely,  Orlan Cramp, DO Allergy & Immunology  [facility]  [place] office: [phone] [facility] office: [phone]     [1] No Known Allergies  "

## 2024-09-12 ENCOUNTER — Ambulatory Visit: Admitting: Psychiatry

## 2024-09-12 DIAGNOSIS — F411 Generalized anxiety disorder: Secondary | ICD-10-CM

## 2024-09-12 NOTE — Progress Notes (Signed)
 "       [facility] Counselor/Therapist Progress Note  Patient ID: Vanessa Doyle, MRN: [id number],    Date: 09/12/2024  Time Spent: 45 minutes   Treatment Type: Individual Therapy  Virtual Visit via Telehealth Note: MyChart Video session: Connected with patient by a telemedicine/telehealth application, with their informed consent, and verified patient privacy and that I am speaking with the correct person using two identifiers. I discussed the limitations, risks, security and privacy concerns of performing psychotherapy and the availability of in person appointments. I also discussed with the patient that there may be a patient responsible charge related to this service. The patient expressed understanding and agreed to proceed. I discussed the treatment planning with the patient. The patient was provided an opportunity to ask questions and all were answered. The patient agreed with the plan and demonstrated an understanding of the instructions. The patient was advised to call  our office if  symptoms worsen or feel they are in a crisis state and need immediate contact.   Therapist Location: home office due to snow/ice Patient Location: home    Reported Symptoms: anxiety decreasing some, self-esteem improving some, worrying and overthinking improving, some procrastination decrease, setting boundaries in healthier ways, not overcommitting herself, no panic   Mental Status Exam:  Appearance:   Casual and Neat     Behavior:  Appropriate, Sharing, and Assertive  Motor:  Normal  Speech/Language:   Clear and Coherent  Affect:  anxious  Mood:  anxious and but decreasing anxiety  Thought process:  goal directed  Thought content:    Ruminating decreased  Sensory/Perceptual disturbances:    WNL  Orientation:  oriented to person, place, time/date, situation, day of week, month of year, year, and stated date of Feb. 2, 2026  Attention:  Fair  Concentration:  Good  Memory:  WNL  Fund of  knowledge:   Good  Insight:    Good  Judgment:   Good  Impulse Control:  Good   Risk Assessment: Danger to Self:  No Self-injurious Behavior: No Danger to Others: No Duty to Warn:no Physical Aggression / Violence:No  Access to Firearms a concern: No  Gang Involvement:No   Subjective:  Patient today in pretty good mood and hoping latest interview for job keeps moving in a positive direction.  Trying not to overthink and stay focused on things she is needing to get done. Improved self-talk and encouragement of herself. Appt with allergist last week went well but do not have results yet, some may be related to shellfish. Fewer worries but am some anxious about getting the job, and worried about getting insurance when I reach age 46 on March 13th. Notices that home environment is better with my mood improving. Sleeping some better. Improvement in energy. Sleep is more restful. Review of treatment goals and patient feels she is progressing as evidenced by her behavior and mood.    Interventions: Cognitive Behavioral Therapy, Solution-Oriented/Positive Psychology, and Ego-Supportive Long term goal: Stabilize anxiety level while increasing ability to function on a daily basis, as evidenced by patient being able to function daily without feeling her anxiety level impedes her functioning level. Short term goal: Increase understanding of beliefs and messages that produce worry and anxiety, as evidenced by patient clearly stating her improved understanding of negative/anxious thought patterns and how they affect how they lead to worry and anxiety.  Strategies: 1.Use more reality-based, positive cognitive messages. 2.Increase daily use of positive self-talk. 3.Refrain from assuming worst-case scenario.  Diagnosis:   ICD-10-CM   1. Generalized anxiety disorder  F41.1       Plan:  Patient today noting progress and feeling more encouraged although realizing it is a process. Feeling better  about herself and the work she is putting into her goals to get better results. Named several positive results in session today.Relationships with parents at home is definitely better. Staying on her meds as prescribed. More motivation. Less self-critical. Recognizing self-defeating talk and behaviors and starting to correct them. Less overthinking. Less assuming the worst case scenarios. Journaling is helpful for her in between sessions and encouraged her to continue.  Goal review and progress/challenges noted with patient.  Next appt within 2 weeks.   [name], LCSW                   "

## 2024-09-19 ENCOUNTER — Ambulatory Visit: Admitting: Psychiatry

## 2024-09-26 ENCOUNTER — Ambulatory Visit: Admitting: Psychiatry

## 2024-09-29 ENCOUNTER — Ambulatory Visit: Payer: Self-pay | Admitting: Internal Medicine

## 2024-10-03 ENCOUNTER — Ambulatory Visit: Admitting: Physician Assistant

## 2024-10-03 ENCOUNTER — Ambulatory Visit: Admitting: Psychiatry

## 2024-10-11 ENCOUNTER — Ambulatory Visit: Admitting: Allergy
